# Patient Record
Sex: Male | Born: 1965 | Race: Black or African American | Hispanic: No | Smoking: Former smoker
Health system: Southern US, Community
[De-identification: ages and names within clinical notes are randomized; demographics above are authoritative.]

## PROBLEM LIST (undated history)

## (undated) ENCOUNTER — Ambulatory Visit (HOSPITAL_COMMUNITY): Admission: EM

## (undated) DIAGNOSIS — I509 Heart failure, unspecified: Secondary | ICD-10-CM

## (undated) DIAGNOSIS — I5022 Chronic systolic (congestive) heart failure: Secondary | ICD-10-CM

## (undated) DIAGNOSIS — Q600 Renal agenesis, unilateral: Secondary | ICD-10-CM

## (undated) DIAGNOSIS — N186 End stage renal disease: Secondary | ICD-10-CM

## (undated) DIAGNOSIS — R634 Abnormal weight loss: Secondary | ICD-10-CM

## (undated) DIAGNOSIS — I10 Essential (primary) hypertension: Secondary | ICD-10-CM

## (undated) HISTORY — DX: Heart failure, unspecified: I50.9

---

## 2009-09-23 ENCOUNTER — Emergency Department (HOSPITAL_COMMUNITY): Admission: EM | Admit: 2009-09-23 | Discharge: 2009-09-24 | Payer: Self-pay | Admitting: Emergency Medicine

## 2010-09-04 LAB — POCT I-STAT, CHEM 8
BUN: 24 mg/dL — ABNORMAL HIGH (ref 6–23)
Calcium, Ion: 1.16 mmol/L (ref 1.12–1.32)
Chloride: 107 mEq/L (ref 96–112)
Glucose, Bld: 88 mg/dL (ref 70–99)
HCT: 43 % (ref 39.0–52.0)

## 2010-09-04 LAB — URINE MICROSCOPIC-ADD ON

## 2010-09-04 LAB — URINALYSIS, ROUTINE W REFLEX MICROSCOPIC
Bilirubin Urine: NEGATIVE
Glucose, UA: NEGATIVE mg/dL
Ketones, ur: NEGATIVE mg/dL
pH: 5.5 (ref 5.0–8.0)

## 2019-06-11 ENCOUNTER — Emergency Department (HOSPITAL_COMMUNITY)
Admission: EM | Admit: 2019-06-11 | Discharge: 2019-06-12 | Disposition: A | Discharge status: Home / Self Care | Payer: Self-pay | Attending: Emergency Medicine | Admitting: Emergency Medicine

## 2019-06-11 DIAGNOSIS — J36 Peritonsillar abscess: Secondary | ICD-10-CM | POA: Insufficient documentation

## 2019-06-11 DIAGNOSIS — J029 Acute pharyngitis, unspecified: Secondary | ICD-10-CM

## 2019-06-11 DIAGNOSIS — I10 Essential (primary) hypertension: Secondary | ICD-10-CM | POA: Insufficient documentation

## 2019-06-12 ENCOUNTER — Other Ambulatory Visit: Payer: Self-pay

## 2019-06-12 LAB — GROUP A STREP BY PCR: Group A Strep by PCR: NOT DETECTED

## 2019-06-12 MED ORDER — IBUPROFEN 400 MG PO TABS
800.0000 mg | ORAL_TABLET | Freq: Once | ORAL | Status: AC
  Administered 2019-06-12: 06:00:00 800 mg via ORAL
  Filled 2019-06-12: qty 2

## 2019-06-12 MED ORDER — CLINDAMYCIN HCL 150 MG PO CAPS
450.0000 mg | ORAL_CAPSULE | Freq: Once | ORAL | Status: AC
  Administered 2019-06-12: 06:00:00 450 mg via ORAL
  Filled 2019-06-12: qty 3

## 2019-06-12 MED ORDER — CLINDAMYCIN HCL 150 MG PO CAPS: 450.0000 mg | ORAL_CAPSULE | Freq: Three times a day (TID) | ORAL | 0 refills | Status: DC

## 2019-06-12 NOTE — ED Triage Notes (Signed)
Per pt he started having a sore throat only on left side that started yesterday and has gotten worse today. Pt said no chills, no cough, low grade fever.

## 2019-06-12 NOTE — ED Notes (Signed)
ED Provider at bedside. 

## 2019-06-12 NOTE — ED Provider Notes (Signed)
[facility] EMERGENCY DEPARTMENT Provider Note   CSN: [id number] Arrival date & time: 06/11/19  2333     History Chief Complaint  Patient presents with  . Sore Throat    Phillip Lynch is a 53 y.o. male with a hx of hypertension presents to the Emergency Department complaining of gradual, persistent, progressively worsening left-sided throat pain onset approximately 24 hours ago.  Patient reports trying warm water salt gargle without relief.  He reports it is worsened slightly throughout the day.  He reports the site feels slightly swollen but he is able to eat and drink.  Swallowing is painful but is not difficult.  Patient reports he lost the crown off of his left upper molar several months ago but it has not given him any pain or other symptoms.  No swelling of the gingiva.  Patient denies known sick contacts or Covid contacts.  He reports swallowing makes the symptoms worse and nothing seems to make them better.  He has not taking any medications for pain.  He denies fever, chills, neck pain, neck stiffness, chest pain, nausea, vomiting, diarrhea, weakness, dizziness, syncope.  Patient reports he is not currently medicated for his hypertension as he has not had insurance in some time.  The history is provided by the patient and medical records. No language interpreter was used.       No past medical history on file.  There are no problems to display for this patient.      No family history on file.  Social History   Tobacco Use  . Smoking status: Not on file  Substance Use Topics  . Alcohol use: Not on file  . Drug use: Not on file    Home Medications Prior to Admission medications   Medication Sig Start Date End Date Taking? Authorizing Provider  clindamycin (CLEOCIN) 150 MG capsule Take 3 capsules (450 mg total) by mouth 3 (three) times daily. 06/12/19   Tanijah Morais, Jarrett Soho, PA-C    Allergies    Patient has no allergy information on  record.  Review of Systems   Review of Systems  Constitutional: Negative for chills, fatigue and fever.  HENT: Positive for sore throat. Negative for congestion, dental problem, drooling, ear pain, facial swelling, mouth sores, postnasal drip, rhinorrhea, trouble swallowing and voice change.   Eyes: Negative for pain.  Respiratory: Negative for cough, chest tightness and shortness of breath.   Cardiovascular: Negative for chest pain.  Gastrointestinal: Negative for abdominal pain, nausea and vomiting.  Musculoskeletal: Negative for neck pain and neck stiffness.  Skin: Negative for rash.  Allergic/Immunologic: Negative for immunocompromised state.  Neurological: Negative for facial asymmetry and headaches.  Hematological: Positive for adenopathy.  Psychiatric/Behavioral: The patient is not nervous/anxious.     Physical Exam Updated Vital Signs BP (!) 164/112 (BP Location: Right Arm)   Pulse 97   Temp 99.6 F (37.6 C) (Oral)   Resp 18   SpO2 97%   Physical Exam Vitals and nursing note reviewed.  Constitutional:      General: He is not in acute distress.    Appearance: He is well-developed. He is not diaphoretic.  HENT:     Head: Normocephalic and atraumatic.     Jaw: No trismus.     Right Ear: Tympanic membrane, ear canal and external ear normal.     Left Ear: Tympanic membrane, ear canal and external ear normal.     Nose: Nose normal. No mucosal edema or rhinorrhea.  Mouth/Throat:     Mouth: Mucous membranes are not dry.     Dentition: No dental abscesses.     Pharynx: Uvula midline. Pharyngeal swelling, oropharyngeal exudate and posterior oropharyngeal erythema present. No uvula swelling.     Tonsils: No tonsillar exudate. 1+ on the right. 1+ on the left.      Comments: Uvula midline without swelling.  Retropharyngeal space without swelling.   Eyes:     Conjunctiva/sclera: Conjunctivae normal.  Neck:     Trachea: Phonation normal. No tracheal tenderness.      Meningeal: Brudzinski's sign and Kernig's sign absent.     Comments: Range of motion without pain  No midline or paraspinal tenderness Normal phonation No stridor Handling secretions without difficulty No nuchal rigidity or meningeal signs Cardiovascular:     Rate and Rhythm: Normal rate and regular rhythm.     Pulses:          Radial pulses are 2+ on the right side and 2+ on the left side.  Pulmonary:     Effort: Pulmonary effort is normal. No respiratory distress.     Breath sounds: Normal breath sounds. No stridor. No decreased breath sounds or wheezing.  Abdominal:     General: There is no distension.  Musculoskeletal:        General: Normal range of motion.     Cervical back: Full passive range of motion without pain and normal range of motion. No erythema or rigidity. No spinous process tenderness or muscular tenderness. Normal range of motion.  Lymphadenopathy:     Head:     Right side of head: No submental, submandibular, tonsillar, preauricular, posterior auricular or occipital adenopathy.     Left side of head: Tonsillar adenopathy present. No submental, submandibular, preauricular, posterior auricular or occipital adenopathy.     Cervical: No cervical adenopathy.     Right cervical: No superficial, deep or posterior cervical adenopathy.    Left cervical: No superficial, deep or posterior cervical adenopathy.  Skin:    General: Skin is warm and dry.  Neurological:     Mental Status: He is alert.     Comments: Alert and oriented Moves all extremities without ataxia     ED Results / Procedures / Treatments   Labs (all labs ordered are listed, but only abnormal results are displayed) Labs Reviewed  GROUP A STREP BY PCR    Procedures Procedures (including critical care time)  Medications Ordered in ED Medications  clindamycin (CLEOCIN) capsule 450 mg (has no administration in time range)  ibuprofen (ADVIL) tablet 800 mg (has no administration in time range)     ED Course  I have reviewed the triage vital signs and the nursing notes.  Pertinent labs & imaging results that were available during my care of the patient were reviewed by me and considered in my medical decision making (see chart for details).  Clinical Course as of Jun 11 605  Sun Jun 12, 2019  0605 Hypertension noted.  Patient has a history of same and is not currently medicated given financial constraints.  Discussed importance of close follow-up and establishing primary care follow-up as he is now employed with insurance.  No chest pain or shortness of breath.  No evidence of hypertensive urgency.  BP(!): 164/112 [HM]    Clinical Course User Index [HM] Laurieann Friddle, Gwenlyn Perking   MDM Rules/Calculators/A&P                      Patient  presents to the emergency department with sore throat.  On exam he has erythema and a very mild amount of swelling to the peritonsillar tissue.  No exudate.  Strep test is negative.  Uvula is midline and without swelling.  No swelling to the retropharyngeal space.  Full range of motion of the neck.  Normal phonation and patient is handling secretions without difficulty.  He is able to drink water here in the emergency department and swallow tablets.  Clinical picture is most consistent with peritonsillar cellulitis.  Given the minimal edema, I do not believe he has an abscess which would be amenable to drainage.  This may be early abscess formation.  Patient is afebrile without signs of sepsis.   Given that he is well-appearing and symptoms are mild we will start with clindamycin.  Long discussion with patient about peritonsillar cellulitis and potential for early abscess.  Discussed reasons to return immediately to the emergency department including development of fevers, difficulty swallowing, change in voice, worsening swelling or any other concerns.  Patient does understand that there is potential that the current infection will turn into an abscess  which will need to be drained.   Final Clinical Impression(s) / ED Diagnoses Final diagnoses:  Peritonsillar cellulitis  Sore throat  Hypertension, unspecified type    Rx / DC Orders ED Discharge Orders         Ordered    clindamycin (CLEOCIN) 150 MG capsule  3 times daily     06/12/19 0604           Nguyet Mercer, Jarrett Soho, PA-C 06/12/19 0606    Varney Biles, MD 06/12/19 2315

## 2019-06-12 NOTE — Discharge Instructions (Signed)
1. Medications: Clindamycin, usual home medications 2. Treatment: rest, drink plenty of fluids,  3. Follow Up: Please return to the ER immediately for worsening symptoms, high fevers, difficulty swallowing, voice change or worsening pain.  You were found to be hypertensive today.  Please establish care with a primary care provider to recheck blood pressure and begin medications.

## 2019-07-20 ENCOUNTER — Other Ambulatory Visit: Payer: Self-pay

## 2019-07-20 ENCOUNTER — Inpatient Hospital Stay (HOSPITAL_COMMUNITY)
Admission: EM | Admit: 2019-07-20 | Discharge: 2019-07-22 | DRG: 683 | Disposition: A | Discharge status: Home / Self Care | Payer: Self-pay | Attending: Internal Medicine | Admitting: Internal Medicine

## 2019-07-20 ENCOUNTER — Encounter (HOSPITAL_COMMUNITY): Payer: Self-pay | Admitting: Emergency Medicine

## 2019-07-20 DIAGNOSIS — I160 Hypertensive urgency: Secondary | ICD-10-CM | POA: Diagnosis present

## 2019-07-20 DIAGNOSIS — Q600 Renal agenesis, unilateral: Secondary | ICD-10-CM

## 2019-07-20 DIAGNOSIS — IMO0002 Reserved for concepts with insufficient information to code with codable children: Secondary | ICD-10-CM

## 2019-07-20 DIAGNOSIS — F17200 Nicotine dependence, unspecified, uncomplicated: Secondary | ICD-10-CM | POA: Diagnosis present

## 2019-07-20 DIAGNOSIS — N184 Chronic kidney disease, stage 4 (severe): Secondary | ICD-10-CM

## 2019-07-20 DIAGNOSIS — D539 Nutritional anemia, unspecified: Secondary | ICD-10-CM | POA: Diagnosis present

## 2019-07-20 DIAGNOSIS — N185 Chronic kidney disease, stage 5: Secondary | ICD-10-CM | POA: Diagnosis present

## 2019-07-20 DIAGNOSIS — Z91128 Patient's intentional underdosing of medication regimen for other reason: Secondary | ICD-10-CM

## 2019-07-20 DIAGNOSIS — I10 Essential (primary) hypertension: Secondary | ICD-10-CM | POA: Diagnosis present

## 2019-07-20 DIAGNOSIS — Z888 Allergy status to other drugs, medicaments and biological substances status: Secondary | ICD-10-CM

## 2019-07-20 DIAGNOSIS — D631 Anemia in chronic kidney disease: Secondary | ICD-10-CM | POA: Diagnosis present

## 2019-07-20 DIAGNOSIS — Z20822 Contact with and (suspected) exposure to covid-19: Secondary | ICD-10-CM | POA: Diagnosis present

## 2019-07-20 DIAGNOSIS — E8889 Other specified metabolic disorders: Secondary | ICD-10-CM | POA: Diagnosis present

## 2019-07-20 DIAGNOSIS — Y92009 Unspecified place in unspecified non-institutional (private) residence as the place of occurrence of the external cause: Secondary | ICD-10-CM

## 2019-07-20 DIAGNOSIS — E861 Hypovolemia: Secondary | ICD-10-CM | POA: Diagnosis present

## 2019-07-20 DIAGNOSIS — E872 Acidosis: Secondary | ICD-10-CM | POA: Diagnosis present

## 2019-07-20 DIAGNOSIS — R197 Diarrhea, unspecified: Secondary | ICD-10-CM | POA: Diagnosis present

## 2019-07-20 DIAGNOSIS — Z823 Family history of stroke: Secondary | ICD-10-CM

## 2019-07-20 DIAGNOSIS — I120 Hypertensive chronic kidney disease with stage 5 chronic kidney disease or end stage renal disease: Secondary | ICD-10-CM | POA: Diagnosis present

## 2019-07-20 DIAGNOSIS — N179 Acute kidney failure, unspecified: Principal | ICD-10-CM | POA: Diagnosis present

## 2019-07-20 DIAGNOSIS — T464X6A Underdosing of angiotensin-converting-enzyme inhibitors, initial encounter: Secondary | ICD-10-CM | POA: Diagnosis present

## 2019-07-20 HISTORY — DX: Essential (primary) hypertension: I10

## 2019-07-20 LAB — URINALYSIS, ROUTINE W REFLEX MICROSCOPIC
Bilirubin Urine: NEGATIVE
Glucose, UA: NEGATIVE mg/dL
Ketones, ur: NEGATIVE mg/dL
Nitrite: NEGATIVE
Protein, ur: 100 mg/dL — AB
Specific Gravity, Urine: 1.012 (ref 1.005–1.030)
WBC, UA: >50 WBC/hpf — ABNORMAL HIGH (ref 0–5)
pH: 5 (ref 5.0–8.0)

## 2019-07-20 LAB — COMPREHENSIVE METABOLIC PANEL
ALT: 14 U/L (ref 0–44)
AST: 20 U/L (ref 15–41)
Albumin: 3.7 g/dL (ref 3.5–5.0)
Alkaline Phosphatase: 83 U/L (ref 38–126)
Anion gap: 10 (ref 5–15)
BUN: 45 mg/dL — ABNORMAL HIGH (ref 6–20)
CO2: 18 mmol/L — ABNORMAL LOW (ref 22–32)
Calcium: 9.3 mg/dL (ref 8.9–10.3)
Chloride: 112 mmol/L — ABNORMAL HIGH (ref 98–111)
Creatinine, Ser: 5.86 mg/dL — ABNORMAL HIGH (ref 0.61–1.24)
GFR calc Af Amer: 12 mL/min — ABNORMAL LOW (ref >60)
GFR calc non Af Amer: 10 mL/min — ABNORMAL LOW (ref >60)
Glucose, Bld: 88 mg/dL (ref 70–99)
Potassium: 4.9 mmol/L (ref 3.5–5.1)
Sodium: 140 mmol/L (ref 135–145)
Total Bilirubin: 0.8 mg/dL (ref 0.3–1.2)
Total Protein: 7.4 g/dL (ref 6.5–8.1)

## 2019-07-20 LAB — CBC
HCT: 37.7 % — ABNORMAL LOW (ref 39.0–52.0)
Hemoglobin: 11.5 g/dL — ABNORMAL LOW (ref 13.0–17.0)
MCH: 31.5 pg (ref 26.0–34.0)
MCHC: 30.5 g/dL (ref 30.0–36.0)
MCV: 103.3 fL — ABNORMAL HIGH (ref 80.0–100.0)
Platelets: 177 10*3/uL (ref 150–400)
RBC: 3.65 MIL/uL — ABNORMAL LOW (ref 4.22–5.81)
RDW: 13.7 % (ref 11.5–15.5)
WBC: 4 10*3/uL (ref 4.0–10.5)
nRBC: 0 % (ref 0.0–0.2)

## 2019-07-20 LAB — LIPASE, BLOOD: Lipase: 34 U/L (ref 11–51)

## 2019-07-20 MED ORDER — ONDANSETRON HCL 4 MG/2ML IJ SOLN: 4.0000 mg | Freq: Four times a day (QID) | INTRAMUSCULAR | Status: DC | PRN

## 2019-07-20 MED ORDER — SODIUM CHLORIDE 0.9 % IV BOLUS
1000.0000 mL | Freq: Once | INTRAVENOUS | Status: AC
  Administered 2019-07-20: 20:00:00 1000 mL via INTRAVENOUS

## 2019-07-20 MED ORDER — SODIUM CHLORIDE 0.9% FLUSH: 3.0000 mL | Freq: Once | INTRAVENOUS | Status: DC

## 2019-07-20 MED ORDER — ACETAMINOPHEN 325 MG PO TABS
650.0000 mg | ORAL_TABLET | Freq: Four times a day (QID) | ORAL | Status: DC | PRN
  Administered 2019-07-21: 09:00:00 650 mg via ORAL
  Filled 2019-07-20: qty 2

## 2019-07-20 MED ORDER — HYDRALAZINE HCL 20 MG/ML IJ SOLN
10.0000 mg | INTRAMUSCULAR | Status: DC | PRN
  Administered 2019-07-20 – 2019-07-21 (×2): 10 mg via INTRAVENOUS
  Filled 2019-07-20 (×2): qty 1

## 2019-07-20 MED ORDER — HEPARIN SODIUM (PORCINE) 5000 UNIT/ML IJ SOLN
5000.0000 [IU] | Freq: Three times a day (TID) | INTRAMUSCULAR | Status: DC
  Administered 2019-07-21 – 2019-07-22 (×6): 5000 [IU] via SUBCUTANEOUS
  Filled 2019-07-20 (×6): qty 1

## 2019-07-20 MED ORDER — ACETAMINOPHEN 650 MG RE SUPP: 650.0000 mg | Freq: Four times a day (QID) | RECTAL | Status: DC | PRN

## 2019-07-20 MED ORDER — SODIUM CHLORIDE 0.9 % IV SOLN: INTRAVENOUS | Status: AC

## 2019-07-20 MED ORDER — ONDANSETRON HCL 4 MG PO TABS: 4.0000 mg | ORAL_TABLET | Freq: Four times a day (QID) | ORAL | Status: DC | PRN

## 2019-07-20 NOTE — ED Triage Notes (Signed)
Pt arrives to ED from home with complaints of diarrhea x2 weeks. Patient states he is not having formed stools just loose brown stools. Patient states hes had headaches on and off as well. No known COVID contacts. Patient denies abdominal pain.

## 2019-07-20 NOTE — H&P (Signed)
History and Physical    Phillip Lynch [id number] DOB: 06/15/1966 DOA: 07/20/2019  PCP: Patient, [name]  Patient coming from: Home.  Chief Complaint: Diarrhea weakness.  HPI: Phillip Lynch is a 54 y.o. male with history of hypertension and single kidney presents to the ER after having having persistent diarrhea for the last 2 weeks.  Which has been progressive getting worse but did have a formed stool today.  Also been feeling weak some headaches.  Has had history of single functioning kidney and also has history of hypertension has not been taking his medicines for many months now.  Patient was treated for upper respiratory infection about 4 weeks ago with clindamycin for about a week.  Denies any dysuria or fever chills or shortness of breath or productive cough.  ED Course: In the ER patient was afebrile not hypoxic and labs show creatinine of 5.86 bicarb 18 OFC normal CBC shows microcytic anemia with hemoglobin of 11.5 UA shows features concerning for UTI with moderate leukocytes and 100 protein bacteria few and more than 50 WBC.  Review of Systems: As per HPI, rest all negative.   Past Medical History:  Diagnosis Date  . Hypertension     History reviewed. No pertinent surgical history.   reports that he has been smoking. He has never used smokeless tobacco. He reports current alcohol use. No history on file for drug.  Allergies  Allergen Reactions  . Nsaids Other (See Comments)    Patient was born with only 1 kidney and was told to not take NSAID(s)    Family History  Problem Relation Age of Onset  . Stroke Mother     Prior to Admission medications   Medication Sig Start Date End Date Taking? Authorizing Provider  clindamycin (CLEOCIN) 150 MG capsule Take 3 capsules (450 mg total) by mouth 3 (three) times daily. Patient not taking: Reported on 07/20/2019 06/12/19   Muthersbaugh, Jarrett Soho, PA-C    Physical Exam: Constitutional: Moderately built and nourished.  Vitals:   07/20/19 1544 07/20/19 1900 07/20/19 2015 07/20/19 2100  BP: (!) 180/130 (!) 160/116 (!) 180/115 (!) 166/104  Pulse: (!) 110 89 78 95  Resp: 18   18  Temp: 98.8 F (37.1 C)     TempSrc: Oral     SpO2: 100% 97% 98% 100%   Eyes: Anicteric no pallor. ENMT: No discharge from the ears eyes nose or mouth. Neck: No mass or.  No neck rigidity. Respiratory: No rhonchi or crepitations. Cardiovascular: S1-S2 heard. Abdomen: Soft nontender bowel sounds present. Musculoskeletal: No edema.  No joint effusion. Skin: No rash. Neurologic: Alert awake oriented to time place and person.  Moves all extremities. Psychiatric: Appears normal per normal affect.   Labs on Admission: I have personally reviewed following labs and imaging studies  CBC: Recent Labs  Lab 07/20/19 1645  WBC 4.0  HGB 11.5*  HCT 37.7*  MCV 103.3*  PLT 123XX123   Basic Metabolic Panel: Recent Labs  Lab 07/20/19 1645  NA 140  K 4.9  CL 112*  CO2 18*  GLUCOSE 88  BUN 45*  CREATININE 5.86*  CALCIUM 9.3   GFR: CrCl cannot be calculated (Unknown ideal weight.). Liver Function Tests: Recent Labs  Lab 07/20/19 1645  AST 20  ALT 14  ALKPHOS 83  BILITOT 0.8  PROT 7.4  ALBUMIN 3.7   Recent Labs  Lab 07/20/19 1645  LIPASE 34   No results for input(s): AMMONIA in the last 168 hours. Coagulation Profile:  No results for input(s): INR, PROTIME in the last 168 hours. Cardiac Enzymes: No results for input(s): CKTOTAL, CKMB, CKMBINDEX, TROPONINI in the last 168 hours. BNP (last 3 results) No results for input(s): PROBNP in the last 8760 hours. HbA1C: No results for input(s): HGBA1C in the last 72 hours. CBG: No results for input(s): GLUCAP in the last 168 hours. Lipid Profile: No results for input(s): CHOL, HDL, LDLCALC, TRIG, CHOLHDL, LDLDIRECT in the last 72 hours. Thyroid Function Tests: No results for input(s): TSH, T4TOTAL, FREET4, T3FREE, THYROIDAB in the last 72 hours. Anemia Panel: No  results for input(s): VITAMINB12, FOLATE, FERRITIN, TIBC, IRON, RETICCTPCT in the last 72 hours. Urine analysis:    Component Value Date/Time   COLORURINE YELLOW 07/20/2019 1645   APPEARANCEUR HAZY (A) 07/20/2019 1645   LABSPEC 1.012 07/20/2019 1645   PHURINE 5.0 07/20/2019 1645   GLUCOSEU NEGATIVE 07/20/2019 1645   HGBUR SMALL (A) 07/20/2019 1645   BILIRUBINUR NEGATIVE 07/20/2019 1645   KETONESUR NEGATIVE 07/20/2019 1645   PROTEINUR 100 (A) 07/20/2019 1645   UROBILINOGEN 0.2 09/24/2009 0230   NITRITE NEGATIVE 07/20/2019 1645   LEUKOCYTESUR MODERATE (A) 07/20/2019 1645   Sepsis Labs: @LABRCNTIP (procalcitonin:4,lacticidven:4) )No results found for this or any previous visit (from the past 240 hour(s)).   Radiological Exams on Admission: No results found.    Assessment/Plan Active Problems:   ARF (acute renal failure) (HCC)   Hypertensive urgency    1. Acute on chronic kidney disease with single functioning kidney last creatinine in our system is in 2009 which was 2.8.  Likely worsening renal function could be from the recent diarrhea.  Patient received fluid bolus and I am gently hydrating.  Patient has not taken any NSAIDs as per the patient.  Closely for intake out metabolic panel.  If kidney function does not improve with hydration will need imaging.  UA shows protein and features concerning for UTI. 2. Hypertensive urgency -patient states he has a history of hypertension has not been taking his medicines for many months now.  We will keep patient on as needed IV hydralazine and since patient has a known history of hypertension I kept patient on amlodipine 5 mg.  Follow blood pressure trends. 3. Diarrhea -could be from recent antibiotic intake for upper respiratory tract infection for which patient was given clindamycin.  Patient states he had a formed stools today. 4. Abnormal UA concerning for UTI but patient is asymptomatic and not febrile.  Will check urine cultures. 5.  Macrocytic anemia -follow CBC.   DVT prophylaxis: Heparin. Code Status: Full code. Family Communication: Discussed with patient. Disposition Plan: Home. Consults called: None. Admission status: Observation.   Rise Patience MD [facility] Pager [phone].  If 7PM-7AM, please contact night-coverage [email or web address] Password [facility]  07/20/2019, 10:34 PM

## 2019-07-20 NOTE — ED Provider Notes (Signed)
[facility] EMERGENCY DEPARTMENT Provider Note   CSN: [id number] Arrival date & time: 07/20/19  1536     History Chief Complaint  Patient presents with  . Diarrhea    Phillip Lynch is a 54 y.o. male who presents to ED with a chief complaint of diarrhea.  States for the past 2-week he has had intermittent watery diarrhea which initially improved but then recurred about 4 days ago.  Reports diarrhea anytime he tries to eat or drink anything with multiple episodes noted throughout the day.  He had one episode of nonbloody, nonbilious emesis but denies vomiting otherwise.  Denies any abdominal pain or cramping.  He does have several known Covid exposures at work and he is concerned that he may have the same.  He has not taken any medications to help with his symptoms.  Denies any changes in appetite.  He states that he was born with one kidney but is unsure exactly what his diagnosis was.  He was never told that he had chronic kidney disease.  He admits to noncompliance with his home lisinopril for the past several months.  Denies any shortness of breath, chest pain, fever, bloody stools, anticoagulant use, hematuria.  He does note some dysuria about 3 weeks ago which gradually improved spontaneously.  Denies back pain.  HPI     History reviewed. No pertinent past medical history.  Patient Active Problem List   Diagnosis Date Noted  . ARF (acute renal failure) ([place]) 07/20/2019    History reviewed. No pertinent family history.  Social History   Tobacco Use  . Smoking status: Not on file  Substance Use Topics  . Alcohol use: Not on file  . Drug use: Not on file    Home Medications Prior to Admission medications   Medication Sig Start Date End Date Taking? Authorizing Provider  clindamycin (CLEOCIN) 150 MG capsule Take 3 capsules (450 mg total) by mouth 3 (three) times daily. 06/12/19   Muthersbaugh, Jarrett Soho, PA-C    Allergies    Patient has no known allergies.  Review of Systems   Review of Systems  Constitutional: Negative for appetite change, chills and fever.  HENT: Negative for ear pain, rhinorrhea, sneezing and sore throat.   Eyes: Negative for photophobia and visual disturbance.  Respiratory: Negative for cough, chest tightness, shortness of breath and wheezing.   Cardiovascular: Negative for chest pain and palpitations.  Gastrointestinal: Positive for diarrhea and vomiting. Negative for abdominal pain, blood in stool, constipation and nausea.  Genitourinary: Negative for dysuria, hematuria and urgency.  Musculoskeletal: Negative for myalgias.  Skin: Negative for rash.  Neurological: Negative for dizziness, weakness and light-headedness.    Physical Exam Updated Vital Signs BP (!) 160/116   Pulse 89   Temp 98.8 F (37.1 C) (Oral)   Resp 18   SpO2 97%   Physical Exam Vitals and nursing note reviewed.  Constitutional:      General: He is not in acute distress.    Appearance: He is well-developed.  HENT:     Head: Normocephalic and atraumatic.     Nose: Nose normal.  Eyes:     General: No scleral icterus.       Left eye: No discharge.     Conjunctiva/sclera: Conjunctivae normal.  Cardiovascular:     Rate and Rhythm: Regular rhythm. Tachycardia present.     Heart sounds: Normal heart sounds. No murmur. No friction rub. No gallop.   Pulmonary:     Effort: Pulmonary effort  is normal. No respiratory distress.     Breath sounds: Normal breath sounds.  Abdominal:     General: Bowel sounds are normal. There is no distension.     Palpations: Abdomen is soft.     Tenderness: There is no abdominal tenderness. There is no guarding.  Musculoskeletal:        General: Normal range of motion.     Cervical back: Normal range of motion and neck supple.  Skin:    General: Skin is warm and dry.     Findings: No rash.  Neurological:     Mental Status: He is alert.     Motor: No abnormal muscle tone.     Coordination: Coordination  normal.     ED Results / Procedures / Treatments   Labs (all labs ordered are listed, but only abnormal results are displayed) Labs Reviewed  COMPREHENSIVE METABOLIC PANEL - Abnormal; Notable for the following components:      Result Value   Chloride 112 (*)    CO2 18 (*)    BUN 45 (*)    Creatinine, Ser 5.86 (*)    GFR calc non Af Amer 10 (*)    GFR calc Af Amer 12 (*)    All other components within normal limits  CBC - Abnormal; Notable for the following components:   RBC 3.65 (*)    Hemoglobin 11.5 (*)    HCT 37.7 (*)    MCV 103.3 (*)    All other components within normal limits  URINALYSIS, ROUTINE W REFLEX MICROSCOPIC - Abnormal; Notable for the following components:   APPearance HAZY (*)    Hgb urine dipstick SMALL (*)    Protein, ur 100 (*)    Leukocytes,Ua MODERATE (*)    WBC, UA >50 (*)    Bacteria, UA FEW (*)    All other components within normal limits  SARS CORONAVIRUS 2 (TAT 6-24 HRS)  LIPASE, BLOOD    EKG None  Radiology No results found.  Procedures .Critical Care Performed by: Delia Heady, PA-C Authorized by: Delia Heady, PA-C   Critical care provider statement:    Critical care time (minutes):  35   Critical care was necessary to treat or prevent imminent or life-threatening deterioration of the following conditions:  Renal failure, circulatory failure, endocrine crisis and respiratory failure   Critical care was time spent personally by me on the following activities:  Development of treatment plan with patient or surrogate, discussions with consultants, evaluation of patient's response to treatment, examination of patient, obtaining history from patient or surrogate, ordering and performing treatments and interventions, ordering and review of laboratory studies, pulse oximetry, re-evaluation of patient's condition and review of old charts   I assumed direction of critical care for this patient from another provider in my specialty: no      (including critical care time)  Medications Ordered in ED Medications  sodium chloride flush (NS) 0.9 % injection 3 mL (has no administration in time range)  hydrALAZINE (APRESOLINE) injection 10 mg (has no administration in time range)  sodium chloride 0.9 % bolus 1,000 mL (1,000 mLs Intravenous New Bag/Given 07/20/19 1941)    ED Course  I have reviewed the triage vital signs and the nursing notes.  Pertinent labs & imaging results that were available during my care of the patient were reviewed by me and considered in my medical decision making (see chart for details).    MDM Rules/Calculators/A&P  Bunnie Gillitzer was evaluated in Emergency Department on 07/20/19 for the symptoms described in the history of present illness. He/she was evaluated in the context of the global COVID-19 pandemic, which necessitated consideration that the patient might be at risk for infection with the SARS-CoV-2 virus that causes COVID-19. Institutional protocols and algorithms that pertain to the evaluation of patients at risk for COVID-19 are in a state of rapid change based on information released by regulatory bodies including the CDC and federal and state organizations. These policies and algorithms were followed during the patient's care in the ED.  54 year old male who has one kidney since birth, history of HTN with noncompliance with home lisinopril for several months presenting to the ED for diarrhea.  Diarrhea has been intermittent for the past 2 weeks.  Reports nonbloody stools and denies any current vomiting.  Denies any abdominal pain, discomfort, fever or prior abdominal surgeries.  On exam patient is overall well-appearing.  He is hypertensive, tachycardic which I believe is due to his noncompliance with his antihypertensive.  His abdomen is soft, nontender nondistended.  No CVA tenderness noted.  Work-up here shows creatinine of 5.86, BUN of 45, potassium of 4.9.  Unfortunately we do  not have recent labs on him, labs from 2012 shows a creatinine of 2.8.  He has not seen a PCP in several years and does not know what his baseline creatinine is. I am unsure if this is chronic for him but he is high risk due to having one kidney and his current symptoms.  Patient will need to be admitted for further work-up and management of his kidney disease.  Final Clinical Impression(s) / ED Diagnoses Final diagnoses:  Diarrhea, unspecified type  AKI (acute kidney injury) ([place])    Rx / DC Orders ED Discharge Orders    None      Portions of this note were generated with Dragon dictation software. Dictation errors may occur despite best attempts at proofreading.    Delia Heady, PA-C 07/20/19 1955    Virgel Manifold, MD 07/22/19 [phone]

## 2019-07-21 ENCOUNTER — Inpatient Hospital Stay (HOSPITAL_COMMUNITY): Payer: Self-pay

## 2019-07-21 DIAGNOSIS — N179 Acute kidney failure, unspecified: Secondary | ICD-10-CM | POA: Diagnosis present

## 2019-07-21 DIAGNOSIS — N189 Chronic kidney disease, unspecified: Secondary | ICD-10-CM | POA: Diagnosis present

## 2019-07-21 LAB — RETICULOCYTES
Immature Retic Fract: 12.7 % (ref 2.3–15.9)
RBC.: 3.72 MIL/uL — ABNORMAL LOW (ref 4.22–5.81)
Retic Count, Absolute: 46.9 10*3/uL (ref 19.0–186.0)
Retic Ct Pct: 1.3 % (ref 0.4–3.1)

## 2019-07-21 LAB — PROTEIN / CREATININE RATIO, URINE
Creatinine, Urine: 66.39 mg/dL
Protein Creatinine Ratio: 1.21 mg/mg{Cre} — ABNORMAL HIGH (ref 0.00–0.15)
Total Protein, Urine: 80 mg/dL

## 2019-07-21 LAB — BASIC METABOLIC PANEL
Anion gap: 9 (ref 5–15)
BUN: 40 mg/dL — ABNORMAL HIGH (ref 6–20)
CO2: 20 mmol/L — ABNORMAL LOW (ref 22–32)
Calcium: 8.9 mg/dL (ref 8.9–10.3)
Chloride: 114 mmol/L — ABNORMAL HIGH (ref 98–111)
Creatinine, Ser: 5.22 mg/dL — ABNORMAL HIGH (ref 0.61–1.24)
GFR calc Af Amer: 13 mL/min — ABNORMAL LOW (ref >60)
GFR calc non Af Amer: 12 mL/min — ABNORMAL LOW (ref >60)
Glucose, Bld: 129 mg/dL — ABNORMAL HIGH (ref 70–99)
Potassium: 4.7 mmol/L (ref 3.5–5.1)
Sodium: 143 mmol/L (ref 135–145)

## 2019-07-21 LAB — VITAMIN B12: Vitamin B-12: 317 pg/mL (ref 180–914)

## 2019-07-21 LAB — HIV ANTIBODY (ROUTINE TESTING W REFLEX): HIV Screen 4th Generation wRfx: NONREACTIVE

## 2019-07-21 LAB — CBC
HCT: 35.7 % — ABNORMAL LOW (ref 39.0–52.0)
Hemoglobin: 11 g/dL — ABNORMAL LOW (ref 13.0–17.0)
MCH: 31.3 pg (ref 26.0–34.0)
MCHC: 30.8 g/dL (ref 30.0–36.0)
MCV: 101.7 fL — ABNORMAL HIGH (ref 80.0–100.0)
Platelets: 151 10*3/uL (ref 150–400)
RBC: 3.51 MIL/uL — ABNORMAL LOW (ref 4.22–5.81)
RDW: 13.8 % (ref 11.5–15.5)
WBC: 4.6 10*3/uL (ref 4.0–10.5)
nRBC: 0 % (ref 0.0–0.2)

## 2019-07-21 LAB — IRON AND TIBC
Iron: 70 ug/dL (ref 45–182)
Saturation Ratios: 27 % (ref 17.9–39.5)
TIBC: 263 ug/dL (ref 250–450)
UIBC: 193 ug/dL

## 2019-07-21 LAB — FOLATE: Folate: 16.9 ng/mL (ref >5.9)

## 2019-07-21 LAB — CREATININE, SERUM
Creatinine, Ser: 5.53 mg/dL — ABNORMAL HIGH (ref 0.61–1.24)
GFR calc Af Amer: 12 mL/min — ABNORMAL LOW (ref >60)
GFR calc non Af Amer: 11 mL/min — ABNORMAL LOW (ref >60)

## 2019-07-21 LAB — URINE CULTURE: Culture: NO GROWTH

## 2019-07-21 LAB — HEMOGLOBIN A1C
Hgb A1c MFr Bld: 4.9 % (ref 4.8–5.6)
Mean Plasma Glucose: 93.93 mg/dL

## 2019-07-21 LAB — SARS CORONAVIRUS 2 (TAT 6-24 HRS): SARS Coronavirus 2: NEGATIVE

## 2019-07-21 LAB — FERRITIN: Ferritin: 186 ng/mL (ref 24–336)

## 2019-07-21 MED ORDER — AMLODIPINE BESYLATE 10 MG PO TABS
10.0000 mg | ORAL_TABLET | Freq: Every day | ORAL | Status: DC
  Administered 2019-07-21 – 2019-07-22 (×2): 10 mg via ORAL
  Filled 2019-07-21 (×2): qty 1

## 2019-07-21 MED ORDER — AMLODIPINE BESYLATE 5 MG PO TABS
5.0000 mg | ORAL_TABLET | Freq: Every day | ORAL | Status: DC
  Administered 2019-07-21: 5 mg via ORAL
  Filled 2019-07-21: qty 1

## 2019-07-21 NOTE — Consult Note (Signed)
Phillip Lynch Admit Date: 07/20/2019 07/21/2019 Phillip Lynch Requesting Physician:  Doristine Bosworth MD  Reason for Consult:  Renal Failure  HPI:  6M PMH of solitary kidney (unclear history, longstanding, perhaps from birth?), HTN not on meds presented to ED yesterday with several days of diarrhea (never bloody), poor PO, which had started to improved at time of presentation, and has resolved now  Arrived in [place] area late 2020.  Had ? Sore throat or small absess in peritonsillar region and rec clindamycin, but has been off for several weeks. Was taking Aleve on/off as well.  Found to have [place] up to 5.8.  2011 SCr 2.8.  He hasn't had regular medical care, sounds like did see nephrology in [place] around 2005 or 2006.  No neph care since.  Here UA with 2+ protien, no RBC. Has pyuria, U Cx pending.     BP up, started on amlodipine here. Says no issues with LUTS, has voided since arriving.  No epistaxis, hemoptysis, petechiae/purpurua, arthralgias, F/C, NS, weight loss.      Creatinine, Ser (mg/dL)  Date Value  07/21/2019 5.22 (H)  07/20/2019 5.53 (H)  07/20/2019 5.86 (H)  09/24/2009 2.8 (H)  ] I/Os: I/O last 3 completed shifts: In: 643.3 [P.O.:240; I.V.:403.3] Out: -    ROS NSAIDS: as above IV Contrast no exposure TMP/SMX no exposure Hypotension not present Balance of 12 systems is negative w/ exceptions as above  PMH  Past Medical History:  Diagnosis Date  . Hypertension    PSH History reviewed. No pertinent surgical history. FH  Family History  Problem Relation Age of Onset  . Stroke Mother    [place]  reports that he has been smoking. He has never used smokeless tobacco. He reports current alcohol use. No history on file for drug. Allergies  Allergies  Allergen Reactions  . Nsaids Other (See Comments)    Patient was born with only 1 kidney and was told to not take NSAID(s)   Home medications Prior to Admission medications   Medication Sig Start Date End Date Taking? Authorizing  Provider  clindamycin (CLEOCIN) 150 MG capsule Take 3 capsules (450 mg total) by mouth 3 (three) times daily. Patient not taking: Reported on 07/20/2019 06/12/19   Muthersbaugh, Jarrett Soho, PA-C    Current Medications Scheduled Meds: . amLODipine  10 mg Oral Daily  . heparin  5,000 Units Subcutaneous Q8H   Continuous Infusions: . sodium chloride 100 mL/hr at 07/21/19 1307   PRN Meds:.acetaminophen **OR** acetaminophen, hydrALAZINE, ondansetron **OR** ondansetron (ZOFRAN) IV  CBC Recent Labs  Lab 07/20/19 1645 07/20/19 2233  WBC 4.0 4.6  HGB 11.5* 11.0*  HCT 37.7* 35.7*  MCV 103.3* 101.7*  PLT 177 [id number]   Basic Metabolic Panel Recent Labs  Lab 07/20/19 1645 07/20/19 2233 07/21/19 0610  NA 140  --  143  K 4.9  --  4.7  CL 112*  --  114*  CO2 18*  --  20*  GLUCOSE 88  --  129*  BUN 45*  --  40*  CREATININE 5.86* 5.53* 5.22*  CALCIUM 9.3  --  8.9    Physical Exam  Blood pressure (!) 145/98, pulse (!) 103, temperature 98.6 F (37 C), temperature source Oral, resp. rate 17, height 5\' 9"  (1.753 m), weight 70 kg, SpO2 100 %. GEN: WA/WD ENT: NCAT  EYES: EOMI CV: Regular, nl s1s2 no rub, no murmur PULM: CTAB, nl wob ABD: s/nt/nd +bs SKIN: no rashes/lesions EXT:no LEE Nonfocal, AAO x3   Assessment  46M with solitary kidney, apparent longstanding CKD, presentign with further renal failure of unclear time course.  COuld all be progressive CKD (perhaps HTN and FSGS) but also could be acute. If acute ? ATN from hypovolemia / NSAID use.  Has pyuria w/o hematuria, 2+ proteinuria.  ? AIN.  Not a candidate for biopsy 2/2 solitary kidney.  SCr with slight improvement from admission after hydration.    1. Renal Failure, unclear if progressive CKD or has acute component; no uremia; solitary kidney.  Pyuria and 2+ proteinuria at presentation; unclear significance, no hematuria 2. Solitary Kidney, longstanding 3. HTN, long standing, uncontrolled and was off all meds 4. ? Recent  pharyngitis s/p clindamycin  Plan 1. Cont NS for another 24h, then likely can stop 2. No recent records available to add clarity to time course of renal failure 3. Check [place], UP/C.   4. Check ASO, C3, C4, ANA 5. No NSAIDs, he is aware 6. Daily weights, Daily Renal Panel, Strict I/Os, Avoid nephrotoxins (NSAIDs, judicious IV Contrast)  7. Will follow along   Phillip Lynch  [id number] pgr 07/21/2019, 3:50 PM

## 2019-07-21 NOTE — Progress Notes (Addendum)
PROGRESS NOTE    Phillip Lynch  [id number] DOB: [birth year] DOA: 07/20/2019 PCP: Patient, [name]   Brief Narrative:  HPI: Phillip Lynch is a 54 y.o. male with history of hypertension and single kidney presents to the ER after having having persistent diarrhea for the last 2 weeks.  Which has been progressive getting worse but did have a formed stool today.  Also been feeling weak some headaches.  Has had history of single functioning kidney and also has history of hypertension has not been taking his medicines for many months now.  Patient was treated for upper respiratory infection about 4 weeks ago with clindamycin for about a week.  Denies any dysuria or fever chills or shortness of breath or productive cough.  ED Course: In the ER patient was afebrile not hypoxic and labs show creatinine of 5.86 bicarb 18 OFC normal CBC shows microcytic anemia with hemoglobin of 11.5 UA shows features concerning for UTI with moderate leukocytes and 100 protein bacteria few and more than 50 WBC.  Assessment & Plan:   Active Problems:   ARF (acute renal failure) (HCC)   Hypertensive urgency   Diarrhea: Resolved.  Per patient his last loose bowel movement was 2 days ago.  He had one bowel movement yesterday which was formed.  He has not had any bowel movement in last 24 hours.  Acute on chronic kidney disease stage unknown: Last known creatinine that we have on record was from 2009, that is almost 12 years ago and his creatinine was 2.8.  He presented with creatinine of 5.8 which has improved slightly down to 5.22 today.  Has good urine output.  With him having uncontrolled blood pressure, I suspect that he has hypertensive nephropathy.  We will also check hemoglobin A1c to rule out diabetes.  I have consulted nephrology to see him.  Wonder if he requires renal biopsy.  Hypertensive urgency: Blood pressure elevated but significantly improved compared to his presenting blood pressure.  Was not on any  medications prior to admission.  Will increase amlodipine to 10 mg and continue as needed hydralazine.  DVT prophylaxis: Heparin Code Status: Full code Family Communication:  None present at bedside.  Plan of care discussed with patient in length and he verbalized understanding and agreed with it. Patient is from: Home Disposition Plan: Potential home once work-up completed. Barriers to discharge: Elevated creatinine/consultation pending   Estimated body mass index is 22.79 kg/m as calculated from the following:   Height as of this encounter: 5\' 9"  (1.753 m).   Weight as of this encounter: 70 kg.      Nutritional status:               Consultants:   Neurology  Procedures:   None  Antimicrobials:   None   Subjective: Seen and examined.  He feels much better.  Has not had any bowel movement in last 24 hours.  Objective: Vitals:   07/21/19 0345 07/21/19 0445 07/21/19 0546 07/21/19 0737  BP: (!) 152/100 (!) 174/109 (!) 148/95 (!) 154/100  Pulse: 84 90 98 97  Resp:  19  17  Temp:  98.9 F (37.2 C)  98.2 F (36.8 C)  TempSrc:  Oral  Oral  SpO2: 98% 100%  100%  Weight:   70 kg   Height:   5\' 9"  (1.753 m)     Intake/Output Summary (Last 24 hours) at 07/21/2019 1205 Last data filed at 07/21/2019 0500 Gross per 24 hour  Intake 643.27  ml  Output -  Net 643.27 ml   Filed Weights   07/21/19 0546  Weight: 70 kg    Examination:  General exam: Appears calm and comfortable  Respiratory system: Clear to auscultation. Respiratory effort normal. Cardiovascular system: S1 & S2 heard, RRR. No JVD, murmurs, rubs, gallops or clicks. No pedal edema. Gastrointestinal system: Abdomen is nondistended, soft and nontender. No organomegaly or masses felt. Normal bowel sounds heard. Central nervous system: Alert and oriented. No focal neurological deficits. Extremities: Symmetric 5 x 5 power. Skin: No rashes, lesions or ulcers Psychiatry: Judgement and insight appear  normal. Mood & affect appropriate.    Data Reviewed: I have personally reviewed following labs and imaging studies  CBC: Recent Labs  Lab 07/20/19 1645 07/20/19 2233  WBC 4.0 4.6  HGB 11.5* 11.0*  HCT 37.7* 35.7*  MCV 103.3* 101.7*  PLT 177 [id number]   Basic Metabolic Panel: Recent Labs  Lab 07/20/19 1645 07/20/19 2233 07/21/19 0610  NA 140  --  143  K 4.9  --  4.7  CL 112*  --  114*  CO2 18*  --  20*  GLUCOSE 88  --  129*  BUN 45*  --  40*  CREATININE 5.86* 5.53* 5.22*  CALCIUM 9.3  --  8.9   GFR: Estimated Creatinine Clearance: 16 mL/min (A) (by C-G formula based on SCr of 5.22 mg/dL (H)). Liver Function Tests: Recent Labs  Lab 07/20/19 1645  AST 20  ALT 14  ALKPHOS 83  BILITOT 0.8  PROT 7.4  ALBUMIN 3.7   Recent Labs  Lab 07/20/19 1645  LIPASE 34   No results for input(s): AMMONIA in the last 168 hours. Coagulation Profile: No results for input(s): INR, PROTIME in the last 168 hours. Cardiac Enzymes: No results for input(s): CKTOTAL, CKMB, CKMBINDEX, TROPONINI in the last 168 hours. BNP (last 3 results) No results for input(s): PROBNP in the last 8760 hours. HbA1C: No results for input(s): HGBA1C in the last 72 hours. CBG: No results for input(s): GLUCAP in the last 168 hours. Lipid Profile: No results for input(s): CHOL, HDL, LDLCALC, TRIG, CHOLHDL, LDLDIRECT in the last 72 hours. Thyroid Function Tests: No results for input(s): TSH, T4TOTAL, FREET4, T3FREE, THYROIDAB in the last 72 hours. Anemia Panel: Recent Labs    07/21/19 0610  VITAMINB12 317  FOLATE 16.9  FERRITIN 186  TIBC 263  IRON 70  RETICCTPCT 1.3   Sepsis Labs: No results for input(s): PROCALCITON, LATICACIDVEN in the last 168 hours.  Recent Results (from the past 240 hour(s))  SARS CORONAVIRUS 2 (TAT 6-24 HRS) Nasopharyngeal Nasopharyngeal Swab     Status: None   Collection Time: 07/20/19  7:48 PM   Specimen: Nasopharyngeal Swab  Result Value Ref Range Status   SARS  Coronavirus 2 NEGATIVE NEGATIVE Final    Comment: (NOTE) SARS-CoV-2 target nucleic acids are NOT DETECTED. The SARS-CoV-2 RNA is generally detectable in upper and lower respiratory specimens during the acute phase of infection. Negative results do not preclude SARS-CoV-2 infection, do not rule out co-infections with other pathogens, and should not be used as the sole basis for treatment or other patient management decisions. Negative results must be combined with clinical observations, patient history, and epidemiological information. The expected result is Negative. Fact Sheet for Patients: SugarRoll.be Fact Sheet for Healthcare Providers: https://www.woods-mathews.com/ This test is not yet approved or cleared by the [place] FDA and  has been authorized for detection and/or diagnosis of SARS-CoV-2 by FDA under an Emergency  Use Authorization (EUA). This EUA will remain  in effect (meaning this test can be used) for the duration of the COVID-19 declaration under Section 56 4(b)(1) of the Act, 21 U.S.C. section 360bbb-3(b)(1), unless the authorization is terminated or revoked sooner. Performed at [facility], [place] [address], [place], [place] [address]       Radiology Studies: No results found.  Scheduled Meds: . amLODipine  5 mg Oral Daily  . heparin  5,000 Units Subcutaneous Q8H   Continuous Infusions: . sodium chloride 100 mL/hr at 07/21/19 0453     LOS: 0 days   Time spent: 34 minutes   Darliss Cheney, MD [facility]  07/21/2019, 12:05 PM   To contact the attending provider between 7A-7P or the covering provider during after hours 7P-7A, please log into the web site [email or web address].

## 2019-07-21 NOTE — ED Notes (Signed)
Report given to 5N RN. All questions answered 

## 2019-07-21 NOTE — Plan of Care (Signed)
  Problem: Education: Goal: Knowledge of General Education information will improve Description: Including pain rating scale, medication(s)/side effects and non-pharmacologic comfort measures Outcome: Progressing   Problem: Nutrition: Goal: Adequate nutrition will be maintained Outcome: Progressing   Problem: Activity: Goal: Risk for activity intolerance will decrease Outcome: Progressing   

## 2019-07-22 DIAGNOSIS — IMO0002 Reserved for concepts with insufficient information to code with codable children: Secondary | ICD-10-CM

## 2019-07-22 DIAGNOSIS — Q600 Renal agenesis, unilateral: Secondary | ICD-10-CM

## 2019-07-22 DIAGNOSIS — N179 Acute kidney failure, unspecified: Principal | ICD-10-CM

## 2019-07-22 DIAGNOSIS — I10 Essential (primary) hypertension: Secondary | ICD-10-CM | POA: Diagnosis present

## 2019-07-22 DIAGNOSIS — N189 Chronic kidney disease, unspecified: Secondary | ICD-10-CM

## 2019-07-22 DIAGNOSIS — I160 Hypertensive urgency: Secondary | ICD-10-CM

## 2019-07-22 LAB — COMPREHENSIVE METABOLIC PANEL
ALT: 11 U/L (ref 0–44)
AST: 15 U/L (ref 15–41)
Albumin: 3 g/dL — ABNORMAL LOW (ref 3.5–5.0)
Alkaline Phosphatase: 67 U/L (ref 38–126)
Anion gap: 10 (ref 5–15)
BUN: 39 mg/dL — ABNORMAL HIGH (ref 6–20)
CO2: 20 mmol/L — ABNORMAL LOW (ref 22–32)
Calcium: 8.5 mg/dL — ABNORMAL LOW (ref 8.9–10.3)
Chloride: 112 mmol/L — ABNORMAL HIGH (ref 98–111)
Creatinine, Ser: 4.9 mg/dL — ABNORMAL HIGH (ref 0.61–1.24)
GFR calc Af Amer: 14 mL/min — ABNORMAL LOW (ref >60)
GFR calc non Af Amer: 12 mL/min — ABNORMAL LOW (ref >60)
Glucose, Bld: 97 mg/dL (ref 70–99)
Potassium: 5.2 mmol/L — ABNORMAL HIGH (ref 3.5–5.1)
Sodium: 142 mmol/L (ref 135–145)
Total Bilirubin: 0.7 mg/dL (ref 0.3–1.2)
Total Protein: 6 g/dL — ABNORMAL LOW (ref 6.5–8.1)

## 2019-07-22 LAB — ENA+DNA/DS+ANTICH+CENTRO+JO...
Anti JO-1: <0.2 AI (ref 0.0–0.9)
Centromere Ab Screen: <0.2 AI (ref 0.0–0.9)
Chromatin Ab SerPl-aCnc: <0.2 AI (ref 0.0–0.9)
ENA SM Ab Ser-aCnc: <0.2 AI (ref 0.0–0.9)
Ribonucleic Protein: 6.6 AI — ABNORMAL HIGH (ref 0.0–0.9)
SSA (Ro) (ENA) Antibody, IgG: <0.2 AI (ref 0.0–0.9)
SSB (La) (ENA) Antibody, IgG: <0.2 AI (ref 0.0–0.9)
Scleroderma (Scl-70) (ENA) Antibody, IgG: <0.2 AI (ref 0.0–0.9)
ds DNA Ab: <1 IU/mL (ref 0–9)

## 2019-07-22 LAB — CBC
HCT: 33.9 % — ABNORMAL LOW (ref 39.0–52.0)
Hemoglobin: 10.4 g/dL — ABNORMAL LOW (ref 13.0–17.0)
MCH: 31.2 pg (ref 26.0–34.0)
MCHC: 30.7 g/dL (ref 30.0–36.0)
MCV: 101.8 fL — ABNORMAL HIGH (ref 80.0–100.0)
Platelets: 178 10*3/uL (ref 150–400)
RBC: 3.33 MIL/uL — ABNORMAL LOW (ref 4.22–5.81)
RDW: 13.9 % (ref 11.5–15.5)
WBC: 4.7 10*3/uL (ref 4.0–10.5)
nRBC: 0 % (ref 0.0–0.2)

## 2019-07-22 LAB — ANA W/REFLEX IF POSITIVE: Anti Nuclear Antibody (ANA): POSITIVE — AB

## 2019-07-22 LAB — C4 COMPLEMENT: Complement C4, Body Fluid: 21 mg/dL (ref 12–38)

## 2019-07-22 LAB — ANTISTREPTOLYSIN O TITER: ASO: 281 IU/mL — ABNORMAL HIGH (ref 0.0–200.0)

## 2019-07-22 LAB — C3 COMPLEMENT: C3 Complement: 100 mg/dL (ref 82–167)

## 2019-07-22 MED ORDER — AMLODIPINE BESYLATE 10 MG PO TABS: 10.0000 mg | ORAL_TABLET | Freq: Every day | ORAL | 0 refills | Status: DC

## 2019-07-22 MED ORDER — METOPROLOL TARTRATE 25 MG PO TABS: 12.5000 mg | ORAL_TABLET | Freq: Two times a day (BID) | ORAL | 0 refills | Status: DC

## 2019-07-22 MED ORDER — SODIUM BICARBONATE 650 MG PO TABS: 650.0000 mg | ORAL_TABLET | Freq: Two times a day (BID) | ORAL | 0 refills | Status: AC

## 2019-07-22 MED ORDER — SODIUM BICARBONATE 650 MG PO TABS
650.0000 mg | ORAL_TABLET | Freq: Two times a day (BID) | ORAL | Status: DC
  Administered 2019-07-22: 650 mg via ORAL
  Filled 2019-07-22: qty 1

## 2019-07-22 MED ORDER — METOPROLOL TARTRATE 25 MG PO TABS
25.0000 mg | ORAL_TABLET | Freq: Two times a day (BID) | ORAL | Status: DC
  Administered 2019-07-22: 25 mg via ORAL
  Filled 2019-07-22: qty 1

## 2019-07-22 MED ORDER — HYDRALAZINE HCL 25 MG PO TABS: 25.0000 mg | ORAL_TABLET | Freq: Three times a day (TID) | ORAL | 11 refills | Status: DC

## 2019-07-22 MED ORDER — FERROUS SULFATE 325 (65 FE) MG PO TABS: 325.0000 mg | ORAL_TABLET | Freq: Every day | ORAL | 0 refills | Status: DC

## 2019-07-22 MED ORDER — FERROUS SULFATE 325 (65 FE) MG PO TABS: 325.0000 mg | ORAL_TABLET | Freq: Every day | ORAL | Status: DC

## 2019-07-22 NOTE — Discharge Summary (Signed)
Physician Discharge Summary  Phillip Lynch [id number] DOB: 11/26/1965 DOA: 07/20/2019  PCP: Patient, [name]  Admit date: 07/20/2019 Discharge date: 07/22/2019  Admitted From: home Disposition:  home  Recommendations for Outpatient Follow-up:  1. Follow up with PCP in 4 weeks 2. Follow up with nephrology Dr. Carolin Sicks next week 3. Continue oral hydration at home, monitoring urine output, avoid NSAIDS 4. Control blood pressure, monitor pressure at home daily at the same time of the day without activity  Home Health: none Equipment/Devices: none  Discharge Condition: stable CODE STATUS: full Diet recommendation: renal diet  Brief/Interim Summary: Phillip Lynch a 54 y.o.malewithhistory of hypertension and single kidney presents to the ER after having having persistent diarrhea for the last 2 weeks, associated with poor oral intake and generalized weakness. He has not taken BP meds for months. Patient was treated for upper respiratory infection about 4 weeks ago with clindamycin for about a week.   His baseline Cr 2.8 (CKD stage unknown as there's no GFR on labs and the Cr 2.8 was back in 2011), and Cr was 5.86 on admission, bicarb 18. Grossly normal CBC. Covid test negative. UA suggestive of UTI but urine culture has remained negative. He's also having hypertensive urgency on admission.   Started on IV hydration and nephrology consulted. Cr slightly improving to 4.9 on IVF. Pt has no indication for dialysis. His acute renal insufficiency is likely from volume depletion from diarrhea and poor oral intake. No uremia. Given, lab parameters and ultrasound kidney finding this is chronic progressive CKD.  started on sodium bicarb for metabolic acidosis. He has good oral intake in hospital. No diarrhea here. UA with proteinuria with urine PCR 1.21.  Follow-up pending serology which can be done as outpatient.  Not a candidate for biopsy. Anemia due to CKD, iron sat 27%, start on oral iron. Hb  at goal. For his uncontrolled HTN, started him on Norvasc and lopressor (he's also tachycardic despite volume repletion) and hydralazine.   Nephrology called me today  and stated from nephrology standpoint, pt can be discharged today. Nephrology has made appointment for pt to be seen in clinic next week at [facility] on February 16 Tuesday.  Patient to arrive to the clinic at 9:45 AM. Educated him again not to take NSAIDS and continue oral hydration at home.   Discharge Diagnoses:  Active Problems:   Hypertensive urgency   Acute kidney injury superimposed on chronic kidney disease ([place])   Solitary kidney   Uncontrolled hypertension    Discharge Instructions  Discharge Instructions    Diet - low sodium heart healthy   Complete by: As directed    Discharge instructions   Complete by: As directed    Nephrology follow up next week Home blood pressure monitoring No ibuprofen, Aleve, or Advil Oral hydration well at home, monitoring urine color and output   Increase activity slowly   Complete by: As directed      Allergies as of 07/22/2019      Reactions   Nsaids Other (See Comments)   Patient was born with only 1 kidney and was told to not take NSAID(s)      Medication List    STOP taking these medications   clindamycin 150 MG capsule Commonly known as: CLEOCIN     TAKE these medications   amLODipine 10 MG tablet Commonly known as: NORVASC Take 1 tablet (10 mg total) by mouth daily. Start taking on: July 23, 2019   ferrous sulfate 325 (  65 FE) MG tablet Take 1 tablet (325 mg total) by mouth daily with breakfast. Start taking on: July 23, 2019   hydrALAZINE 25 MG tablet Commonly known as: APRESOLINE Take 1 tablet (25 mg total) by mouth 3 (three) times daily.   metoprolol tartrate 25 MG tablet Commonly known as: LOPRESSOR Take 0.5 tablets (12.5 mg total) by mouth 2 (two) times daily.   sodium bicarbonate 650 MG tablet Take 1 tablet (650 mg total)  by mouth 2 (two) times daily for 7 days.      Follow-up Information    Rosita Fire, MD Follow up in 1 week(s).   Specialties: Nephrology, Internal Medicine Contact information: [place] [address] [phone]          Allergies  Allergen Reactions  . Nsaids Other (See Comments)    Patient was born with only 1 kidney and was told to not take NSAID(s)    Consultations:  Nephrology: Dr. Lawson Radar   Procedures/Studies: US RENAL  Result Date: 07/21/2019 CLINICAL DATA:  Chronic renal disease. EXAM: RENAL / URINARY TRACT ULTRASOUND COMPLETE COMPARISON:  None. FINDINGS: Right Kidney: Not visualized. Left Kidney: Renal measurements: 10.0 x 5.5 x 4.9 cm = volume: 142.0 mL. Increased in echogenicity. No hydronephrosis. Bladder: Appears normal for degree of bladder distention. Other: None. IMPRESSION: The left kidney is increased in echogenicity compatible with chronic medical renal disease. No hydronephrosis. No right kidney is visualized. Electronically Signed   By: Lovey Newcomer M.D.   On: 07/21/2019 18:30      Subjective: pt feels good, good oral intake, good urine output, Cr improves to 4.9 on iv NS. No diarrhea.    Discharge Exam: Vitals:   07/22/19 0736 07/22/19 1522  BP: (!) 153/118 (!) 149/113  Pulse: 95 (!) 109  Resp: 18 18  Temp: 97.6 F (36.4 C) 98.5 F (36.9 C)  SpO2: 100% 100%   Vitals:   07/22/19 0325 07/22/19 0500 07/22/19 0736 07/22/19 1522  BP: (!) 141/91  (!) 153/118 (!) 149/113  Pulse: 90  95 (!) 109  Resp: 19  18 18   Temp: 98.5 F (36.9 C)  97.6 F (36.4 C) 98.5 F (36.9 C)  TempSrc: Oral  Oral Oral  SpO2: 98%  100% 100%  Weight:  68.4 kg    Height:        General: Pt is alert, awake, not in acute distress Cardiovascular: RRR, S1/S2 +, no rubs, no gallops Respiratory: CTA bilaterally, no wheezing, no rhonchi Abdominal: Soft, NT, ND, bowel sounds + Extremities: no edema, no cyanosis    The results of  significant diagnostics from this hospitalization (including imaging, microbiology, ancillary and laboratory) are listed below for reference.     Microbiology: Recent Results (from the past 240 hour(s))  SARS CORONAVIRUS 2 (TAT 6-24 HRS) Nasopharyngeal Nasopharyngeal Swab     Status: None   Collection Time: 07/20/19  7:48 PM   Specimen: Nasopharyngeal Swab  Result Value Ref Range Status   SARS Coronavirus 2 NEGATIVE NEGATIVE Final    Comment: (NOTE) SARS-CoV-2 target nucleic acids are NOT DETECTED. The SARS-CoV-2 RNA is generally detectable in upper and lower respiratory specimens during the acute phase of infection. Negative results do not preclude SARS-CoV-2 infection, do not rule out co-infections with other pathogens, and should not be used as the sole basis for treatment or other patient management decisions. Negative results must be combined with clinical observations, patient history, and epidemiological information. The expected result is  Negative. Fact Sheet for Patients: SugarRoll.be Fact Sheet for Healthcare Providers: https://www.woods-mathews.com/ This test is not yet approved or cleared by the [place] FDA and  has been authorized for detection and/or diagnosis of SARS-CoV-2 by FDA under an Emergency Use Authorization (EUA). This EUA will remain  in effect (meaning this test can be used) for the duration of the COVID-19 declaration under Section 56 4(b)(1) of the Act, 21 U.S.C. section 360bbb-3(b)(1), unless the authorization is terminated or revoked sooner. Performed at [facility], [place] [address] [place], [place] [address]   Culture, Urine     Status: None   Collection Time: 07/20/19 10:40 PM   Specimen: Urine, Random  Result Value Ref Range Status   Specimen Description URINE, RANDOM  Final   Special Requests NONE  Final   Culture   Final    NO GROWTH Performed at [facility], [place] [address]  [place], [place] [address]    Report Status 07/21/2019 FINAL  Final     Labs: BNP (last 3 results) No results for input(s): BNP in the last 8760 hours. Basic Metabolic Panel: Recent Labs  Lab 07/20/19 1645 07/20/19 2233 07/21/19 0610 07/22/19 0250  NA 140  --  143 142  K 4.9  --  4.7 5.2*  CL 112*  --  114* 112*  CO2 18*  --  20* 20*  GLUCOSE 88  --  129* 97  BUN 45*  --  40* 39*  CREATININE 5.86* 5.53* 5.22* 4.90*  CALCIUM 9.3  --  8.9 8.5*   Liver Function Tests: Recent Labs  Lab 07/20/19 1645 07/22/19 0250  AST 20 15  ALT 14 11  ALKPHOS 83 67  BILITOT 0.8 0.7  PROT 7.4 6.0*  ALBUMIN 3.7 3.0*   Recent Labs  Lab 07/20/19 1645  LIPASE 34   No results for input(s): AMMONIA in the last 168 hours. CBC: Recent Labs  Lab 07/20/19 1645 07/20/19 2233 07/22/19 0250  WBC 4.0 4.6 4.7  HGB 11.5* 11.0* 10.4*  HCT 37.7* 35.7* 33.9*  MCV 103.3* 101.7* 101.8*  PLT 177 151 178   Cardiac Enzymes: No results for input(s): CKTOTAL, CKMB, CKMBINDEX, TROPONINI in the last 168 hours. BNP: Invalid input(s): POCBNP CBG: No results for input(s): GLUCAP in the last 168 hours. D-Dimer No results for input(s): DDIMER in the last 72 hours. Hgb A1c Recent Labs    07/21/19 0610  HGBA1C 4.9   Lipid Profile No results for input(s): CHOL, HDL, LDLCALC, TRIG, CHOLHDL, LDLDIRECT in the last 72 hours. Thyroid function studies No results for input(s): TSH, T4TOTAL, T3FREE, THYROIDAB in the last 72 hours.  Invalid input(s): FREET3 Anemia work up Recent Labs    07/21/19 0610  VITAMINB12 317  FOLATE 16.9  FERRITIN 186  TIBC 263  IRON 70  RETICCTPCT 1.3   Urinalysis    Component Value Date/Time   COLORURINE YELLOW 07/20/2019 1645   APPEARANCEUR HAZY (A) 07/20/2019 1645   LABSPEC 1.012 07/20/2019 1645   PHURINE 5.0 07/20/2019 1645   GLUCOSEU NEGATIVE 07/20/2019 1645   HGBUR SMALL (A) 07/20/2019 1645   BILIRUBINUR NEGATIVE 07/20/2019 1645   KETONESUR NEGATIVE 07/20/2019  1645   PROTEINUR 100 (A) 07/20/2019 1645   UROBILINOGEN 0.2 09/24/2009 0230   NITRITE NEGATIVE 07/20/2019 1645   LEUKOCYTESUR MODERATE (A) 07/20/2019 1645   Sepsis Labs Invalid input(s): PROCALCITONIN,  WBC,  LACTICIDVEN Microbiology Recent Results (from the past 240 hour(s))  SARS CORONAVIRUS 2 (TAT 6-24 HRS) Nasopharyngeal  Nasopharyngeal Swab     Status: None   Collection Time: 07/20/19  7:48 PM   Specimen: Nasopharyngeal Swab  Result Value Ref Range Status   SARS Coronavirus 2 NEGATIVE NEGATIVE Final    Comment: (NOTE) SARS-CoV-2 target nucleic acids are NOT DETECTED. The SARS-CoV-2 RNA is generally detectable in upper and lower respiratory specimens during the acute phase of infection. Negative results do not preclude SARS-CoV-2 infection, do not rule out co-infections with other pathogens, and should not be used as the sole basis for treatment or other patient management decisions. Negative results must be combined with clinical observations, patient history, and epidemiological information. The expected result is Negative. Fact Sheet for Patients: SugarRoll.be Fact Sheet for Healthcare Providers: https://www.woods-mathews.com/ This test is not yet approved or cleared by the [place] FDA and  has been authorized for detection and/or diagnosis of SARS-CoV-2 by FDA under an Emergency Use Authorization (EUA). This EUA will remain  in effect (meaning this test can be used) for the duration of the COVID-19 declaration under Section 56 4(b)(1) of the Act, 21 U.S.C. section 360bbb-3(b)(1), unless the authorization is terminated or revoked sooner. Performed at [facility], [place] [address], [place], [place] [address]   Culture, Urine     Status: None   Collection Time: 07/20/19 10:40 PM   Specimen: Urine, Random  Result Value Ref Range Status   Specimen Description URINE, RANDOM  Final   Special Requests NONE  Final   Culture    Final    NO GROWTH Performed at [facility], [place] [address], [place], [place] [address]    Report Status 07/21/2019 FINAL  Final     Time coordinating discharge: 36 min  SIGNED:   Paticia Stack, MD  [facility] 07/22/2019, 4:01 PM Pager   If 7PM-7AM, please contact night-coverage [email or web address] Password [email or web address]

## 2019-07-22 NOTE — Progress Notes (Signed)
**Phillip Phillip** Phillip Phillip  Assessment/ Plan: Pt is a 54 y.o. yo male with history of hypertension not on any medication, solitary kidney CKD admitted with diarrhea and poor oral intake which is improved now.  #AKI on CKD versus progressive CKD stage V: He has solitary echogenic left kidney.  The last serum creatinine level was 2.8 in 2011 now peaked creatinine level of 5.86.  He might have some hemodynamic changes due to diarrhea.  With IV hydration his creatinine level down to 4.9.  Given, lab parameters and ultrasound kidney finding this is chronic progressive CKD.  His symptoms improved and has no uremic symptoms.  No need for dialysis. -Start sodium bicarbonate -He has good oral intake. -UA with proteinuria with urine PCR 1.21.  Follow-up pending serology which can be done as outpatient.  Not a candidate for biopsy. -Okay to discharge from renal perspective.  I have made appointment for him to see me in the clinic at [facility] on February 16 Tuesday.  Patient to arrive to the clinic at 9:45 AM.  I provided the clinic phone number and address to the patient.  #Anemia of CKD: Iron saturation 27%.  Starting oral iron.  Hemoglobin at goal.  #Metabolic acidosis: Start sodium bicarbonate.  #Hypertension: He was not on blood pressure medication before.  Recommend to discharge with amlodipine and hydralazine.  #CKD MBD: Phosphorus and PTH level was not checked.  Since he is going home today, I will check those labs and follow-up visit.  I have discussed with the primary team, Dr. Crisoforo Oxford.  Please call back with question.  Subjective: Seen and examined at bedside.  Patient reports feeling much better.  He denied fever, chills, nausea, vomiting, chest pain, shortness of breath.  No diarrhea.  Eating okay. Objective Vital signs in last 24 hours: Vitals:   07/21/19 2035 07/22/19 0325 07/22/19 0500 07/22/19 0736  BP: (!) 147/97 (!) 141/91  (!) 153/118   Pulse: 98 90  95  Resp: 18 19  18   Temp: 98.2 F (36.8 C) 98.5 F (36.9 C)  97.6 F (36.4 C)  TempSrc: Oral Oral  Oral  SpO2: 99% 98%  100%  Weight:   68.4 kg   Height:       Weight change: -1.6 kg  Intake/Output Summary (Last 24 hours) at 07/22/2019 1442 Last data filed at 07/22/2019 1300 Gross per 24 hour  Intake 600 ml  Output 3500 ml  Net -2900 ml       Labs: Basic Metabolic Panel: Recent Labs  Lab 07/20/19 1645 07/20/19 1645 07/20/19 2233 07/21/19 0610 07/22/19 0250  NA 140  --   --  143 142  K 4.9  --   --  4.7 5.2*  CL 112*  --   --  114* 112*  CO2 18*  --   --  20* 20*  GLUCOSE 88  --   --  129* 97  BUN 45*  --   --  40* 39*  CREATININE 5.86*   < > 5.53* 5.22* 4.90*  CALCIUM 9.3  --   --  8.9 8.5*   < > = values in this interval not displayed.   Liver Function Tests: Recent Labs  Lab 07/20/19 1645 07/22/19 0250  AST 20 15  ALT 14 11  ALKPHOS 83 67  BILITOT 0.8 0.7  PROT 7.4 6.0*  ALBUMIN 3.7 3.0*   Recent Labs  Lab 07/20/19 1645  LIPASE 34   No results for input(s):  AMMONIA in the last 168 hours. CBC: Recent Labs  Lab 07/20/19 1645 07/20/19 2233 07/22/19 0250  WBC 4.0 4.6 4.7  HGB 11.5* 11.0* 10.4*  HCT 37.7* 35.7* 33.9*  MCV 103.3* 101.7* 101.8*  PLT 177 151 178   Cardiac Enzymes: No results for input(s): CKTOTAL, CKMB, CKMBINDEX, TROPONINI in the last 168 hours. CBG: No results for input(s): GLUCAP in the last 168 hours.  Iron Studies:  Recent Labs    07/21/19 0610  IRON 70  TIBC 263  FERRITIN 186   Studies/Results: US RENAL  Result Date: 07/21/2019 CLINICAL DATA:  Chronic renal disease. EXAM: RENAL / URINARY TRACT ULTRASOUND COMPLETE COMPARISON:  None. FINDINGS: Right Kidney: Not visualized. Left Kidney: Renal measurements: 10.0 x 5.5 x 4.9 cm = volume: 142.0 mL. Increased in echogenicity. No hydronephrosis. Bladder: Appears normal for degree of bladder distention. Other: None. IMPRESSION: The left kidney is increased in  echogenicity compatible with chronic medical renal disease. No hydronephrosis. No right kidney is visualized. Electronically Signed   By: Lovey Newcomer M.D.   On: 07/21/2019 18:30    Medications: Infusions:   Scheduled Medications: . amLODipine  10 mg Oral Daily  . heparin  5,000 Units Subcutaneous Q8H    have reviewed scheduled and prn medications.  Physical Exam: General:NAD, comfortable Heart:RRR, s1s2 nl Lungs:clear b/l, no crackle Abdomen:soft, Non-tender, non-distended Extremities:No edema Neurology: Alert, awake, following commands.  Baylin Cabal Tanna Furry 07/22/2019,2:42 PM  LOS: 1 day  Pager: [phone]

## 2019-07-22 NOTE — Plan of Care (Signed)
  Problem: Education: Goal: Knowledge of General Education information will improve Description Including pain rating scale, medication(s)/side effects and non-pharmacologic comfort measures Outcome: Progressing   Problem: Health Behavior/Discharge Planning: Goal: Ability to manage health-related needs will improve Outcome: Progressing   

## 2019-07-22 NOTE — Progress Notes (Signed)
Pt IV removed and AVS reviewed, all questions answered and pt was wheeled down to front entrance.

## 2019-07-23 LAB — PARATHYROID HORMONE, INTACT (NO CA): PTH: 105 pg/mL — ABNORMAL HIGH (ref 15–65)

## 2021-05-06 ENCOUNTER — Emergency Department (HOSPITAL_COMMUNITY): Payer: Medicaid Other

## 2021-05-06 ENCOUNTER — Observation Stay (HOSPITAL_COMMUNITY): Payer: Medicaid Other

## 2021-05-06 ENCOUNTER — Inpatient Hospital Stay (HOSPITAL_COMMUNITY)
Admission: EM | Admit: 2021-05-06 | Discharge: 2021-05-15 | DRG: 673 | Disposition: A | Discharge status: Home / Self Care | Payer: Medicaid Other | Attending: Internal Medicine | Admitting: Internal Medicine

## 2021-05-06 ENCOUNTER — Encounter (HOSPITAL_COMMUNITY): Payer: Self-pay | Admitting: Emergency Medicine

## 2021-05-06 DIAGNOSIS — Z681 Body mass index (BMI) 19 or less, adult: Secondary | ICD-10-CM

## 2021-05-06 DIAGNOSIS — Z823 Family history of stroke: Secondary | ICD-10-CM

## 2021-05-06 DIAGNOSIS — I429 Cardiomyopathy, unspecified: Secondary | ICD-10-CM

## 2021-05-06 DIAGNOSIS — N189 Chronic kidney disease, unspecified: Secondary | ICD-10-CM | POA: Diagnosis present

## 2021-05-06 DIAGNOSIS — E872 Acidosis, unspecified: Secondary | ICD-10-CM | POA: Diagnosis present

## 2021-05-06 DIAGNOSIS — R519 Headache, unspecified: Secondary | ICD-10-CM | POA: Diagnosis not present

## 2021-05-06 DIAGNOSIS — E43 Unspecified severe protein-calorie malnutrition: Secondary | ICD-10-CM | POA: Insufficient documentation

## 2021-05-06 DIAGNOSIS — N2581 Secondary hyperparathyroidism of renal origin: Secondary | ICD-10-CM | POA: Diagnosis present

## 2021-05-06 DIAGNOSIS — I132 Hypertensive heart and chronic kidney disease with heart failure and with stage 5 chronic kidney disease, or end stage renal disease: Secondary | ICD-10-CM

## 2021-05-06 DIAGNOSIS — Z841 Family history of disorders of kidney and ureter: Secondary | ICD-10-CM

## 2021-05-06 DIAGNOSIS — Z818 Family history of other mental and behavioral disorders: Secondary | ICD-10-CM

## 2021-05-06 DIAGNOSIS — I10 Essential (primary) hypertension: Secondary | ICD-10-CM | POA: Diagnosis present

## 2021-05-06 DIAGNOSIS — R9431 Abnormal electrocardiogram [ECG] [EKG]: Secondary | ICD-10-CM | POA: Diagnosis present

## 2021-05-06 DIAGNOSIS — J101 Influenza due to other identified influenza virus with other respiratory manifestations: Secondary | ICD-10-CM | POA: Diagnosis present

## 2021-05-06 DIAGNOSIS — D61818 Other pancytopenia: Secondary | ICD-10-CM | POA: Diagnosis not present

## 2021-05-06 DIAGNOSIS — I5020 Unspecified systolic (congestive) heart failure: Secondary | ICD-10-CM

## 2021-05-06 DIAGNOSIS — N185 Chronic kidney disease, stage 5: Secondary | ICD-10-CM | POA: Diagnosis present

## 2021-05-06 DIAGNOSIS — I5021 Acute systolic (congestive) heart failure: Secondary | ICD-10-CM | POA: Diagnosis present

## 2021-05-06 DIAGNOSIS — F1210 Cannabis abuse, uncomplicated: Secondary | ICD-10-CM | POA: Diagnosis present

## 2021-05-06 DIAGNOSIS — I5022 Chronic systolic (congestive) heart failure: Secondary | ICD-10-CM

## 2021-05-06 DIAGNOSIS — Q600 Renal agenesis, unilateral: Secondary | ICD-10-CM

## 2021-05-06 DIAGNOSIS — F1910 Other psychoactive substance abuse, uncomplicated: Secondary | ICD-10-CM | POA: Diagnosis present

## 2021-05-06 DIAGNOSIS — F1490 Cocaine use, unspecified, uncomplicated: Secondary | ICD-10-CM | POA: Diagnosis present

## 2021-05-06 DIAGNOSIS — I472 Ventricular tachycardia, unspecified: Secondary | ICD-10-CM | POA: Diagnosis not present

## 2021-05-06 DIAGNOSIS — Z20822 Contact with and (suspected) exposure to covid-19: Secondary | ICD-10-CM | POA: Diagnosis present

## 2021-05-06 DIAGNOSIS — R3129 Other microscopic hematuria: Secondary | ICD-10-CM | POA: Diagnosis present

## 2021-05-06 DIAGNOSIS — I5042 Chronic combined systolic (congestive) and diastolic (congestive) heart failure: Secondary | ICD-10-CM

## 2021-05-06 DIAGNOSIS — R634 Abnormal weight loss: Secondary | ICD-10-CM | POA: Diagnosis present

## 2021-05-06 DIAGNOSIS — D631 Anemia in chronic kidney disease: Secondary | ICD-10-CM | POA: Diagnosis present

## 2021-05-06 DIAGNOSIS — N186 End stage renal disease: Secondary | ICD-10-CM

## 2021-05-06 DIAGNOSIS — F17290 Nicotine dependence, other tobacco product, uncomplicated: Secondary | ICD-10-CM | POA: Diagnosis present

## 2021-05-06 DIAGNOSIS — J09X2 Influenza due to identified novel influenza A virus with other respiratory manifestations: Secondary | ICD-10-CM | POA: Diagnosis present

## 2021-05-06 DIAGNOSIS — N179 Acute kidney failure, unspecified: Principal | ICD-10-CM | POA: Diagnosis present

## 2021-05-06 DIAGNOSIS — Z992 Dependence on renal dialysis: Secondary | ICD-10-CM

## 2021-05-06 DIAGNOSIS — Z888 Allergy status to other drugs, medicaments and biological substances status: Secondary | ICD-10-CM

## 2021-05-06 DIAGNOSIS — F1990 Other psychoactive substance use, unspecified, uncomplicated: Secondary | ICD-10-CM

## 2021-05-06 DIAGNOSIS — D539 Nutritional anemia, unspecified: Secondary | ICD-10-CM | POA: Diagnosis present

## 2021-05-06 DIAGNOSIS — I43 Cardiomyopathy in diseases classified elsewhere: Secondary | ICD-10-CM | POA: Diagnosis present

## 2021-05-06 DIAGNOSIS — Z5900 Homelessness unspecified: Secondary | ICD-10-CM

## 2021-05-06 DIAGNOSIS — E875 Hyperkalemia: Secondary | ICD-10-CM | POA: Diagnosis present

## 2021-05-06 HISTORY — DX: Renal agenesis, unilateral: Q60.0

## 2021-05-06 HISTORY — DX: Abnormal weight loss: R63.4

## 2021-05-06 LAB — CBC
HCT: 38.2 % — ABNORMAL LOW (ref 39.0–52.0)
HCT: 33.2 % — ABNORMAL LOW (ref 39.0–52.0)
Hemoglobin: 11.9 g/dL — ABNORMAL LOW (ref 13.0–17.0)
Hemoglobin: 10.5 g/dL — ABNORMAL LOW (ref 13.0–17.0)
MCH: 32.1 pg (ref 26.0–34.0)
MCH: 32.1 pg (ref 26.0–34.0)
MCHC: 31.2 g/dL (ref 30.0–36.0)
MCHC: 31.6 g/dL (ref 30.0–36.0)
MCV: 103 fL — ABNORMAL HIGH (ref 80.0–100.0)
MCV: 101.5 fL — ABNORMAL HIGH (ref 80.0–100.0)
Platelets: 153 10*3/uL (ref 150–400)
Platelets: 132 10*3/uL — ABNORMAL LOW (ref 150–400)
RBC: 3.71 MIL/uL — ABNORMAL LOW (ref 4.22–5.81)
RBC: 3.27 MIL/uL — ABNORMAL LOW (ref 4.22–5.81)
RDW: 13.8 % (ref 11.5–15.5)
RDW: 13.5 % (ref 11.5–15.5)
WBC: 4.4 10*3/uL (ref 4.0–10.5)
WBC: 4.6 10*3/uL (ref 4.0–10.5)
nRBC: 0 % (ref 0.0–0.2)
nRBC: 0 % (ref 0.0–0.2)

## 2021-05-06 LAB — PROTEIN / CREATININE RATIO, URINE
Creatinine, Urine: 47.7 mg/dL
Protein Creatinine Ratio: 2.89 mg/mg{Cre} — ABNORMAL HIGH (ref 0.00–0.15)
Total Protein, Urine: 138 mg/dL

## 2021-05-06 LAB — URINALYSIS, ROUTINE W REFLEX MICROSCOPIC
Bilirubin Urine: NEGATIVE
Glucose, UA: NEGATIVE mg/dL
Ketones, ur: 5 mg/dL — AB
Nitrite: NEGATIVE
Protein, ur: 100 mg/dL — AB
Specific Gravity, Urine: 1.013 (ref 1.005–1.030)
pH: 5 (ref 5.0–8.0)

## 2021-05-06 LAB — RETICULOCYTES
Immature Retic Fract: 11.3 % (ref 2.3–15.9)
RBC.: 3.27 MIL/uL — ABNORMAL LOW (ref 4.22–5.81)
Retic Count, Absolute: 28.8 10*3/uL (ref 19.0–186.0)
Retic Ct Pct: 0.9 % (ref 0.4–3.1)

## 2021-05-06 LAB — BASIC METABOLIC PANEL
Anion gap: 13 (ref 5–15)
Anion gap: 10 (ref 5–15)
BUN: 60 mg/dL — ABNORMAL HIGH (ref 6–20)
BUN: 60 mg/dL — ABNORMAL HIGH (ref 6–20)
CO2: 16 mmol/L — ABNORMAL LOW (ref 22–32)
CO2: 16 mmol/L — ABNORMAL LOW (ref 22–32)
Calcium: 9.4 mg/dL (ref 8.9–10.3)
Calcium: 7.8 mg/dL — ABNORMAL LOW (ref 8.9–10.3)
Chloride: 107 mmol/L (ref 98–111)
Chloride: 110 mmol/L (ref 98–111)
Creatinine, Ser: 7.48 mg/dL — ABNORMAL HIGH (ref 0.61–1.24)
Creatinine, Ser: 6.71 mg/dL — ABNORMAL HIGH (ref 0.61–1.24)
GFR, Estimated: 8 mL/min — ABNORMAL LOW (ref >60)
GFR, Estimated: 9 mL/min — ABNORMAL LOW (ref >60)
Glucose, Bld: 95 mg/dL (ref 70–99)
Glucose, Bld: 95 mg/dL (ref 70–99)
Potassium: 5.5 mmol/L — ABNORMAL HIGH (ref 3.5–5.1)
Potassium: 4.7 mmol/L (ref 3.5–5.1)
Sodium: 136 mmol/L (ref 135–145)
Sodium: 136 mmol/L (ref 135–145)

## 2021-05-06 LAB — RESP PANEL BY RT-PCR (FLU A&B, COVID) ARPGX2
Influenza A by PCR: POSITIVE — AB
Influenza B by PCR: NEGATIVE
SARS Coronavirus 2 by RT PCR: NEGATIVE

## 2021-05-06 LAB — PHOSPHORUS: Phosphorus: 4.7 mg/dL — ABNORMAL HIGH (ref 2.5–4.6)

## 2021-05-06 LAB — CREATININE, SERUM
Creatinine, Ser: 6.76 mg/dL — ABNORMAL HIGH (ref 0.61–1.24)
GFR, Estimated: 9 mL/min — ABNORMAL LOW (ref >60)

## 2021-05-06 LAB — LACTIC ACID, PLASMA
Lactic Acid, Venous: 1 mmol/L (ref 0.5–1.9)
Lactic Acid, Venous: 1.2 mmol/L (ref 0.5–1.9)

## 2021-05-06 LAB — HEPATIC FUNCTION PANEL
ALT: 23 U/L (ref 0–44)
AST: 25 U/L (ref 15–41)
Albumin: 3.4 g/dL — ABNORMAL LOW (ref 3.5–5.0)
Alkaline Phosphatase: 68 U/L (ref 38–126)
Bilirubin, Direct: 0.1 mg/dL (ref 0.0–0.2)
Indirect Bilirubin: 0.4 mg/dL (ref 0.3–0.9)
Total Bilirubin: 0.5 mg/dL (ref 0.3–1.2)
Total Protein: 6 g/dL — ABNORMAL LOW (ref 6.5–8.1)

## 2021-05-06 LAB — LIPASE, BLOOD: Lipase: 132 U/L — ABNORMAL HIGH (ref 11–51)

## 2021-05-06 LAB — MAGNESIUM: Magnesium: 1.8 mg/dL (ref 1.7–2.4)

## 2021-05-06 LAB — FERRITIN: Ferritin: 426 ng/mL — ABNORMAL HIGH (ref 24–336)

## 2021-05-06 LAB — HIV ANTIBODY (ROUTINE TESTING W REFLEX): HIV Screen 4th Generation wRfx: NONREACTIVE

## 2021-05-06 LAB — SODIUM, URINE, RANDOM: Sodium, Ur: 76 mmol/L

## 2021-05-06 LAB — CBG MONITORING, ED: Glucose-Capillary: 93 mg/dL (ref 70–99)

## 2021-05-06 MED ORDER — BENZONATATE 100 MG PO CAPS: 200.0000 mg | ORAL_CAPSULE | Freq: Two times a day (BID) | ORAL | Status: DC | PRN

## 2021-05-06 MED ORDER — SODIUM BICARBONATE 650 MG PO TABS
650.0000 mg | ORAL_TABLET | Freq: Two times a day (BID) | ORAL | Status: DC
  Administered 2021-05-06 – 2021-05-12 (×14): 650 mg via ORAL
  Filled 2021-05-06 (×14): qty 1

## 2021-05-06 MED ORDER — OSELTAMIVIR PHOSPHATE 30 MG PO CAPS
30.0000 mg | ORAL_CAPSULE | ORAL | Status: AC
  Administered 2021-05-06 – 2021-05-08 (×2): 30 mg via ORAL
  Filled 2021-05-06 (×2): qty 1

## 2021-05-06 MED ORDER — ACETAMINOPHEN 325 MG PO TABS
650.0000 mg | ORAL_TABLET | Freq: Four times a day (QID) | ORAL | Status: DC | PRN
  Administered 2021-05-11 – 2021-05-13 (×4): 650 mg via ORAL
  Filled 2021-05-06 (×4): qty 2

## 2021-05-06 MED ORDER — ACETAMINOPHEN 650 MG RE SUPP: 650.0000 mg | Freq: Four times a day (QID) | RECTAL | Status: DC | PRN

## 2021-05-06 MED ORDER — LABETALOL HCL 5 MG/ML IV SOLN
10.0000 mg | Freq: Once | INTRAVENOUS | Status: AC
  Administered 2021-05-06: 10 mg via INTRAVENOUS
  Filled 2021-05-06: qty 4

## 2021-05-06 MED ORDER — ONDANSETRON HCL 4 MG/2ML IJ SOLN: 4.0000 mg | Freq: Four times a day (QID) | INTRAMUSCULAR | Status: DC | PRN

## 2021-05-06 MED ORDER — AMLODIPINE BESYLATE 10 MG PO TABS
10.0000 mg | ORAL_TABLET | Freq: Every day | ORAL | Status: DC
Start: 2021-05-06 — End: 2021-05-07
  Administered 2021-05-06 – 2021-05-07 (×2): 10 mg via ORAL
  Filled 2021-05-06: qty 1
  Filled 2021-05-06: qty 2

## 2021-05-06 MED ORDER — HEPARIN SODIUM (PORCINE) 5000 UNIT/ML IJ SOLN
5000.0000 [IU] | Freq: Three times a day (TID) | INTRAMUSCULAR | Status: DC
  Administered 2021-05-06 – 2021-05-12 (×14): 5000 [IU] via SUBCUTANEOUS
  Filled 2021-05-06 (×20): qty 1

## 2021-05-06 MED ORDER — GUAIFENESIN ER 600 MG PO TB12
600.0000 mg | ORAL_TABLET | Freq: Two times a day (BID) | ORAL | Status: DC
  Administered 2021-05-06 – 2021-05-15 (×18): 600 mg via ORAL
  Filled 2021-05-06 (×18): qty 1

## 2021-05-06 MED ORDER — SODIUM ZIRCONIUM CYCLOSILICATE 5 G PO PACK
5.0000 g | PACK | Freq: Once | ORAL | Status: AC
  Administered 2021-05-06: 5 g via ORAL
  Filled 2021-05-06: qty 1

## 2021-05-06 MED ORDER — ONDANSETRON HCL 4 MG PO TABS: 4.0000 mg | ORAL_TABLET | Freq: Four times a day (QID) | ORAL | Status: DC | PRN

## 2021-05-06 MED ORDER — STERILE WATER FOR INJECTION IV SOLN
INTRAVENOUS | Status: AC
  Filled 2021-05-06 (×2): qty 1000

## 2021-05-06 MED ORDER — LABETALOL HCL 5 MG/ML IV SOLN
5.0000 mg | INTRAVENOUS | Status: DC | PRN
  Administered 2021-05-06 (×2): 5 mg via INTRAVENOUS
  Filled 2021-05-06 (×2): qty 4

## 2021-05-06 MED ORDER — SODIUM CHLORIDE 0.9 % IV SOLN
2.0000 g | Freq: Once | INTRAVENOUS | Status: DC
  Administered 2021-05-06: 2 g via INTRAVENOUS
  Filled 2021-05-06: qty 20

## 2021-05-06 MED ORDER — SODIUM CHLORIDE 0.9 % IV BOLUS
1000.0000 mL | Freq: Once | INTRAVENOUS | Status: AC
  Administered 2021-05-06: 1000 mL via INTRAVENOUS

## 2021-05-06 NOTE — Hospital Course (Addendum)
Patient Summary  Phillip Lynch is a 55 year old male with a history of hypertension, congenital unilateral renal agenesis, and CKD who presented to the ED on 11/21 with acute onset URI symptoms admitted for influenza A, progression of CKD, and new onset HFrEF.   ED Course Patient presented with 3 days of URI symptoms and BP of 183/135. RPP positive for influenza A. CMP notable for hyperkalemia of 5.5, Cr 7.48, BUN 60, GFR 8, and metabolic acidosis with CO2 of 16. UA with microscopic hematuria, proteinuria, few bacteria. HIV and RPR testing added at patient request. Lactic acid wnl. EKG showed biatrial enlargement and RVH. CXR with cardiomegaly, no acute consolidations. Patient given Ceftriaxone, Lokelma x1, fluid bolus, and labetalol. Blood and urine cultures were obtained and nephrology was consulted.   #Influenza A Patient was started on Tamiflu 30mg  renally dosed qod x 5 days, as well as symptomatic care with Tylenol, Guaifenesin, and Tessalon Perles. Completed Tamiflu course and symptomatically improved, although cough lingered.   #HTN  Patient was started on Amlodipine 10mg  po daily and Metoprolol 12.5mg  po BID for elevated BP. Patient was switched to Hydralazine 50mg  TID and started on Lasix per cardiology recs, although lasix was discontinued in the setting of worsening kidney function. Metoprolol was also transitioned to carvedilol. At time of discharge, BP was stable, although still hypertensive.  Patient was continued on carvedilol 12.5 mg bid, hydralazine 50 mg tid, and isordil 10 mg tid. Will have follow up with the [facility] in 1 week, and recommended to follow up with nephrology in 1 week as well.   #Acute on chronic kidney disease  #Solitary kidney  #Microscopic hematuria  #Hyperkalemia, resolved  #Metabolic acidosis, resolved Renal ultrasound with significant atrophy compared to prior study in Feb 2021. Likely progression of CKD to stage V. Repeat BMP showed K+ wnl, CO2 16>21, Cr 6.8, GFR  9, BUN 64. No urgent indications for HD but likely approaching ESRD and will need HD in the next few months. Nephrology arranged for vein mapping and vascular was consulted for AV fistula placement. Rechecked ANA which was positive, with RNP still positive, as well. Kappa/lambda free light chains elevated, with ratio slightly elevated at 1.79. SPEP negative. AV fistula was placed on 11/28. K+ elevated to 5.8 at time of discharge, patient given a 10 day supply of Lokelma and instructed to take daily. Patient recommended to make a follow up appointment with Dr. Posey Pronto, nephrology, in 1 week for a hospital follow up and to recheck kidney function/electrolytes.   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 Echo revealed EF 20-25%, moderate LVH, and grade I diastolic dysfunction. Bright myocardium with LVH and relatively preserved apical strain suggests the possibility of  cardiac amyloidosis. PYP scan to evaluate for amyloidosis was negative, thus ruling out cardiac amyloidosis. Myeloma workup also negative, making amyloidosis unlikely, as well. Patient started on hydralazine 25 mg tid, isordil 10 mg tid, carvedilol 12.5 mg bid, and lasix 20 mg daily. Lasix was discontinued due to rising creatinine/euvolemic exam and creatinine improved, and remained stable after discontinuation of lasix. Will require follow up with cardiology in the future, as well.  #Macrocytic anemia  Initial Hgb of 11.9 with MCV 103. Folate and B12 wnl. Iron 222, 88% saturation. 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 Received 2 IV iron infusions and started on Aranesp injection by nephrology.   #Unintentional  weight loss  Patient with 30lb weight loss in past 6 months. HIV negative, TSH wnl. Patient denies dysphagia, abdominal pain, hematuria, melena, chronic constipation, and constitutional  symptoms. No family history of cancer. Has never had a colonoscopy. Weight loss likely due to CKD and HFrEF. We have arranged PCP follow-[facility] with [facility] on 05/22/21 with Dr. Alfonse Spruce.   #SDOH  Patient currently uninsured, lacking transportation, and facing housing insecurity since early summer. Case management was consulted during his stay and referral made for Medicaid evaluation. Patient had been in contact with the [facility] and was discharged to [facility] with a hotel room through the [facility] program.

## 2021-05-06 NOTE — H&P (Addendum)
Date: 05/06/2021               Patient Name:  Phillip Lynch MRN: [id number]  DOB: [birth year] Age / Sex: 55 y.o., male   PCP: Patient, [name] (Inactive)         Medical Service: Internal Medicine Teaching Service         Attending Physician: Dr. Velna Ochs, MD    First Contact: Buddy Duty, DO Pager: [phone]  Second Contact: Gaylan Gerold, DO Pager: Tenny Craw       After Hours (After 5p/  First Contact Pager: [phone]  weekends / holidays): Second Contact Pager: [phone]   SUBJECTIVE  Chief Complaint: Upper Respiratory Symptoms   History of Present Illness: Phillip Lynch is a 55 y.o. male with a pertinent PMH of solitary kidney, HTN, who presents to [facility] with Upper 3respiraotry symptoms.  Patient states that he developed acute onset of upper respiratory symptoms starting on Saturday. He endorsed fever, chills, body aches, nonbloody diarrhea, and minimally productive cough.  He denies any significant chest pain or shortness of breath.  He states that he has a history of solitary kidney.  He has not have any outpatient providers and has not seen an outpatient nephrologist.  He denies any decreased urinary output, dysuria, hematuria, urinary discharge.  He does state that he has frequent nocturia of 3-4 episodes of nighttime urination with symptoms of not completely voiding and urinary dribbling.  He states that he has a longstanding history of hypertension for which she does not take any medications currently.  He denies any new or recent changes in medications, recent travel, sick contacts, substance use/IV drug use, ETOH use.    Medications: No current facility-administered medications on file prior to encounter.   Current Outpatient Medications on File Prior to Encounter  Medication Sig Dispense Refill   amLODipine (NORVASC) 10 MG tablet Take 1 tablet (10 mg total) by mouth daily. 30 tablet 0   ferrous sulfate 325 (65 FE) MG tablet Take 1 tablet (325 mg total) by mouth  daily with breakfast. 30 tablet 0   hydrALAZINE (APRESOLINE) 25 MG tablet Take 1 tablet (25 mg total) by mouth 3 (three) times daily. 120 tablet 11   metoprolol tartrate (LOPRESSOR) 25 MG tablet Take 0.5 tablets (12.5 mg total) by mouth 2 (two) times daily. 30 tablet 0    Past Medical History: Past Medical History:  Diagnosis Date   Hypertension     Social:  Lives - [place] Occupation - Landscaping Support - Live alone, but family in [place].  Level of function - independent PCP - none Substance use - occasionally drinks ETOH and smokes Cigars  Family History: Family History  Problem Relation Age of Onset   Stroke Mother     Allergies: Allergies as of 05/06/2021 - Review Complete 05/06/2021  Allergen Reaction Noted   Nsaids Other (See Comments) 07/20/2019    Review of Systems: A complete ROS was negative except as per HPI.   OBJECTIVE:  Physical Exam: Blood pressure (!) 179/126, pulse 93, temperature 97.7 F (36.5 C), resp. rate 19, SpO2 97 %. Physical Exam Constitutional:      Appearance: Normal appearance.  HENT:     Head: Normocephalic and atraumatic.  Eyes:     Extraocular Movements: Extraocular movements intact.  Cardiovascular:     Rate and Rhythm: Normal rate.     Pulses: Normal pulses.     Heart sounds: Normal heart sounds.  Pulmonary:  Effort: Pulmonary effort is normal. No respiratory distress.     Breath sounds: Wheezing (minimal expiratory wheezing) present. No rales.     Comments: Deep breath precipitates coughing Abdominal:     General: Bowel sounds are normal.     Palpations: Abdomen is soft.     Tenderness: There is no abdominal tenderness.  Musculoskeletal:        General: Normal range of motion.     Cervical back: Normal range of motion.     Right lower leg: No edema.     Left lower leg: No edema.  Skin:    General: Skin is warm and dry.  Neurological:     Mental Status: He is alert and oriented to person, place, and time.  Mental status is at baseline.  Psychiatric:        Mood and Affect: Mood normal.    Pertinent Labs: CBC    Component Value Date/Time   WBC 4.4 05/06/2021 0953   RBC 3.71 (L) 05/06/2021 0953   HGB 11.9 (L) 05/06/2021 0953   HCT 38.2 (L) 05/06/2021 0953   PLT 153 05/06/2021 0953   MCV 103.0 (H) 05/06/2021 0953   MCH 32.1 05/06/2021 0953   MCHC 31.2 05/06/2021 0953   RDW 13.8 05/06/2021 0953     CMP     Component Value Date/Time   NA 136 05/06/2021 0953   K 5.5 (H) 05/06/2021 0953   CL 107 05/06/2021 0953   CO2 16 (L) 05/06/2021 0953   GLUCOSE 95 05/06/2021 0953   BUN 60 (H) 05/06/2021 0953   CREATININE 7.48 (H) 05/06/2021 0953   CALCIUM 9.4 05/06/2021 0953   PROT 6.0 (L) 07/22/2019 0250   ALBUMIN 3.0 (L) 07/22/2019 0250   AST 15 07/22/2019 0250   ALT 11 07/22/2019 0250   ALKPHOS 67 07/22/2019 0250   BILITOT 0.7 07/22/2019 0250   GFRNONAA 8 (L) 05/06/2021 0953   GFRAA 14 (L) 07/22/2019 0250    Pertinent Imaging: DG Chest 2 View  Result Date: 05/06/2021 CLINICAL DATA:  Shortness of breath EXAM: CHEST - 2 VIEW COMPARISON:  None. FINDINGS: Heart is mildly enlarged. Mediastinum appears normal. No focal consolidation identified in the lungs. Pulmonary vasculature is normal. No pleural effusion or pneumothorax. IMPRESSION: Cardiomegaly with no acute process identified. Electronically Signed   By: Ofilia Neas M.D.   On: 05/06/2021 12:17    EKG: personally reviewed my interpretation is right axis deviation, with hypertrophy.   ASSESSMENT & PLAN:  Assessment: Principal Problem:   AKI (acute kidney injury) ([place])   Phillip Lynch is a 55 y.o. with pertinent PMH of Solitary Kidney, HTN, who presented with URI symptoms and admit for Influenza dn AKI on CKD.  on hospital day 0  Plan: #Influenza:  Patient presents with signs and symptoms of influenza.  Tested positive in the ED.  Endorses subjective fever.  Normal leukocyte count. - Oseltamavir, renally dosed at 30 mg  every other day -Guaifenesin for sputum -Tylenol for fever and muscle aches -Tessalon Perles for cough - Bcx pending - No need for AB.  #Acute on Chronic Kidney Disease: #Solitary Kidney:  #Secondary Hyperparathyroidism #Hyperkalemia: Patient presents with an acute elevation in Cr to 7.48 with a BUN of 60.  Patient does not have an outpatient nephrologist.  Worsening kidney disease likely in the setting of acute influenza with decreased oral intake.  Previously had a positive ANA with a positive RNP at a previous hospitalization. No evidence of hypervolemia or uremia on exam.  Hyperkalemia to 5.5, will give a single dose of lokelma and repeat BMP tonight.  - Urine Na pending - Protein/Cr ration pending  - Nephrology consulted. Appreciate their assistance - NO emergent need for RRT. Will likely need hemodialysis in the future .  - Strict ins and outs - Avoid nephrotoxic medications when possible - Will start Bicarb - Will give a dose of lokelma, repeat BMP this evening  #HTN Will start PRN labetalol in the setting of acute infection - Consider starting schedule antihypertension medications tomorrow.  #Macrocytic Anemia: Patient has had a macrocytosis at previous admissions. B12 and Folate were normal. Smear and MMA was not performed. Likely a component of anemia of chronic kidney disease.  - Repeat ferritin and reticulocyte count  Best Practice: Diet: Regular diet IVF: Fluids: none, Rate: None VTE: heparin injection 5,000 Units Start: 05/06/21 1400 Code: Full AB: Tamiflu Status: Inpatient with expected length of stay greater than 2 midnights. Anticipated Discharge Location: Home Barriers to Discharge: Medical stability  Signature: Lawerance Cruel, D.O.  Internal Medicine Resident, PGY-3 [facility]  Pager: [phone] 12:55 PM, 05/06/2021   Please contact the on call pager after 5 pm and on weekends at [phone].

## 2021-05-06 NOTE — Consult Note (Signed)
Reason for Consult: Acute kidney injury on chronic kidney disease stage V Referring Physician: Velna Ochs MD  HPI:  55 year old African-American man with past medical history significant for hypertension that is poorly treated, solitary kidney and chronic kidney disease stage V (last lab check over a year ago when creatinine was around 5.0).  He unfortunately has not been following up with any provider and presented to the emergency room today after a weekend of flulike symptoms with fever, chills, myalgias, cough that was minimally productive and diarrhea.  He reports decreased oral intake with some nausea but denies any vomiting.  He denies any chest pain or shortness of breath and did not have any hemoptysis.  He does not routinely take any medications and denies the use of any nonsteroidal anti-inflammatory drugs or recent antibiotics/iodinated intravenous contrast exposure.  He denies any skin rash or joint swelling/stiffness.  He reports stable nocturia but denies any dysuria, urgency, frequency or hematuria.  He denies any intravenous drug use or chronic infections including hepatitis or HIV.  Past Medical History:  Diagnosis Date   Hypertension     No past surgical history on file.  Family History  Problem Relation Age of Onset   Stroke Mother     Social History:  reports that he has been smoking. He has never used smokeless tobacco. He reports current alcohol use.  Drug: Marijuana.  Allergies:  Allergies  Allergen Reactions   Nsaids Other (See Comments)    Patient was born with only 1 kidney and was told to not take NSAID(s)    Medications: I have reviewed the patient's current medications. Scheduled:  amLODipine  10 mg Oral Daily   guaiFENesin  600 mg Oral BID   heparin  5,000 Units Subcutaneous Q8H   oseltamivir  30 mg Oral QODAY   sodium bicarbonate  650 mg Oral BID   sodium zirconium cyclosilicate  5 g Oral Once    BMP Latest Ref Rng & Units 05/06/2021  07/22/2019 07/21/2019  Glucose 70 - 99 mg/dL 95 97 129(H)  BUN 6 - 20 mg/dL 60(H) 39(H) 40(H)  Creatinine 0.61 - 1.24 mg/dL 7.48(H) 4.90(H) 5.22(H)  Sodium 135 - 145 mmol/L 136 142 143  Potassium 3.5 - 5.1 mmol/L 5.5(H) 5.2(H) 4.7  Chloride 98 - 111 mmol/L 107 112(H) 114(H)  CO2 22 - 32 mmol/L 16(L) 20(L) 20(L)  Calcium 8.9 - 10.3 mg/dL 9.4 8.5(L) 8.9   CBC Latest Ref Rng & Units 05/06/2021 07/22/2019 07/20/2019  WBC 4.0 - 10.5 K/uL 4.4 4.7 4.6  Hemoglobin 13.0 - 17.0 g/dL 11.9(L) 10.4(L) 11.0(L)  Hematocrit 39.0 - 52.0 % 38.2(L) 33.9(L) 35.7(L)  Platelets 150 - 400 K/uL 153 178 151   Urinalysis    Component Value Date/Time   COLORURINE YELLOW 05/06/2021 1003   APPEARANCEUR CLOUDY (A) 05/06/2021 1003   LABSPEC 1.013 05/06/2021 1003   PHURINE 5.0 05/06/2021 1003   GLUCOSEU NEGATIVE 05/06/2021 1003   HGBUR MODERATE (A) 05/06/2021 1003   BILIRUBINUR NEGATIVE 05/06/2021 1003   KETONESUR 5 (A) 05/06/2021 1003   PROTEINUR 100 (A) 05/06/2021 1003   UROBILINOGEN 0.2 09/24/2009 0230   NITRITE NEGATIVE 05/06/2021 1003   LEUKOCYTESUR LARGE (A) 05/06/2021 1003      DG Chest 2 View  Result Date: 05/06/2021 CLINICAL DATA:  Shortness of breath EXAM: CHEST - 2 VIEW COMPARISON:  None. FINDINGS: Heart is mildly enlarged. Mediastinum appears normal. No focal consolidation identified in the lungs. Pulmonary vasculature is normal. No pleural effusion or pneumothorax. IMPRESSION: Cardiomegaly  with no acute process identified. Electronically Signed   By: Ofilia Neas M.D.   On: 05/06/2021 12:17    Review of Systems  Constitutional:  Positive for fatigue. Negative for chills and fever.  HENT:  Negative for postnasal drip, sore throat and trouble swallowing.   Respiratory:  Negative for cough, chest tightness and shortness of breath.   Cardiovascular:  Positive for leg swelling. Negative for chest pain.  Gastrointestinal:  Positive for diarrhea. Negative for abdominal pain, constipation, nausea and  vomiting.  Endocrine: Negative for cold intolerance and polyphagia.  Genitourinary:  Negative for flank pain, frequency and hematuria.  Musculoskeletal:  Negative for back pain, joint swelling and neck pain.  Skin:  Negative for color change and rash.  Neurological:  Positive for weakness. Negative for tremors and headaches.  Blood pressure (!) 158/111, pulse 87, temperature 97.7 F (36.5 C), resp. rate (!) 22, SpO2 97 %. Physical Exam Vitals and nursing note reviewed.  Constitutional:      General: He is not in acute distress.    Appearance: Normal appearance. He is normal weight. He is not toxic-appearing.  HENT:     Head: Normocephalic and atraumatic.     Right Ear: External ear normal.     Left Ear: External ear normal.     Nose: Nose normal.     Mouth/Throat:     Mouth: Mucous membranes are dry.     Pharynx: Oropharynx is clear.  Eyes:     General: No scleral icterus.    Extraocular Movements: Extraocular movements intact.     Conjunctiva/sclera: Conjunctivae normal.  Cardiovascular:     Rate and Rhythm: Normal rate and regular rhythm.     Heart sounds: No murmur heard.   No gallop.  Pulmonary:     Effort: Pulmonary effort is normal.     Breath sounds: Normal breath sounds. No wheezing or rales.  Abdominal:     General: Abdomen is flat. There is no distension.     Palpations: Abdomen is soft.     Tenderness: There is no abdominal tenderness.  Musculoskeletal:     Cervical back: Normal range of motion and neck supple.     Right lower leg: No edema.     Left lower leg: No edema.  Skin:    General: Skin is warm and dry.  Neurological:     General: No focal deficit present.     Mental Status: He is alert and oriented to person, place, and time.  Psychiatric:        Mood and Affect: Mood normal.    Assessment/Plan: 1.  Acute kidney injury on chronic kidney disease stage V: Whereas it is possible that he has hemodynamically mediated acute kidney injury in the setting  of febrile illness, it is entirely likely that he has had progression of chronic kidney disease with significant underlying renal dysfunction superimposed by poorly controlled hypertension.  I will obtain a renal ultrasound in order to evaluate renal size and structure and quantify urine protein/creatinine ratio along with urine electrolytes as well as recheck an antinuclear antibody that was positive about a year ago.  I agree with intravenous fluids at this time to try and see if volume replacement will help alleviate his electrolyte abnormalities/improve renal function and monitor subsequent labs. Avoid nephrotoxic medications including NSAIDs and iodinated intravenous contrast exposure unless the latter is absolutely indicated.  Preferred narcotic agents for pain control are hydromorphone, fentanyl, and methadone. Morphine should not be used. Avoid Baclofen  and avoid oral sodium phosphate and magnesium citrate based laxatives / bowel preps. Continue strict Input and Output monitoring. Will monitor the patient closely with you and intervene or adjust therapy as indicated by changes in clinical status/labs. 2.  Influenza: Started on antiviral therapy with oseltamivir along with symptomatic management with guaifenesin/Tylenol and Tessalon Perles for positive influenza test in the emergency room. 3.  Hyperkalemia: Mild and secondary to acute kidney injury, monitor with intravenous fluids. 4.  Anion gap metabolic acidosis: Monitor with volume replacement at this time. 5.  Hypertension: Significantly elevated blood pressures, restart antihypertensive therapy with nonselective beta-blocker and low-dose amlodipine.   Annamary Buschman K. 05/06/2021, 2:10 PM

## 2021-05-06 NOTE — ED Provider Notes (Signed)
Emergency Medicine Provider Triage Evaluation Note  Phillip Lynch , a 55 y.o. male  was evaluated in triage.  Pt complains of cold sxs.  Review of Systems  Positive: Chills, body aches, congestion, dizzy, weak, weight loss Negative: N/v/d, dysuria, cp, abd pain  Physical Exam  BP (!) 203/136 (BP Location: Left Arm)   Pulse (!) 107   Temp 97.7 F (36.5 C)   Resp 18   SpO2 97%  Gen:   Awake, no distress   Resp:  Normal effort  MSK:   Moves extremities without difficulty  Other:    Medical Decision Making  Medically screening exam initiated at 9:58 AM.  Appropriate orders placed.  Phillip Lynch was informed that the remainder of the evaluation will be completed by another provider, this initial triage assessment does not replace that evaluation, and the importance of remaining in the ED until their evaluation is complete.  Pt report cold sxs x 3 days, weak, dizzy, congestion, and myalgia.  Hx of kidney disease, denies hx of HTN.  Has been vaccinated for COVID.  Have been working outside.    [name], PA-C 05/06/21 1000    Daleen Bo, MD 05/06/21 1729
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
## 2021-05-06 NOTE — ED Notes (Signed)
Transport arranged for pt.

## 2021-05-06 NOTE — ED Triage Notes (Signed)
Pt. Stated, Ive had diarrhea with light headedness, , fever , leg pain. This started about a week ago.

## 2021-05-07 ENCOUNTER — Observation Stay (HOSPITAL_COMMUNITY): Payer: Medicaid Other

## 2021-05-07 ENCOUNTER — Encounter (HOSPITAL_COMMUNITY): Payer: Self-pay | Admitting: Internal Medicine

## 2021-05-07 DIAGNOSIS — N186 End stage renal disease: Secondary | ICD-10-CM

## 2021-05-07 DIAGNOSIS — D631 Anemia in chronic kidney disease: Secondary | ICD-10-CM | POA: Diagnosis present

## 2021-05-07 DIAGNOSIS — E43 Unspecified severe protein-calorie malnutrition: Secondary | ICD-10-CM | POA: Diagnosis present

## 2021-05-07 DIAGNOSIS — Z20822 Contact with and (suspected) exposure to covid-19: Secondary | ICD-10-CM | POA: Diagnosis present

## 2021-05-07 DIAGNOSIS — Q600 Renal agenesis, unilateral: Secondary | ICD-10-CM | POA: Diagnosis not present

## 2021-05-07 DIAGNOSIS — I132 Hypertensive heart and chronic kidney disease with heart failure and with stage 5 chronic kidney disease, or end stage renal disease: Secondary | ICD-10-CM | POA: Diagnosis present

## 2021-05-07 DIAGNOSIS — R634 Abnormal weight loss: Secondary | ICD-10-CM

## 2021-05-07 DIAGNOSIS — I5042 Chronic combined systolic (congestive) and diastolic (congestive) heart failure: Secondary | ICD-10-CM

## 2021-05-07 DIAGNOSIS — J101 Influenza due to other identified influenza virus with other respiratory manifestations: Secondary | ICD-10-CM | POA: Diagnosis present

## 2021-05-07 DIAGNOSIS — I120 Hypertensive chronic kidney disease with stage 5 chronic kidney disease or end stage renal disease: Secondary | ICD-10-CM

## 2021-05-07 DIAGNOSIS — Z823 Family history of stroke: Secondary | ICD-10-CM | POA: Diagnosis not present

## 2021-05-07 DIAGNOSIS — Z79899 Other long term (current) drug therapy: Secondary | ICD-10-CM

## 2021-05-07 DIAGNOSIS — Z5900 Homelessness unspecified: Secondary | ICD-10-CM | POA: Diagnosis not present

## 2021-05-07 DIAGNOSIS — E872 Acidosis, unspecified: Secondary | ICD-10-CM | POA: Diagnosis present

## 2021-05-07 DIAGNOSIS — N2581 Secondary hyperparathyroidism of renal origin: Secondary | ICD-10-CM | POA: Diagnosis present

## 2021-05-07 DIAGNOSIS — N179 Acute kidney failure, unspecified: Secondary | ICD-10-CM | POA: Diagnosis present

## 2021-05-07 DIAGNOSIS — D539 Nutritional anemia, unspecified: Secondary | ICD-10-CM | POA: Diagnosis present

## 2021-05-07 DIAGNOSIS — R3129 Other microscopic hematuria: Secondary | ICD-10-CM | POA: Diagnosis present

## 2021-05-07 DIAGNOSIS — F17290 Nicotine dependence, other tobacco product, uncomplicated: Secondary | ICD-10-CM | POA: Diagnosis present

## 2021-05-07 DIAGNOSIS — I43 Cardiomyopathy in diseases classified elsewhere: Secondary | ICD-10-CM | POA: Diagnosis present

## 2021-05-07 DIAGNOSIS — E875 Hyperkalemia: Secondary | ICD-10-CM | POA: Diagnosis present

## 2021-05-07 DIAGNOSIS — Z888 Allergy status to other drugs, medicaments and biological substances status: Secondary | ICD-10-CM | POA: Diagnosis not present

## 2021-05-07 DIAGNOSIS — J09X2 Influenza due to identified novel influenza A virus with other respiratory manifestations: Secondary | ICD-10-CM | POA: Diagnosis present

## 2021-05-07 DIAGNOSIS — F1210 Cannabis abuse, uncomplicated: Secondary | ICD-10-CM | POA: Diagnosis present

## 2021-05-07 DIAGNOSIS — F1990 Other psychoactive substance use, unspecified, uncomplicated: Secondary | ICD-10-CM

## 2021-05-07 DIAGNOSIS — N185 Chronic kidney disease, stage 5: Secondary | ICD-10-CM | POA: Diagnosis present

## 2021-05-07 DIAGNOSIS — Z681 Body mass index (BMI) 19 or less, adult: Secondary | ICD-10-CM | POA: Diagnosis not present

## 2021-05-07 DIAGNOSIS — F1910 Other psychoactive substance abuse, uncomplicated: Secondary | ICD-10-CM | POA: Diagnosis present

## 2021-05-07 DIAGNOSIS — F17210 Nicotine dependence, cigarettes, uncomplicated: Secondary | ICD-10-CM

## 2021-05-07 DIAGNOSIS — Z992 Dependence on renal dialysis: Secondary | ICD-10-CM

## 2021-05-07 DIAGNOSIS — J111 Influenza due to unidentified influenza virus with other respiratory manifestations: Secondary | ICD-10-CM

## 2021-05-07 DIAGNOSIS — D61818 Other pancytopenia: Secondary | ICD-10-CM | POA: Diagnosis not present

## 2021-05-07 DIAGNOSIS — R9431 Abnormal electrocardiogram [ECG] [EKG]: Secondary | ICD-10-CM

## 2021-05-07 DIAGNOSIS — I5021 Acute systolic (congestive) heart failure: Secondary | ICD-10-CM | POA: Diagnosis present

## 2021-05-07 DIAGNOSIS — I472 Ventricular tachycardia, unspecified: Secondary | ICD-10-CM | POA: Diagnosis not present

## 2021-05-07 DIAGNOSIS — I5022 Chronic systolic (congestive) heart failure: Secondary | ICD-10-CM

## 2021-05-07 DIAGNOSIS — I5020 Unspecified systolic (congestive) heart failure: Secondary | ICD-10-CM

## 2021-05-07 LAB — RESPIRATORY PANEL BY PCR

## 2021-05-07 LAB — CBC
HCT: 32.3 % — ABNORMAL LOW (ref 39.0–52.0)
Hemoglobin: 10.6 g/dL — ABNORMAL LOW (ref 13.0–17.0)
MCH: 32.3 pg (ref 26.0–34.0)
MCHC: 32.8 g/dL (ref 30.0–36.0)
MCV: 98.5 fL (ref 80.0–100.0)
Platelets: 123 10*3/uL — ABNORMAL LOW (ref 150–400)
RBC: 3.28 MIL/uL — ABNORMAL LOW (ref 4.22–5.81)
RDW: 13.4 % (ref 11.5–15.5)
WBC: 3.8 10*3/uL — ABNORMAL LOW (ref 4.0–10.5)
nRBC: 0 % (ref 0.0–0.2)

## 2021-05-07 LAB — COMPREHENSIVE METABOLIC PANEL
ALT: 20 U/L (ref 0–44)
AST: 22 U/L (ref 15–41)
Albumin: 3.2 g/dL — ABNORMAL LOW (ref 3.5–5.0)
Alkaline Phosphatase: 69 U/L (ref 38–126)
Anion gap: 11 (ref 5–15)
BUN: 64 mg/dL — ABNORMAL HIGH (ref 6–20)
CO2: 21 mmol/L — ABNORMAL LOW (ref 22–32)
Calcium: 7.7 mg/dL — ABNORMAL LOW (ref 8.9–10.3)
Chloride: 104 mmol/L (ref 98–111)
Creatinine, Ser: 6.8 mg/dL — ABNORMAL HIGH (ref 0.61–1.24)
GFR, Estimated: 9 mL/min — ABNORMAL LOW (ref >60)
Glucose, Bld: 100 mg/dL — ABNORMAL HIGH (ref 70–99)
Potassium: 4 mmol/L (ref 3.5–5.1)
Sodium: 136 mmol/L (ref 135–145)
Total Bilirubin: 0.6 mg/dL (ref 0.3–1.2)
Total Protein: 6.1 g/dL — ABNORMAL LOW (ref 6.5–8.1)

## 2021-05-07 LAB — LIPID PANEL
Cholesterol: 151 mg/dL (ref 0–200)
HDL: 66 mg/dL (ref >40)
LDL Cholesterol: 58 mg/dL (ref 0–99)
Total CHOL/HDL Ratio: 2.3 RATIO
Triglycerides: 135 mg/dL (ref <150)
VLDL: 27 mg/dL (ref 0–40)

## 2021-05-07 LAB — GLUCOSE, CAPILLARY: Glucose-Capillary: 192 mg/dL — ABNORMAL HIGH (ref 70–99)

## 2021-05-07 LAB — HEMOGLOBIN A1C
Hgb A1c MFr Bld: 4.6 % — ABNORMAL LOW (ref 4.8–5.6)
Mean Plasma Glucose: 85.32 mg/dL

## 2021-05-07 LAB — FOLATE: Folate: 19.2 ng/mL (ref >5.9)

## 2021-05-07 LAB — ECHOCARDIOGRAM COMPLETE
Area-P 1/2: 4.68 cm2
Calc EF: 31.1 %
S' Lateral: 4.9 cm
Single Plane A2C EF: 29.9 %
Single Plane A4C EF: 32.3 %

## 2021-05-07 LAB — VITAMIN B12: Vitamin B-12: 833 pg/mL (ref 180–914)

## 2021-05-07 LAB — RPR: RPR Ser Ql: NONREACTIVE

## 2021-05-07 LAB — TSH: TSH: 1.142 u[IU]/mL (ref 0.350–4.500)

## 2021-05-07 LAB — LACTIC ACID, PLASMA: Lactic Acid, Venous: 1.4 mmol/L (ref 0.5–1.9)

## 2021-05-07 LAB — BRAIN NATRIURETIC PEPTIDE: B Natriuretic Peptide: 950.2 pg/mL — ABNORMAL HIGH (ref 0.0–100.0)

## 2021-05-07 MED ORDER — METOPROLOL TARTRATE 12.5 MG HALF TABLET
12.5000 mg | ORAL_TABLET | Freq: Two times a day (BID) | ORAL | Status: DC
  Administered 2021-05-07 – 2021-05-08 (×3): 12.5 mg via ORAL
  Filled 2021-05-07 (×3): qty 1

## 2021-05-07 MED ORDER — HYDRALAZINE HCL 25 MG PO TABS
25.0000 mg | ORAL_TABLET | Freq: Three times a day (TID) | ORAL | Status: DC
  Administered 2021-05-07 – 2021-05-08 (×3): 25 mg via ORAL
  Filled 2021-05-07 (×3): qty 1

## 2021-05-07 MED ORDER — CEFAZOLIN SODIUM-DEXTROSE 2-4 GM/100ML-% IV SOLN
2.0000 g | INTRAVENOUS | Status: AC
  Filled 2021-05-07: qty 100

## 2021-05-07 NOTE — Consult Note (Signed)
CARDIOLOGY CONSULT NOTE  Patient ID: Phillip Lynch MRN: [id number] DOB/AGE: 17-Aug-1965 55 y.o.  Admit date: 05/06/2021 Attending physician: Velna Ochs, MD Primary Physician:  Patient, [name] (Inactive) Outpatient Cardiologist: None Inpatient Cardiologist: Rex Kras, DO, [facility]  Reason of consultation: Cardiomyopathy Referring physician: Velna Ochs, MD  Chief complaint: Fevers chills body ache  HPI:  Phillip Lynch is a 55 y.o. 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  Patient presents to the hospital with complaints of body ache, lightheadedness, weakness, fevers, chills which started Saturday, 05/04/2021.  Patient was diagnosed with influenza A since hospitalization has been started on Tamiflu and his symptoms have improved.  Patient also has history of solitary kidney and subsequently has developed hypertension and has been uncontrolled for numerous years.  Patient states that he does not check his blood pressures at home whenever it is checked he recalls the numbers being " too high."   Over the last 4 months patient has noted approximately 30 pounds of weight loss without a unifying diagnosis.  Primary team obtain an echo during this hospitalization and he is noted to have severely reduced LVEF with grade 1 diastolic impairment and findings to suggest possible amyloidosis.   Patient has his lawn care service and is functionally active without any effort related symptoms of chest pain or shortness of breath.  He also denies orthopnea, paroxysmal nocturnal dyspnea and lower extremity swelling.  No family history of cardiomyopathy or sudden cardiac death.  No known thyroid disease.  Smokes a cigar regularly, alcohol consumption  on the weekends, marijuana use every other day, and last use of cocaine 2 weeks ago.   Other social barriers to care include no active medical insurance and is currently needing help with placement after discharge as well.  ALLERGIES: Allergies  Allergen Reactions   Nsaids Other (See Comments)    Patient was born with only 1 kidney and was told to not take NSAID(s)    PAST MEDICAL HISTORY: Past Medical History:  Diagnosis Date   Congenital renal agenesis, unilateral    Hypertension    Unintentional weight loss     PAST SURGICAL HISTORY: No prior surgeries.  FAMILY HISTORY: The patient's family history includes Dementia in his mother; Kidney disease in his mother; Stroke in his mother. No family history of premature CAD.  Mom is currently alive has dementia and history of PFO.  Father is deceased.  1 brother and sister both are alive without cardiac comorbidities   SOCIAL HISTORY:  The patient  reports that he has been smoking. He has never used smokeless tobacco. He reports current alcohol use.  Drug: Marijuana.  MEDICATIONS: Current Outpatient Medications  Medication Instructions   amLODipine (NORVASC) 10 mg, Oral, Daily   ferrous sulfate 325 mg, Oral, Daily with breakfast   hydrALAZINE (APRESOLINE) 25 mg, Oral, 3 times daily   metoprolol tartrate (LOPRESSOR) 12.5 mg, Oral, 2 times daily    REVIEW OF SYSTEMS: Review of Systems  Constitutional: Positive for chills (Present on admission, now resolved), fever (Present on admission, now resolved), malaise/fatigue (Present on admission, now resolved) and weight loss.  HENT:  Negative for hoarse voice and nosebleeds.   Eyes:  Negative for discharge, double vision and pain.  Cardiovascular:  Negative for chest pain, claudication, dyspnea on exertion, leg swelling, near-syncope,  orthopnea, palpitations, paroxysmal nocturnal dyspnea and syncope.  Respiratory:  Negative for hemoptysis and shortness of breath.   Musculoskeletal:   Negative for muscle cramps and myalgias.  Gastrointestinal:  Negative for abdominal pain, constipation, diarrhea, hematemesis, hematochezia, melena, nausea and vomiting.  Neurological:  Negative for dizziness and light-headedness.   PHYSICAL EXAM: Vitals with BMI 05/07/2021 05/07/2021 05/07/2021  Height - - -  Weight 120 lbs 6 oz - -  BMI - - -  Systolic - 253 664  Diastolic - 403 474  Pulse - 98 -     Intake/Output Summary (Last 24 hours) at 05/07/2021 1606 Last data filed at 05/07/2021 0700 Gross per 24 hour  Intake 1230.73 ml  Output 1450 ml  Net -219.27 ml    Net IO Since Admission: 880.73 mL [05/07/21 1606]  CONSTITUTIONAL: Age-appropriate, hemodynamically stable, well-developed and well-nourished. No acute distress.  SKIN: Skin is warm and dry. No rash noted. No cyanosis. No pallor. No jaundice HEAD: Normocephalic and atraumatic.  EYES: No scleral icterus MOUTH/THROAT: Moist oral membranes.  NECK: No JVD present. No thyromegaly noted. No carotid bruits  LYMPHATIC: No visible cervical adenopathy.  CHEST Normal respiratory effort. No intercostal retractions  LUNGS: Clear to auscultation bilaterally.  No stridor. No wheezes. No rales.  CARDIOVASCULAR: Regular rate and rhythm, positive S1-S2, no murmurs rubs or gallops appreciated ABDOMINAL: Soft, nontender, nondistended, positive bowel sounds in all 4 quadrants, no apparent ascites.  EXTREMITIES: No pitting edema, warm to touch.  HEMATOLOGIC: No significant bruising NEUROLOGIC: Oriented to person, place, and time. Nonfocal. Normal muscle tone.  PSYCHIATRIC: Normal mood and affect. Normal behavior. Cooperative  RADIOLOGY: DG Chest 2 View  Result Date: 05/06/2021 CLINICAL DATA:  Shortness of breath EXAM: CHEST - 2 VIEW COMPARISON:  None. FINDINGS: Heart is mildly enlarged. Mediastinum appears normal. No focal consolidation identified in the lungs. Pulmonary vasculature is normal. No pleural effusion or pneumothorax.  IMPRESSION: Cardiomegaly with no acute process identified. Electronically Signed   By: Ofilia Neas M.D.   On: 05/06/2021 12:17   US RENAL  Result Date: 05/06/2021 CLINICAL DATA:  Echogenic left kidney.  Right kidney absent. EXAM: RENAL / URINARY TRACT ULTRASOUND COMPLETE COMPARISON:  None. FINDINGS: LEFT kidney: Renal measurements: 8.9 x 5.1 x 3.8 cm = volume: 89 mL. Echogenicity increased. No mass or hydronephrosis visualized. RIGHT kidney: Not visualized. Urinary bladder bladder: Appears normal for degree of bladder distention. Other: None. IMPRESSION: 1. Atrophic left kidney with increased echogenicity suggestive of renal parenchymal disease. 2. Absent right kidney. Electronically Signed   By: Iven Finn M.D.   On: 05/06/2021 15:29   ECHOCARDIOGRAM COMPLETE  Result Date: 05/07/2021    ECHOCARDIOGRAM REPORT   Patient Name:   Phillip Lynch Date of Exam: 05/07/2021 Medical Rec #:  [id number]     Height:       69.0 in Accession #:    [id number]    Weight:       150.8 lb Date of Birth:  [birth year]     BSA:          1.832 m Patient Age:    69 years      BP:           140/96 mmHg Patient Gender: M             HR:           97 bpm. Exam Location:  Inpatient Procedure: 2D Echo, 3D Echo, Cardiac Doppler, Color Doppler and Strain Analysis Indications:  R94.31 Abnormal EKG  History:        Patient has no prior history of Echocardiogram examinations.                 Risk Factors:Hypertension.  Sonographer:    Bernadene Person RDCS Referring Phys: [id number] [place]  1. Left ventricular ejection fraction, by estimation, is 20 to 25%. The left ventricle has severely decreased function. The left ventricle demonstrates global hypokinesis. The left ventricular internal cavity size was mildly dilated. There is moderate left ventricular hypertrophy. Left ventricular diastolic parameters are consistent with Grade I diastolic dysfunction (impaired relaxation). The average left ventricular global  longitudinal strain is -9.0 %. The global longitudinal strain is abnormal. Strain is relatively preserved at the apical cap. Bright myocardium with LVH and relatively preserved apical strain suggests the possibility of cardiac amyloidosis.  2. Right ventricular systolic function is normal. The right ventricular size is normal. There is normal pulmonary artery systolic pressure. The estimated right ventricular systolic pressure is 23.3 mmHg.  3. The mitral valve is normal in structure. Trivial mitral valve regurgitation. No evidence of mitral stenosis.  4. The aortic valve is tricuspid. Aortic valve regurgitation is mild. No aortic stenosis is present.  5. Aortic dilatation noted. There is mild dilatation of the ascending aorta, measuring 37 mm.  6. The inferior vena cava is normal in size with greater than 50% respiratory variability, suggesting right atrial pressure of 3 mmHg. FINDINGS  Left Ventricle: Left ventricular ejection fraction, by estimation, is 20 to 25%. The left ventricle has severely decreased function. The left ventricle demonstrates global hypokinesis. The average left ventricular global longitudinal strain is -9.0 %. The global longitudinal strain is abnormal. The left ventricular internal cavity size was mildly dilated. There is moderate left ventricular hypertrophy. Left ventricular diastolic parameters are consistent with Grade I diastolic dysfunction (impaired relaxation). Right Ventricle: The right ventricular size is normal. No increase in right ventricular wall thickness. Right ventricular systolic function is normal. There is normal pulmonary artery systolic pressure. The tricuspid regurgitant velocity is 1.79 m/s, and  with an assumed right atrial pressure of 3 mmHg, the estimated right ventricular systolic pressure is 00.7 mmHg. Left Atrium: Left atrial size was normal in size. Right Atrium: Right atrial size was normal in size. Pericardium: There is no evidence of pericardial effusion.  Mitral Valve: The mitral valve is normal in structure. Trivial mitral valve regurgitation. No evidence of mitral valve stenosis. Tricuspid Valve: The tricuspid valve is normal in structure. Tricuspid valve regurgitation is trivial. Aortic Valve: The aortic valve is tricuspid. Aortic valve regurgitation is mild. No aortic stenosis is present. Pulmonic Valve: The pulmonic valve was normal in structure. Pulmonic valve regurgitation is not visualized. Aorta: The aortic root is normal in size and structure and aortic dilatation noted. There is mild dilatation of the ascending aorta, measuring 37 mm. Venous: The inferior vena cava is normal in size with greater than 50% respiratory variability, suggesting right atrial pressure of 3 mmHg. IAS/Shunts: No atrial level shunt detected by color flow Doppler.  LEFT VENTRICLE PLAX 2D LVIDd:         5.60 cm      Diastology LVIDs:         4.90 cm      LV e' medial:    3.31 cm/s LV PW:         1.40 cm      LV E/e' medial:  15.0 LV IVS:  0.90 cm      LV e' lateral:   3.37 cm/s LVOT diam:     2.10 cm      LV E/e' lateral: 14.7 LV SV:         43 LV SV Index:   24           2D Longitudinal Strain LVOT Area:     3.46 cm     2D Strain GLS Avg:     -9.0 %  LV Volumes (MOD) LV vol d, MOD A2C: 130.0 ml 3D Volume EF: LV vol d, MOD A4C: 161.0 ml 3D EF:        37 % LV vol s, MOD A2C: 91.1 ml  LV EDV:       246 ml LV vol s, MOD A4C: 109.0 ml LV ESV:       155 ml LV SV MOD A2C:     38.9 ml  LV SV:        91 ml LV SV MOD A4C:     161.0 ml LV SV MOD BP:      45.2 ml RIGHT VENTRICLE RV S prime:     11.90 cm/s TAPSE (M-mode): 2.4 cm LEFT ATRIUM           Index        RIGHT ATRIUM           Index LA diam:      2.40 cm 1.31 cm/m   RA Area:     13.90 cm LA Vol (A2C): 43.5 ml 23.74 ml/m  RA Volume:   34.10 ml  18.61 ml/m LA Vol (A4C): 40.3 ml 21.99 ml/m  AORTIC VALVE LVOT Vmax:   91.30 cm/s LVOT Vmean:  56.400 cm/s LVOT VTI:    0.125 m  AORTA Ao Root diam: 3.20 cm Ao Asc diam:  3.70 cm MITRAL  VALVE               TRICUSPID VALVE MV Area (PHT): 4.68 cm    TR Peak grad:   12.8 mmHg MV Decel Time: 162 msec    TR Vmax:        179.00 cm/s MV E velocity: 49.50 cm/s MV A velocity: 81.50 cm/s  SHUNTS MV E/A ratio:  0.61        Systemic VTI:  0.12 m                            Systemic Diam: 2.10 cm Dalton McleanMD Electronically signed by Franki Monte Signature Date/Time: 05/07/2021/12:37:12 PM    Final     LABORATORY DATA: Lab Results  Component Value Date   WBC 3.8 (L) 05/07/2021   HGB 10.6 (L) 05/07/2021   HCT 32.3 (L) 05/07/2021   MCV 98.5 05/07/2021   PLT 123 (L) 05/07/2021    Recent Labs  Lab 05/07/21 0146  NA 136  K 4.0  CL 104  CO2 21*  BUN 64*  CREATININE 6.80*  CALCIUM 7.7*  PROT 6.1*  BILITOT 0.6  ALKPHOS 69  ALT 20  AST 22  GLUCOSE 100*    Lipid Panel  No results found for: CHOL, HDL, LDLCALC, LDLDIRECT, TRIG, CHOLHDL  BNP (last 3 results) No results for input(s): BNP in the last 8760 hours.  HEMOGLOBIN A1C Lab Results  Component Value Date   HGBA1C 4.9 07/21/2019   MPG 93.93 07/21/2019    Cardiac Panel (last 3 results) No results for input(s): CKTOTAL,  CKMB, TROPONINIHS, RELINDX in the last 72 hours.   TSH Recent Labs    05/07/21 1126  TSH 1.142     CARDIAC DATABASE: EKG: 05/06/2021: Sinus tachycardia, 105 bpm, right axis deviation, biatrial enlargement, right ventricular hypertrophy without underlying injury pattern.  Echocardiogram: 05/07/2021:  1. Left ventricular ejection fraction, by estimation, is 20 to 25%. The  left ventricle has severely decreased function. The left ventricle  demonstrates global hypokinesis. The left ventricular internal cavity size  was mildly dilated. There is moderate  left ventricular hypertrophy. Left ventricular diastolic parameters are  consistent with Grade I diastolic dysfunction (impaired relaxation). The  average left ventricular global longitudinal strain is -9.0 %. The global  longitudinal  strain is abnormal.  Strain is relatively preserved at the apical cap. Bright myocardium with  LVH and relatively preserved apical strain suggests the possibility of  cardiac amyloidosis.   2. Right ventricular systolic function is normal. The right ventricular  size is normal. There is normal pulmonary artery systolic pressure. The  estimated right ventricular systolic pressure is 29.5 mmHg.   3. The mitral valve is normal in structure. Trivial mitral valve  regurgitation. No evidence of mitral stenosis.   4. The aortic valve is tricuspid. Aortic valve regurgitation is mild. No  aortic stenosis is present.   5. Aortic dilatation noted. 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   6. The inferior vena cava is normal in size with greater than 50%  respiratory variability, suggesting right atrial pressure of 3 mmHg.    IMPRESSION & RECOMMENDATIONS: Phillip Lynch is a 55 y.o. African-American male whose past medical history and cardiovascular risk factors include: History of a solitary kidney, uncontrolled hypertension with chronic kidney disease stage V, occasional cocaine use, marijuana use.  Impression:  Newly discovered systolic and diastolic heart failure, stage C, NYHA class II: Differential diagnosis includes burned-out cardiomyopathy due to uncontrolled hypertension, infiltrative cardiomyopathy, viral cardiomyopathy, ischemia cannot be ruled out. Clinically appears to be compensated. Echocardiogram personally reviewed.  Right ventricular systolic function and size are preserved.  Right atrial pressure estimated at 3 mmHg. TSH within normal limits. Will check BNP. AST ALT within normal limits,  Checked lactic acid - w/n normal limits.  Currently being treated for influenza A infection.  We will also check respiratory panel. Primary team checking SPEP, UPEP, immunofixation, and free light chains.  Agree with work-up. Check urine drug screen No family history  of premature coronary artery disease or sudden cardiac death.  Patient denies any history of syncope. Place on telemetry  Discontinue amlodipine in the setting of reduced LVEF. Start hydralazine 25 mg p.o. 3 times daily. Depending on how he does with hydralazine goal would be to transition him to BiDil. Continue Lopressor 12.5 mg p.o. twice daily with goals of transitioning to Toprol-XL prior to discharge. Currently not on ACE inhibitor/ARB/Arni secondary to stage V renal disease. Currently not a candidate for Farxiga due to EGFR less than 30 mg/min per 1.  73 m. Spoke to Dr. Posey Pronto from nephrology with regards to adding diuretics.  Shared decision was to initiate Lasix.  Will defer volume management to nephrology in the setting of stage V kidney disease. Patient is not having symptoms of angina, EKG not consistent with ACS, and given his estimated GFR at 9 mL/min per 1.73 m we will hold off on invasive angiography for now unless a change in clinical status. Not a candidate for cardiac MRI due to renal function.  For now focus on up titration of guideline directed medical therapy as his hemodynamics and laboratory values allow.  When able given his renal function would recommend left and right heart catheterization.  Cardiac amyloidosis work-up in process.  Will consider PYP study prior to discharge.  Will need close follow-up post discharge to uptitrate his medications and to reevaluate LVEF in 90 days after being on maximally tolerated guideline directed medical therapy to see if he is a candidate for primary prevention.  Patient verbalizes understanding of the recommendations.   Hypertension with heart failure with stage V CKD: Educated on the importance of better blood pressure management. Medication titration as discussed above. Volume status management per nephrology Educated on importance of low salt diet. Currently being evaluated by vascular surgery for dialysis graft  placement  Polysubstance abuse: Check urine drug screen Marijuana every other day Last use of cocaine 2 weeks ago Cigar regularly Patient is educated extensively that he cannot consume cocaine and beta-blockers concomitantly as there are drug to drug interactions which can lead to worsening morbidity and mortality.  Patient verbalizes understanding.  Influenza A infection: Currently on Tamiflu.  Primary team and social work helping to address his barrier to healthcare and discharge planning.   We will do further management of his other chronic comorbid conditions to primary team.   Thank you for allowing [place] to participate in the care of Phillip Lynch please reach out if any questions or concerns arise.  Total encounter time 85 minutes. *Total Encounter Time as defined by the Centers for Medicare and Medicaid Services includes, in addition to the face-to-face time of a patient visit (documented in the note above) non-face-to-face time: obtaining and reviewing outside history, ordering and reviewing medications, tests or procedures, care coordination (communications with other health care professionals or caregivers) and documentation in the medical record.  Patient's questions and concerns were addressed to his satisfaction. He voices understanding of the instructions provided during this encounter.   This note was created using a voice recognition software as a result there may be grammatical errors inadvertently enclosed that do not reflect the nature of this encounter. Every attempt is made to correct such errors.  Mechele Claude [facility]  Pager: [phone] Office: [phone] 05/07/2021, 4:06 PM

## 2021-05-07 NOTE — Progress Notes (Signed)
Notes for next shift 05/07/21 1826: No blood draws, IV, or BP in LEFT ARM BNP elevated to 950 EF 20-25% BUN 64  Creat 6.8 Vein Mapping done  Resp Virus collection sent 11/22  24hr URINE IN PROGRESS end time is 11/23 at 1506  Keep on ice and lab order in drawer. Collection container in bathroom on ice. Patient aware and educated on need to save urine.  AV shunt tomorrow 11/23 no time as yet  NPO after midnight tonight 11/22  IV SL  DROPLET PRECAUTIONS FOR FLU

## 2021-05-07 NOTE — Progress Notes (Signed)
  Echocardiogram 2D Echocardiogram has been performed.  Fidel Levy 05/07/2021, 9:03 AM

## 2021-05-07 NOTE — Progress Notes (Signed)
Permit signed for AV fistula placement 11/23. Patient had no questions regarding procedure.  Following am shower he became very weak, assisted to bed and at this time has improved. BP at that time was 126/90 HR 78 oxygen sat 98% RA. He remained alert and oriented.   24 hour urine started at 3:06pm. Patient instructed to use urinal for each void and notify staff to place urine into collection container.  He verbalized understanding.   Requested he call staff when getting out of bed and bed alarm is set.

## 2021-05-07 NOTE — Progress Notes (Signed)
Patient ID: Phillip Lynch, male   DOB: [birth year], 55 y.o.   MRN: [id number] [facility] Progress Note   Assessment/ Plan:   1.  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  Discussed with the patient his predicament and will consult vascular surgery for dialysis access placement (fistula or graft but not catheter)-I advised him that he does not need dialysis at this time but will likely need it in the near future and it would be prudent to begin planning at this time.  Vein mapping ordered. 2.  Influenza: Started on antiviral therapy with oseltamivir for influenza along with symptomatic management with guaifenesin/Tylenol and Tessalon Perles.  3.  Hyperkalemia: Corrected with medical management/ongoing intravenous fluids. 4.  Anion gap metabolic acidosis: Monitor with volume replacement at this time. 5.  Hypertension: Elevated blood pressures noted overnight, started on amlodipine 10 mg daily and metoprolol 12.5 mg twice daily.  Subjective:   Reports to be feeling a little better this morning without dysgeusia, nausea/vomiting or hallucinations.   Objective:   BP (!) 161/105 (BP Location: Right Arm)   Pulse 98   Temp 98.1 F (36.7 C) (Oral)   Resp 16   SpO2 99%   Intake/Output Summary (Last 24 hours) at 05/07/2021 0946 Last data filed at 05/07/2021 0700 Gross per 24 hour  Intake 2330.73 ml  Output 1450 ml  Net 880.73 ml   Weight change:   Physical Exam: Gen: Comfortably resting in bed, watching television CVS: Pulse regular rhythm, normal rate, S1 and S2 normal Resp: Clear to auscultation bilaterally, no rales/rhonchi Abd: Soft, flat, nontender, bowel sounds normal Ext: No lower extremity edema, left antecubital IV line in place.  No asterixis  Imaging: DG Chest 2 View  Result Date:  05/06/2021 CLINICAL DATA:  Shortness of breath EXAM: CHEST - 2 VIEW COMPARISON:  None. FINDINGS: Heart is mildly enlarged. Mediastinum appears normal. No focal consolidation identified in the lungs. Pulmonary vasculature is normal. No pleural effusion or pneumothorax. IMPRESSION: Cardiomegaly with no acute process identified. Electronically Signed   By: Ofilia Neas M.D.   On: 05/06/2021 12:17   US RENAL  Result Date: 05/06/2021 CLINICAL DATA:  Echogenic left kidney.  Right kidney absent. EXAM: RENAL / URINARY TRACT ULTRASOUND COMPLETE COMPARISON:  None. FINDINGS: LEFT kidney: Renal measurements: 8.9 x 5.1 x 3.8 cm = volume: 89 mL. Echogenicity increased. No mass or hydronephrosis visualized. RIGHT kidney: Not visualized. Urinary bladder bladder: Appears normal for degree of bladder distention. Other: None. IMPRESSION: 1. Atrophic left kidney with increased echogenicity suggestive of renal parenchymal disease. 2. Absent right kidney. Electronically Signed   By: Iven Finn M.D.   On: 05/06/2021 15:29    Labs: BMET Recent Labs  Lab 05/06/21 0953 05/06/21 1655 05/06/21 1700 05/07/21 0146  NA 136  --  136 136  K 5.5*  --  4.7 4.0  CL 107  --  110 104  CO2 16*  --  16* 21*  GLUCOSE 95  --  95 100*  BUN 60*  --  60* 64*  CREATININE 7.48* 6.76* 6.71* 6.80*  CALCIUM 9.4  --  7.8* 7.7*  PHOS  --  4.7*  --   --    CBC Recent Labs  Lab 05/06/21 0953 05/06/21 1655 05/07/21 0146  WBC 4.4 4.6 3.8*  HGB 11.9* 10.5* 10.6*  HCT  38.2* 33.2* 32.3*  MCV 103.0* 101.5* 98.5  PLT 153 132* 123*    Medications:     amLODipine  10 mg Oral Daily   guaiFENesin  600 mg Oral BID   heparin  5,000 Units Subcutaneous Q8H   metoprolol tartrate  12.5 mg Oral BID   oseltamivir  30 mg Oral QODAY   sodium bicarbonate  650 mg Oral BID   Elmarie Shiley, MD 05/07/2021, 9:46 AM
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
## 2021-05-07 NOTE — Plan of Care (Signed)

## 2021-05-07 NOTE — Progress Notes (Signed)
Upper extremity vein mapping completed. Refer to "CV Proc" under chart review to view preliminary results.  05/07/2021 4:55 PM Kelby Aline., MHA, RVT, RDCS, RDMS
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
## 2021-05-07 NOTE — Progress Notes (Signed)
BP 167/107 HR 98 MD was present at time of reading. Reported and MD stated to give the new dose of Lopressor and recek before give the IV medicaiton

## 2021-05-08 ENCOUNTER — Encounter (HOSPITAL_COMMUNITY)
Admission: EM | Disposition: A | Discharge status: Home / Self Care | Payer: Self-pay | Source: Home / Self Care | Attending: Internal Medicine

## 2021-05-08 ENCOUNTER — Encounter (HOSPITAL_COMMUNITY): Payer: Self-pay | Admitting: Anesthesiology

## 2021-05-08 ENCOUNTER — Other Ambulatory Visit: Payer: Self-pay

## 2021-05-08 ENCOUNTER — Encounter (HOSPITAL_COMMUNITY): Payer: Self-pay | Admitting: Internal Medicine

## 2021-05-08 DIAGNOSIS — N185 Chronic kidney disease, stage 5: Secondary | ICD-10-CM

## 2021-05-08 DIAGNOSIS — I10 Essential (primary) hypertension: Secondary | ICD-10-CM

## 2021-05-08 DIAGNOSIS — J09X2 Influenza due to identified novel influenza A virus with other respiratory manifestations: Secondary | ICD-10-CM

## 2021-05-08 DIAGNOSIS — E875 Hyperkalemia: Secondary | ICD-10-CM

## 2021-05-08 DIAGNOSIS — I5020 Unspecified systolic (congestive) heart failure: Secondary | ICD-10-CM

## 2021-05-08 DIAGNOSIS — I429 Cardiomyopathy, unspecified: Secondary | ICD-10-CM

## 2021-05-08 LAB — RENAL FUNCTION PANEL
Albumin: 3.2 g/dL — ABNORMAL LOW (ref 3.5–5.0)
Anion gap: 10 (ref 5–15)
BUN: 57 mg/dL — ABNORMAL HIGH (ref 6–20)
CO2: 21 mmol/L — ABNORMAL LOW (ref 22–32)
Calcium: 7.7 mg/dL — ABNORMAL LOW (ref 8.9–10.3)
Chloride: 107 mmol/L (ref 98–111)
Creatinine, Ser: 6.58 mg/dL — ABNORMAL HIGH (ref 0.61–1.24)
GFR, Estimated: 9 mL/min — ABNORMAL LOW (ref >60)
Glucose, Bld: 102 mg/dL — ABNORMAL HIGH (ref 70–99)
Phosphorus: 3.8 mg/dL (ref 2.5–4.6)
Potassium: 4.1 mmol/L (ref 3.5–5.1)
Sodium: 138 mmol/L (ref 135–145)

## 2021-05-08 LAB — ENA+DNA/DS+ANTICH+CENTRO+JO...
Anti JO-1: <0.2 AI (ref 0.0–0.9)
Centromere Ab Screen: <0.2 AI (ref 0.0–0.9)
Chromatin Ab SerPl-aCnc: <0.2 AI (ref 0.0–0.9)
ENA SM Ab Ser-aCnc: <0.2 AI (ref 0.0–0.9)
Ribonucleic Protein: 5.6 AI — ABNORMAL HIGH (ref 0.0–0.9)
SSA (Ro) (ENA) Antibody, IgG: <0.2 AI (ref 0.0–0.9)
SSB (La) (ENA) Antibody, IgG: <0.2 AI (ref 0.0–0.9)
Scleroderma (Scl-70) (ENA) Antibody, IgG: <0.2 AI (ref 0.0–0.9)
ds DNA Ab: <1 IU/mL (ref 0–9)

## 2021-05-08 LAB — CBC
HCT: 33.8 % — ABNORMAL LOW (ref 39.0–52.0)
Hemoglobin: 11.2 g/dL — ABNORMAL LOW (ref 13.0–17.0)
MCH: 32.6 pg (ref 26.0–34.0)
MCHC: 33.1 g/dL (ref 30.0–36.0)
MCV: 98.3 fL (ref 80.0–100.0)
Platelets: 126 10*3/uL — ABNORMAL LOW (ref 150–400)
RBC: 3.44 MIL/uL — ABNORMAL LOW (ref 4.22–5.81)
RDW: 13.5 % (ref 11.5–15.5)
WBC: 3.3 10*3/uL — ABNORMAL LOW (ref 4.0–10.5)
nRBC: 0 % (ref 0.0–0.2)

## 2021-05-08 LAB — KAPPA/LAMBDA LIGHT CHAINS
Kappa free light chain: 145.1 mg/L — ABNORMAL HIGH (ref 3.3–19.4)
Kappa, lambda light chain ratio: 1.79 — ABNORMAL HIGH (ref 0.26–1.65)
Lambda free light chains: 81.2 mg/L — ABNORMAL HIGH (ref 5.7–26.3)

## 2021-05-08 LAB — URINE CULTURE: Culture: 40000 — AB

## 2021-05-08 LAB — GLUCOSE, CAPILLARY: Glucose-Capillary: 96 mg/dL (ref 70–99)

## 2021-05-08 LAB — MRSA NEXT GEN BY PCR, NASAL: MRSA by PCR Next Gen: NOT DETECTED

## 2021-05-08 LAB — ANA W/REFLEX IF POSITIVE: Anti Nuclear Antibody (ANA): POSITIVE — AB

## 2021-05-08 SURGERY — ARTERIOVENOUS (AV) FISTULA CREATION
Anesthesia: Choice | Laterality: Left

## 2021-05-08 MED ORDER — MIDAZOLAM HCL 2 MG/2ML IJ SOLN
INTRAMUSCULAR | Status: AC
  Filled 2021-05-08: qty 2

## 2021-05-08 MED ORDER — ISOSORBIDE DINITRATE 10 MG PO TABS
10.0000 mg | ORAL_TABLET | Freq: Three times a day (TID) | ORAL | Status: DC
  Administered 2021-05-08 – 2021-05-15 (×20): 10 mg via ORAL
  Filled 2021-05-08 (×21): qty 1

## 2021-05-08 MED ORDER — FUROSEMIDE 40 MG PO TABS
40.0000 mg | ORAL_TABLET | Freq: Every day | ORAL | Status: DC
  Administered 2021-05-08 – 2021-05-10 (×3): 40 mg via ORAL
  Filled 2021-05-08 (×3): qty 1

## 2021-05-08 MED ORDER — FENTANYL CITRATE (PF) 250 MCG/5ML IJ SOLN
INTRAMUSCULAR | Status: AC
  Filled 2021-05-08: qty 5

## 2021-05-08 MED ORDER — METOPROLOL TARTRATE 25 MG PO TABS
25.0000 mg | ORAL_TABLET | Freq: Two times a day (BID) | ORAL | Status: DC
  Administered 2021-05-08: 25 mg via ORAL
  Filled 2021-05-08: qty 1

## 2021-05-08 MED ORDER — PROPOFOL 10 MG/ML IV BOLUS
INTRAVENOUS | Status: AC
  Filled 2021-05-08: qty 20

## 2021-05-08 MED ORDER — HYDRALAZINE HCL 25 MG PO TABS
25.0000 mg | ORAL_TABLET | Freq: Three times a day (TID) | ORAL | Status: DC
  Administered 2021-05-08 – 2021-05-14 (×16): 25 mg via ORAL
  Filled 2021-05-08 (×19): qty 1

## 2021-05-08 MED ORDER — ISOSORB DINITRATE-HYDRALAZINE 20-37.5 MG PO TABS
1.0000 | ORAL_TABLET | Freq: Three times a day (TID) | ORAL | Status: DC
  Filled 2021-05-08 (×2): qty 1

## 2021-05-08 MED ORDER — ISOSORBIDE MONONITRATE 20 MG PO TABS
10.0000 mg | ORAL_TABLET | Freq: Two times a day (BID) | ORAL | Status: DC
Start: 2021-05-08 — End: 2021-05-08

## 2021-05-08 NOTE — Progress Notes (Signed)
Patient ID: Phillip Lynch, male   DOB: [birth year], 55 y.o.   MRN: [id number] [facility] Progress Note   Assessment/ Plan:   1.  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  Seen by vascular surgery with plans for left arm AV fistula which was postponed from today to possibly Monday on account of his recent influenza/respiratory status.  He does not have any dialysis indications at this time and access placement is preemptive suspecting that he will need dialysis in the next few months if GFR remains persistently low. 2.  Influenza: Started on antiviral therapy with oseltamivir for influenza along with symptomatic management with guaifenesin/Tylenol and Tessalon Perles.  3.  Hyperkalemia: Corrected with medical management/ongoing intravenous fluids. 4.  Anion gap metabolic acidosis: Monitor with volume replacement at this time. 5.  Hypertension: Blood pressure trend gradually improving, continue hydralazine 25 mg 3 times daily, metoprolol 12.5 mg twice daily and started on furosemide 40 mg daily based on discussion with Dr. Terri Skains cardiology.  Subjective:   Had an episode of weakness/dizziness earlier today while in the shower prior to being taken down to the OR.   Objective:   BP (!) 145/108 (BP Location: Right Arm)   Pulse 96   Temp 98 F (36.7 C) (Oral)   Resp 17   Wt 54.6 kg   SpO2 98%   BMI 17.78 kg/m  No intake or output data in the 24 hours ending 05/08/21 0937  Weight change:   Physical Exam: Gen: Appears to be comfortably resting in bed, watching television CVS: Pulse regular rhythm, normal rate, S1 and S2 normal Resp: Clear to auscultation bilaterally, no rales/rhonchi Abd: Soft, flat, nontender, bowel sounds normal Ext: No lower extremity edema, left antecubital IV line in place.   No asterixis  Imaging: DG Chest 2 View  Result Date: 05/06/2021 CLINICAL DATA:  Shortness of breath EXAM: CHEST - 2 VIEW COMPARISON:  None. FINDINGS: Heart is mildly enlarged. Mediastinum appears normal. No focal consolidation identified in the lungs. Pulmonary vasculature is normal. No pleural effusion or pneumothorax. IMPRESSION: Cardiomegaly with no acute process identified. Electronically Signed   By: Ofilia Neas M.D.   On: 05/06/2021 12:17   US RENAL  Result Date: 05/06/2021 CLINICAL DATA:  Echogenic left kidney.  Right kidney absent. EXAM: RENAL / URINARY TRACT ULTRASOUND COMPLETE COMPARISON:  None. FINDINGS: LEFT kidney: Renal measurements: 8.9 x 5.1 x 3.8 cm = volume: 89 mL. Echogenicity increased. No mass or hydronephrosis visualized. RIGHT kidney: Not visualized. Urinary bladder bladder: Appears normal for degree of bladder distention. Other: None. IMPRESSION: 1. Atrophic left kidney with increased echogenicity suggestive of renal parenchymal disease. 2. Absent right kidney. Electronically Signed   By: Iven Finn M.D.   On: 05/06/2021 15:29   ECHOCARDIOGRAM COMPLETE  Result Date: 05/07/2021    ECHOCARDIOGRAM REPORT   Patient Name:   Phillip Lynch Date of Exam: 05/07/2021 Medical Rec #:  [id number]     Height:       69.0 in Accession #:    [id number]    Weight:       150.8 lb Date of Birth:  08-Jul-1965     BSA:          1.832 m Patient Age:    20 years      BP:  140/96 mmHg Patient Gender: M             HR:           97 bpm. Exam Location:  Inpatient Procedure: 2D Echo, 3D Echo, Cardiac Doppler, Color Doppler and Strain Analysis Indications:    R94.31 Abnormal EKG  History:        Patient has no prior history of Echocardiogram examinations.                 Risk Factors:Hypertension.  Sonographer:    Bernadene Person RDCS Referring Phys: [id number] [place]  1. Left ventricular ejection fraction, by estimation, is 20 to 25%. The left ventricle has severely  decreased function. The left ventricle demonstrates global hypokinesis. The left ventricular internal cavity size was mildly dilated. There is moderate left ventricular hypertrophy. Left ventricular diastolic parameters are consistent with Grade I diastolic dysfunction (impaired relaxation). The average left ventricular global longitudinal strain is -9.0 %. The global longitudinal strain is abnormal. Strain is relatively preserved at the apical cap. Bright myocardium with LVH and relatively preserved apical strain suggests the possibility of cardiac amyloidosis.  2. Right ventricular systolic function is normal. The right ventricular size is normal. There is normal pulmonary artery systolic pressure. The estimated right ventricular systolic pressure is 25.8 mmHg.  3. The mitral valve is normal in structure. Trivial mitral valve regurgitation. No evidence of mitral stenosis.  4. The aortic valve is tricuspid. Aortic valve regurgitation is mild. No aortic stenosis is present.  5. Aortic dilatation noted. There is mild dilatation of the ascending aorta, measuring 37 mm.  6. The inferior vena cava is normal in size with greater than 50% respiratory variability, suggesting right atrial pressure of 3 mmHg. FINDINGS  Left Ventricle: Left ventricular ejection fraction, by estimation, is 20 to 25%. The left ventricle has severely decreased function. The left ventricle demonstrates global hypokinesis. The average left ventricular global longitudinal strain is -9.0 %. The global longitudinal strain is abnormal. The left ventricular internal cavity size was mildly dilated. There is moderate left ventricular hypertrophy. Left ventricular diastolic parameters are consistent with Grade I diastolic dysfunction (impaired relaxation). Right Ventricle: The right ventricular size is normal. No increase in right ventricular wall thickness. Right ventricular systolic function is normal. There is normal pulmonary artery systolic pressure.  The tricuspid regurgitant velocity is 1.79 m/s, and  with an assumed right atrial pressure of 3 mmHg, the estimated right ventricular systolic pressure is 52.7 mmHg. Left Atrium: Left atrial size was normal in size. Right Atrium: Right atrial size was normal in size. Pericardium: There is no evidence of pericardial effusion. Mitral Valve: The mitral valve is normal in structure. Trivial mitral valve regurgitation. No evidence of mitral valve stenosis. Tricuspid Valve: The tricuspid valve is normal in structure. Tricuspid valve regurgitation is trivial. Aortic Valve: The aortic valve is tricuspid. Aortic valve regurgitation is mild. No aortic stenosis is present. Pulmonic Valve: The pulmonic valve was normal in structure. Pulmonic valve regurgitation is not visualized. Aorta: The aortic root is normal in size and structure and aortic dilatation noted. There is mild dilatation of the ascending aorta, measuring 37 mm. Venous: The inferior vena cava is normal in size with greater than 50% respiratory variability, suggesting right atrial pressure of 3 mmHg. IAS/Shunts: No atrial level shunt detected by color flow Doppler.  LEFT VENTRICLE PLAX 2D LVIDd:         5.60 cm      Diastology LVIDs:  4.90 cm      LV e' medial:    3.31 cm/s LV PW:         1.40 cm      LV E/e' medial:  15.0 LV IVS:        0.90 cm      LV e' lateral:   3.37 cm/s LVOT diam:     2.10 cm      LV E/e' lateral: 14.7 LV SV:         43 LV SV Index:   24           2D Longitudinal Strain LVOT Area:     3.46 cm     2D Strain GLS Avg:     -9.0 %  LV Volumes (MOD) LV vol d, MOD A2C: 130.0 ml 3D Volume EF: LV vol d, MOD A4C: 161.0 ml 3D EF:        37 % LV vol s, MOD A2C: 91.1 ml  LV EDV:       246 ml LV vol s, MOD A4C: 109.0 ml LV ESV:       155 ml LV SV MOD A2C:     38.9 ml  LV SV:        91 ml LV SV MOD A4C:     161.0 ml LV SV MOD BP:      45.2 ml RIGHT VENTRICLE RV S prime:     11.90 cm/s TAPSE (M-mode): 2.4 cm LEFT ATRIUM           Index        RIGHT  ATRIUM           Index LA diam:      2.40 cm 1.31 cm/m   RA Area:     13.90 cm LA Vol (A2C): 43.5 ml 23.74 ml/m  RA Volume:   34.10 ml  18.61 ml/m LA Vol (A4C): 40.3 ml 21.99 ml/m  AORTIC VALVE LVOT Vmax:   91.30 cm/s LVOT Vmean:  56.400 cm/s LVOT VTI:    0.125 m  AORTA Ao Root diam: 3.20 cm Ao Asc diam:  3.70 cm MITRAL VALVE               TRICUSPID VALVE MV Area (PHT): 4.68 cm    TR Peak grad:   12.8 mmHg MV Decel Time: 162 msec    TR Vmax:        179.00 cm/s MV E velocity: 49.50 cm/s MV A velocity: 81.50 cm/s  SHUNTS MV E/A ratio:  0.61        Systemic VTI:  0.12 m                            Systemic Diam: 2.10 cm Dalton McleanMD Electronically signed by Franki Monte Signature Date/Time: 05/07/2021/12:37:12 PM    Final    VAS [place] UPPER EXT VEIN MAPPING (PRE-OP AVF)  Result Date: 05/07/2021 [place] MAPPING Patient Name:  CORLISS LAMARTINA  Date of Exam:   05/07/2021 Medical Rec #: [id number]      Accession #:    [id number] Date of Birth: [birth year]      Patient Gender: M Patient Age:   56 years Exam Location:  [facility] Procedure:      VAS [place] UPPER EXT VEIN MAPPING (PRE-OP AVF) Referring Phys: Mechel Schutter --------------------------------------------------------------------------------  Indications: Pre-access. History: CKD, congenital solitary kidney.  Comparison Study: No prior study Performing Technologist: Sharyn Lull  Simonetti MHA, RDMS, RVT, RDCS  Examination Guidelines: A complete evaluation includes B-mode imaging, spectral Doppler, color Doppler, and power Doppler as needed of all accessible portions of each vessel. Bilateral testing is considered an integral part of a complete examination. Limited examinations for reoccurring indications may be performed as noted. +-----------------+-------------+----------+--------------+ Right Cephalic   Diameter (cm)Depth (cm)   Findings    +-----------------+-------------+----------+--------------+ Shoulder                                 not visualized +-----------------+-------------+----------+--------------+ Prox upper arm                          not visualized +-----------------+-------------+----------+--------------+ Mid upper arm                           not visualized +-----------------+-------------+----------+--------------+ Dist upper arm                          not visualized +-----------------+-------------+----------+--------------+ Antecubital fossa                       not visualized +-----------------+-------------+----------+--------------+ Prox forearm                            not visualized +-----------------+-------------+----------+--------------+ Mid forearm                             not visualized +-----------------+-------------+----------+--------------+ Dist forearm                            not visualized +-----------------+-------------+----------+--------------+ Wrist                                   not visualized +-----------------+-------------+----------+--------------+ +-----------------+-------------+----------+--------+ Right Basilic    Diameter (cm)Depth (cm)Findings +-----------------+-------------+----------+--------+ Dist upper arm       0.45                        +-----------------+-------------+----------+--------+ Antecubital fossa    0.46                        +-----------------+-------------+----------+--------+ Prox forearm         0.20                        +-----------------+-------------+----------+--------+ +-----------------+-------------+----------+---------+ Left Cephalic    Diameter (cm)Depth (cm)Findings  +-----------------+-------------+----------+---------+ Shoulder             0.37                         +-----------------+-------------+----------+---------+ Prox upper arm       0.50                         +-----------------+-------------+----------+---------+ Mid upper arm        0.18                          +-----------------+-------------+----------+---------+ Dist upper arm       0.43                         +-----------------+-------------+----------+---------+  Antecubital fossa    0.54               branching +-----------------+-------------+----------+---------+ Prox forearm         0.40                         +-----------------+-------------+----------+---------+ Mid forearm          0.39                         +-----------------+-------------+----------+---------+ Dist forearm         0.33                         +-----------------+-------------+----------+---------+ Wrist                0.31                         +-----------------+-------------+----------+---------+ *See table(s) above for measurements and observations.  Diagnosing physician: Deitra Mayo MD Electronically signed by Deitra Mayo MD on 05/07/2021 at 5:35:11 PM.    Final     Labs: BMET Recent Labs  Lab 05/06/21 0953 05/06/21 1655 05/06/21 1700 05/07/21 0146 05/08/21 0240  NA 136  --  136 136 138  K 5.5*  --  4.7 4.0 4.1  CL 107  --  110 104 107  CO2 16*  --  16* 21* 21*  GLUCOSE 95  --  95 100* 102*  BUN 60*  --  60* 64* 57*  CREATININE 7.48* 6.76* 6.71* 6.80* 6.58*  CALCIUM 9.4  --  7.8* 7.7* 7.7*  PHOS  --  4.7*  --   --  3.8   CBC Recent Labs  Lab 05/06/21 0953 05/06/21 1655 05/07/21 0146 05/08/21 0240  WBC 4.4 4.6 3.8* 3.3*  HGB 11.9* 10.5* 10.6* 11.2*  HCT 38.2* 33.2* 32.3* 33.8*  MCV 103.0* 101.5* 98.5 98.3  PLT 153 132* 123* 126*    Medications:     furosemide  40 mg Oral Daily   guaiFENesin  600 mg Oral BID   heparin  5,000 Units Subcutaneous Q8H   hydrALAZINE  25 mg Oral Q8H   metoprolol tartrate  12.5 mg Oral BID   sodium bicarbonate  650 mg Oral BID   Elmarie Shiley, MD 05/08/2021, 9:37 AM

## 2021-05-08 NOTE — Plan of Care (Signed)
  Problem: Education: Goal: Knowledge of General Education information will improve Description: Including pain rating scale, medication(s)/side effects and non-pharmacologic comfort measures 05/08/2021 1026 by Trixie Deis, RN Outcome: Progressing 05/08/2021 1025 by Trixie Deis, RN Outcome: Progressing   Problem: Activity: Goal: Risk for activity intolerance will decrease 05/08/2021 1026 by Trixie Deis, RN Outcome: Progressing 05/08/2021 1025 by Trixie Deis, RN Outcome: Progressing   Problem: Pain Managment: Goal: General experience of comfort will improve 05/08/2021 1026 by Trixie Deis, RN Outcome: Progressing 05/08/2021 1025 by Trixie Deis, RN Outcome: Progressing   Problem: Safety: Goal: Ability to remain free from injury will improve 05/08/2021 1026 by Trixie Deis, RN Outcome: Progressing 05/08/2021 1025 by Trixie Deis, RN Outcome: Progressing

## 2021-05-08 NOTE — Progress Notes (Addendum)
Progress Note  Patient Name: Phillip Lynch Date of Encounter: 05/08/2021  Attending physician: Velna Ochs, MD Primary care provider: Patient, [name] (Inactive)  Subjective: Phillip Lynch is a 55 y.o. male who was seen and examined at bedside  No chest pain, orthopnea, paroxysmal nocturnal dyspnea lower extremity swelling Surgery rescheduled for Monday. Case discussed and reviewed with his nurse.  Objective: Vital Signs in the last 24 hours: Temp:  [97.9 F (36.6 C)-98.7 F (37.1 C)] 97.9 F (36.6 C) (11/23 1313) Pulse Rate:  [96-100] 98 (11/23 1313) Resp:  [16-17] 16 (11/23 1313) BP: (136-147)/(102-117) 147/117 (11/23 1313) SpO2:  [98 %-99 %] 99 % (11/23 1313)  Intake/Output:  Intake/Output Summary (Last 24 hours) at 05/08/2021 1742 Last data filed at 05/08/2021 1300 Gross per 24 hour  Intake 480 ml  Output --  Net 480 ml    Net IO Since Admission: 1,360.73 mL [05/08/21 1742]  Weights:  Filed Weights   05/07/21 1100  Weight: 54.6 kg    Telemetry: Personally reviewed.  Sinus tachycardia.  1 episode of NSVT  Physical examination: PHYSICAL EXAM: Vitals with BMI 05/08/2021 05/08/2021 05/07/2021  Height - - -  Weight - - -  BMI - - -  Systolic 245 809 983  Diastolic 382 505 397  Pulse 98 96 100    CONSTITUTIONAL: Well-developed and well-nourished. No acute distress.  SKIN: Skin is warm and dry. No rash noted. No cyanosis. No pallor. No jaundice HEAD: Normocephalic and atraumatic.  EYES: No scleral icterus MOUTH/THROAT: Moist oral membranes.  NECK: No JVD present. No thyromegaly noted. No carotid bruits  LYMPHATIC: No visible cervical adenopathy.  CHEST Normal respiratory effort. No intercostal retractions  LUNGS: Clear to auscultation bilaterally.  No stridor. No wheezes. No rales. CARDIOVASCULAR: Regular rate and rhythm, positive S1-S2, no murmurs rubs or gallops appreciated.  ABDOMINAL: Soft, nontender, nondistended, positive bowel sounds in  all 4 quadrants, no apparent ascites.  EXTREMITIES: Pitting edema, warm to touch, +2DP and PT pulses HEMATOLOGIC: No significant bruising NEUROLOGIC: Oriented to person, place, and time. Nonfocal. Normal muscle tone.  PSYCHIATRIC: Normal mood and affect. Normal behavior. Cooperative  Lab Results: Hematology Recent Labs  Lab 05/06/21 1655 05/07/21 0146 05/08/21 0240  WBC 4.6 3.8* 3.3*  RBC 3.27*  3.27* 3.28* 3.44*  HGB 10.5* 10.6* 11.2*  HCT 33.2* 32.3* 33.8*  MCV 101.5* 98.5 98.3  MCH 32.1 32.3 32.6  MCHC 31.6 32.8 33.1  RDW 13.5 13.4 13.5  PLT 132* 123* 126*    Chemistry Recent Labs  Lab 05/06/21 1655 05/06/21 1700 05/07/21 0146 05/08/21 0240  NA  --  136 136 138  K  --  4.7 4.0 4.1  CL  --  110 104 107  CO2  --  16* 21* 21*  GLUCOSE  --  95 100* 102*  BUN  --  60* 64* 57*  CREATININE 6.76* 6.71* 6.80* 6.58*  CALCIUM  --  7.8* 7.7* 7.7*  PROT 6.0*  --  6.1*  --   ALBUMIN 3.4*  --  3.2* 3.2*  AST 25  --  22  --   ALT 23  --  20  --   ALKPHOS 68  --  69  --   BILITOT 0.5  --  0.6  --   GFRNONAA 9* 9* 9* 9*  ANIONGAP  --  10 11 10      Cardiac Enzymes: Cardiac Panel (last 3 results) No results for input(s): CKTOTAL, CKMB, TROPONINIHS, RELINDX in the last 72  hours.  BNP (last 3 results) Recent Labs    05/07/21 1644  BNP 950.2*    ProBNP (last 3 results) No results for input(s): PROBNP in the last 8760 hours.   DDimer No results for input(s): DDIMER in the last 168 hours.   Hemoglobin A1c:  Lab Results  Component Value Date   HGBA1C 4.6 (L) 05/07/2021   MPG 85.32 05/07/2021    TSH  Recent Labs    05/07/21 1126  TSH 1.142    Lipid Panel     Component Value Date/Time   CHOL 151 05/07/2021 1644   TRIG 135 05/07/2021 1644   HDL 66 05/07/2021 1644   CHOLHDL 2.3 05/07/2021 1644   VLDL 27 05/07/2021 1644   LDLCALC 58 05/07/2021 1644    Imaging: ECHOCARDIOGRAM COMPLETE  Result Date: 05/07/2021    ECHOCARDIOGRAM REPORT   Patient Name:    Phillip Lynch Date of Exam: 05/07/2021 Medical Rec #:  [id number]     Height:       69.0 in Accession #:    [id number]    Weight:       150.8 lb Date of Birth:  [birth year]     BSA:          1.832 m Patient Age:    67 years      BP:           140/96 mmHg Patient Gender: M             HR:           97 bpm. Exam Location:  Inpatient Procedure: 2D Echo, 3D Echo, Cardiac Doppler, Color Doppler and Strain Analysis Indications:    R94.31 Abnormal EKG  History:        Patient has no prior history of Echocardiogram examinations.                 Risk Factors:Hypertension.  Sonographer:    Bernadene Person RDCS Referring Phys: [id number] [place]  1. Left ventricular ejection fraction, by estimation, is 20 to 25%. The left ventricle has severely decreased function. The left ventricle demonstrates global hypokinesis. The left ventricular internal cavity size was mildly dilated. There is moderate left ventricular hypertrophy. Left ventricular diastolic parameters are consistent with Grade I diastolic dysfunction (impaired relaxation). The average left ventricular global longitudinal strain is -9.0 %. The global longitudinal strain is abnormal. Strain is relatively preserved at the apical cap. Bright myocardium with LVH and relatively preserved apical strain suggests the possibility of cardiac amyloidosis.  2. Right ventricular systolic function is normal. The right ventricular size is normal. There is normal pulmonary artery systolic pressure. The estimated right ventricular systolic pressure is 01.5 mmHg.  3. The mitral valve is normal in structure. Trivial mitral valve regurgitation. No evidence of mitral stenosis.  4. The aortic valve is tricuspid. Aortic valve regurgitation is mild. No aortic stenosis is present.  5. Aortic dilatation noted. There is mild dilatation of the ascending aorta, measuring 37 mm.  6. The inferior vena cava is normal in size with greater than 50% respiratory variability, suggesting  right atrial pressure of 3 mmHg. FINDINGS  Left Ventricle: Left ventricular ejection fraction, by estimation, is 20 to 25%. The left ventricle has severely decreased function. The left ventricle demonstrates global hypokinesis. The average left ventricular global longitudinal strain is -9.0 %. The global longitudinal strain is abnormal. The left ventricular internal cavity size was mildly dilated. There is moderate left ventricular hypertrophy. Left ventricular diastolic parameters  are consistent with Grade I diastolic dysfunction (impaired relaxation). Right Ventricle: The right ventricular size is normal. No increase in right ventricular wall thickness. Right ventricular systolic function is normal. There is normal pulmonary artery systolic pressure. The tricuspid regurgitant velocity is 1.79 m/s, and  with an assumed right atrial pressure of 3 mmHg, the estimated right ventricular systolic pressure is 07.5 mmHg. Left Atrium: Left atrial size was normal in size. Right Atrium: Right atrial size was normal in size. Pericardium: There is no evidence of pericardial effusion. Mitral Valve: The mitral valve is normal in structure. Trivial mitral valve regurgitation. No evidence of mitral valve stenosis. Tricuspid Valve: The tricuspid valve is normal in structure. Tricuspid valve regurgitation is trivial. Aortic Valve: The aortic valve is tricuspid. Aortic valve regurgitation is mild. No aortic stenosis is present. Pulmonic Valve: The pulmonic valve was normal in structure. Pulmonic valve regurgitation is not visualized. Aorta: The aortic root is normal in size and structure and aortic dilatation noted. There is mild dilatation of the ascending aorta, measuring 37 mm. Venous: The inferior vena cava is normal in size with greater than 50% respiratory variability, suggesting right atrial pressure of 3 mmHg. IAS/Shunts: No atrial level shunt detected by color flow Doppler.  LEFT VENTRICLE PLAX 2D LVIDd:         5.60 cm       Diastology LVIDs:         4.90 cm      LV e' medial:    3.31 cm/s LV PW:         1.40 cm      LV E/e' medial:  15.0 LV IVS:        0.90 cm      LV e' lateral:   3.37 cm/s LVOT diam:     2.10 cm      LV E/e' lateral: 14.7 LV SV:         43 LV SV Index:   24           2D Longitudinal Strain LVOT Area:     3.46 cm     2D Strain GLS Avg:     -9.0 %  LV Volumes (MOD) LV vol d, MOD A2C: 130.0 ml 3D Volume EF: LV vol d, MOD A4C: 161.0 ml 3D EF:        37 % LV vol s, MOD A2C: 91.1 ml  LV EDV:       246 ml LV vol s, MOD A4C: 109.0 ml LV ESV:       155 ml LV SV MOD A2C:     38.9 ml  LV SV:        91 ml LV SV MOD A4C:     161.0 ml LV SV MOD BP:      45.2 ml RIGHT VENTRICLE RV S prime:     11.90 cm/s TAPSE (M-mode): 2.4 cm LEFT ATRIUM           Index        RIGHT ATRIUM           Index LA diam:      2.40 cm 1.31 cm/m   RA Area:     13.90 cm LA Vol (A2C): 43.5 ml 23.74 ml/m  RA Volume:   34.10 ml  18.61 ml/m LA Vol (A4C): 40.3 ml 21.99 ml/m  AORTIC VALVE LVOT Vmax:   91.30 cm/s LVOT Vmean:  56.400 cm/s LVOT VTI:    0.125 m  AORTA Ao  Root diam: 3.20 cm Ao Asc diam:  3.70 cm MITRAL VALVE               TRICUSPID VALVE MV Area (PHT): 4.68 cm    TR Peak grad:   12.8 mmHg MV Decel Time: 162 msec    TR Vmax:        179.00 cm/s MV E velocity: 49.50 cm/s MV A velocity: 81.50 cm/s  SHUNTS MV E/A ratio:  0.61        Systemic VTI:  0.12 m                            Systemic Diam: 2.10 cm Dalton McleanMD Electronically signed by Franki Monte Signature Date/Time: 05/07/2021/12:37:12 PM    Final    VAS [place] UPPER EXT VEIN MAPPING (PRE-OP AVF)  Result Date: 05/07/2021 UPPER EXTREMITY VEIN MAPPING Patient Name:  TYTUS STRAHLE  Date of Exam:   05/07/2021 Medical Rec #: [id number]      Accession #:    [id number] Date of Birth: Oct 29, 1965      Patient Gender: M Patient Age:   58 years Exam Location:  [facility] Procedure:      VAS [place] UPPER EXT VEIN MAPPING (PRE-OP AVF) Referring Phys: JAY PATEL  --------------------------------------------------------------------------------  Indications: Pre-access. History: CKD, congenital solitary kidney.  Comparison Study: No prior study Performing Technologist: Maudry Mayhew MHA, RDMS, RVT, RDCS  Examination Guidelines: A complete evaluation includes B-mode imaging, spectral Doppler, color Doppler, and power Doppler as needed of all accessible portions of each vessel. Bilateral testing is considered an integral part of a complete examination. Limited examinations for reoccurring indications may be performed as noted. +-----------------+-------------+----------+--------------+ Right Cephalic   Diameter (cm)Depth (cm)   Findings    +-----------------+-------------+----------+--------------+ Shoulder                                not visualized +-----------------+-------------+----------+--------------+ Prox upper arm                          not visualized +-----------------+-------------+----------+--------------+ Mid upper arm                           not visualized +-----------------+-------------+----------+--------------+ Dist upper arm                          not visualized +-----------------+-------------+----------+--------------+ Antecubital fossa                       not visualized +-----------------+-------------+----------+--------------+ Prox forearm                            not visualized +-----------------+-------------+----------+--------------+ Mid forearm                             not visualized +-----------------+-------------+----------+--------------+ Dist forearm                            not visualized +-----------------+-------------+----------+--------------+ Wrist                                   not visualized +-----------------+-------------+----------+--------------+ +-----------------+-------------+----------+--------+  Right Basilic    Diameter (cm)Depth (cm)Findings  +-----------------+-------------+----------+--------+ Dist upper arm       0.45                        +-----------------+-------------+----------+--------+ Antecubital fossa    0.46                        +-----------------+-------------+----------+--------+ Prox forearm         0.20                        +-----------------+-------------+----------+--------+ +-----------------+-------------+----------+---------+ Left Cephalic    Diameter (cm)Depth (cm)Findings  +-----------------+-------------+----------+---------+ Shoulder             0.37                         +-----------------+-------------+----------+---------+ Prox upper arm       0.50                         +-----------------+-------------+----------+---------+ Mid upper arm        0.18                         +-----------------+-------------+----------+---------+ Dist upper arm       0.43                         +-----------------+-------------+----------+---------+ Antecubital fossa    0.54               branching +-----------------+-------------+----------+---------+ Prox forearm         0.40                         +-----------------+-------------+----------+---------+ Mid forearm          0.39                         +-----------------+-------------+----------+---------+ Dist forearm         0.33                         +-----------------+-------------+----------+---------+ Wrist                0.31                         +-----------------+-------------+----------+---------+ *See table(s) above for measurements and observations.  Diagnosing physician: Deitra Mayo MD Electronically signed by Deitra Mayo MD on 05/07/2021 at 5:35:11 PM.    Final     CARDIAC DATABASE: CARDIAC DATABASE: EKG: 05/06/2021: Sinus tachycardia, 105 bpm, right axis deviation, biatrial enlargement, right ventricular hypertrophy without underlying injury pattern.    Echocardiogram: 05/07/2021:  1. Left ventricular ejection fraction, by estimation, is 20 to 25%. The  left ventricle has severely decreased function. The left ventricle  demonstrates global hypokinesis. The left ventricular internal cavity size  was mildly dilated. There is moderate  left ventricular hypertrophy. Left ventricular diastolic parameters are  consistent with Grade I diastolic dysfunction (impaired relaxation). The  average left ventricular global longitudinal strain is -9.0 %. The global  longitudinal strain is abnormal.  Strain is relatively preserved at the apical cap. Bright myocardium with  LVH and relatively preserved apical strain suggests the possibility of  cardiac amyloidosis.  2. Right ventricular systolic function is normal. The right ventricular  size is normal. There is normal pulmonary artery systolic pressure. The  estimated right ventricular systolic pressure is 51.0 mmHg.   3. The mitral valve is normal in structure. Trivial mitral valve  regurgitation. No evidence of mitral stenosis.   4. The aortic valve is tricuspid. Aortic valve regurgitation is mild. No  aortic stenosis is present.   5. Aortic dilatation noted. 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   6. The inferior vena cava is normal in size with greater than 50%  respiratory variability, suggesting right atrial pressure of 3 mmHg.   Scheduled Meds:  furosemide  40 mg Oral Daily   guaiFENesin  600 mg Oral BID   heparin  5,000 Units Subcutaneous Q8H   hydrALAZINE  25 mg Oral Q8H   isosorbide dinitrate  10 mg Oral TID   metoprolol tartrate  25 mg Oral BID   sodium bicarbonate  650 mg Oral BID    Continuous Infusions:   ceFAZolin (ANCEF) IV      PRN Meds: acetaminophen **OR** acetaminophen, benzonatate, ondansetron **OR** ondansetron (ZOFRAN) IV   IMPRESSION & RECOMMENDATIONS: Zerrick Hanssen is a 55 y.o. 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  Impression:  Newly discovered systolic and diastolic heart failure, stage C, NYHA class II: Differential diagnosis includes burned-out cardiomyopathy due to uncontrolled hypertension, infiltrative cardiomyopathy, viral cardiomyopathy, ischemia cannot be ruled out. Clinically appears to be compensated. Echocardiogram personally reviewed.  Right ventricular preserved.  RAP estimated at 3 mmHg. TSH within normal limits. BNP 950 AST ALT within normal limits,  Lactic acid - w/n normal limits.  Respiratory panel by PCR - Influenza A positive. Both Kappa and Lambda free light chains are elevated w/ kappa / lambda 1.79 UPEP/UIFE and serum immunofixation pending.  Urine drug screen - pending No family history of premature CAD or sudden cardiac death.  Patient denies any history of syncope. Telemetry - reviewed.  Tolerated hydralazine 25 mg p.o. 3 times daily.  Will add isosorbide dinitrate 10 mg p.o. twice daily -slowly uptitrate to Bidil dosing. Due to poor reserve will need to be careful and slow titration.  Uptitrate Lopressor to 25 mg p.o. twice daily - with goals of transitioning to Toprol-XL prior to discharge. Currently not on ACE inhibitor/ARB/Arni secondary to stage V renal disease. Currently not a candidate for Farxiga due to EGFR less than 30 mg/min per 1.  73 m. Started on Lasix per nephrology.  Will consult advanced heart failure team regarding possible cardiac amyloidosis workup and management. Also given the young age may need their services long-term if LV does not improve.  Timing of PYP will defer to AHF team.  Patient verbalizes understanding of the recommendations.  Medication access and cost needs to be accounted for as he is homeless and uninsured.    Hypertension with heart failure with stage V CKD: Educated on  the importance of better blood pressure management. Medication titration as discussed above. Volume status management per nephrology Educated on importance of low salt diet. Was scheduled for dialysis access placement; but now rescheduled to Monday.   Polysubstance abuse: Marijuana every other day Last use of cocaine 2 weeks ago Cigar regularly Patient is educated extensively that he cannot consume cocaine and beta-blockers concomitantly as there are drug to drug interactions  which can lead to worsening morbidity and mortality.  Patient verbalizes understanding.   Influenza A infection: Currently on Tamiflu.  Patient's questions and concerns were addressed to his satisfaction. He voices understanding of the instructions provided during this encounter.   This note was created using a voice recognition software as a result there may be grammatical errors inadvertently enclosed that do not reflect the nature of this encounter. Every attempt is made to correct such errors.  Mechele Claude [facility] [place]  Pager: [phone] Office: [phone] 05/08/2021, 5:42 PM

## 2021-05-08 NOTE — Progress Notes (Addendum)
Hospital day: 2  Subjective:  Phillip Lynch  is a 56 y.o. male with a history of unilateral renal agenesis, HTN, and CKD who presents with influenza and acute on chronic kidney disease.   Overnight event: None  Patient seen at bedside during morning rounds. He reports feeling weak and fatigued, especially on exertion. He continues to have a non-productive cough. He denies FH of autoimmune disease.   Objective:  Vital signs in last 24 hours: Vitals:   05/07/21 0900 05/07/21 1100 05/07/21 2108 05/08/21 0858  BP: (!) 161/105  (!) 136/102 (!) 145/108  Pulse: 98  100 96  Resp:   16 17  Temp: 98.1 F (36.7 C)  98.7 F (37.1 C) 98 F (36.7 C)  TempSrc: Oral  Oral Oral  SpO2: 99%  99% 98%  Weight:  54.6 kg      Filed Weights   05/07/21 1100  Weight: 54.6 kg    No intake or output data in the 24 hours ending 05/08/21 1118 Net IO Since Admission: 880.73 mL [05/08/21 1118]  Recent Labs    05/06/21 1111 05/07/21 0858 05/08/21 0958  GLUCAP 93 192* 96     Pertinent Labs: CBC Latest Ref Rng & Units 05/08/2021 05/07/2021 05/06/2021  WBC 4.0 - 10.5 K/uL 3.3(L) 3.8(L) 4.6  Hemoglobin 13.0 - 17.0 g/dL 11.2(L) 10.6(L) 10.5(L)  Hematocrit 39.0 - 52.0 % 33.8(L) 32.3(L) 33.2(L)  Platelets 150 - 400 K/uL 126(L) 123(L) 132(L)    CMP Latest Ref Rng & Units 05/08/2021 05/07/2021 05/06/2021  Glucose 70 - 99 mg/dL 102(H) 100(H) 95  BUN 6 - 20 mg/dL 57(H) 64(H) 60(H)  Creatinine 0.61 - 1.24 mg/dL 6.58(H) 6.80(H) 6.71(H)  Sodium 135 - 145 mmol/L 138 136 136  Potassium 3.5 - 5.1 mmol/L 4.1 4.0 4.7  Chloride 98 - 111 mmol/L 107 104 110  CO2 22 - 32 mmol/L 21(L) 21(L) 16(L)  Calcium 8.9 - 10.3 mg/dL 7.7(L) 7.7(L) 7.8(L)  Total Protein 6.5 - 8.1 g/dL - 6.1(L) -  Total Bilirubin 0.3 - 1.2 mg/dL - 0.6 -  Alkaline Phos 38 - 126 U/L - 69 -  AST 15 - 41 U/L - 22 -  ALT 0 - 44 U/L - 20 -   BNP    Component Value Date/Time   BNP 950.2 (H) 05/07/2021 1644   Hgb A1c Lab Results   Component Value Date   HGBA1C 4.6 (L) 05/07/2021   LDL Lab Results  Component Value Date   LDLCALC 58 05/07/2021   Folate, B12 Lab Results  Component Value Date   FOLATE 19.2 05/07/2021   VITAMINB12 833 05/07/2021    Imaging: ECHOCARDIOGRAM COMPLETE Result Date: 05/07/2021 ECHOCARDIOGRAM REPORT   IMPRESSIONS   1. Left ventricular ejection fraction, by estimation, is 20 to 25%. The left ventricle has severely decreased function. The left ventricle demonstrates global hypokinesis. The left ventricular internal cavity size was mildly dilated. There is moderate left ventricular hypertrophy. Left ventricular diastolic parameters are consistent with Grade I diastolic dysfunction (impaired relaxation). The average left ventricular global longitudinal strain is -9.0 %. The global longitudinal strain is abnormal. Strain is relatively preserved at the apical cap. Bright myocardium with LVH and relatively preserved apical strain suggests the possibility of cardiac amyloidosis.  2. Right ventricular systolic function is normal. The right ventricular size is normal. There is normal pulmonary artery systolic pressure. The estimated right ventricular systolic pressure is 87.8 mmHg.  3. The mitral valve is normal in structure. Trivial mitral valve regurgitation. No  evidence of mitral stenosis.   4. The aortic valve is tricuspid. Aortic valve regurgitation is mild. No aortic stenosis is present.   5. Aortic dilatation noted. There is mild dilatation of the ascending aorta, measuring 37 mm.   6. The inferior vena cava is normal in size with greater than 50% respiratory variability, suggesting right atrial pressure of 3 mmHg.   Physical Exam  General: Pleasant, alert, sitting up in bed CV: RRR, no murmurs heard  Pulmonary: Normal WOB, intermittent wheezing heard bilaterally at lung bases. Coughs with deep inspiration  Abdominal: Soft, nontender, nondistended  Extremities: No LE edema Skin: Warm, dry   Neuro: A&Ox3 Psych: Normal behavior and affect   Assessment/Plan: Phillip Lynch is a 55 y.o. male with hx of HTN, unilateral renal agenesis, and CKD presenting with influenza and acute on chronic kidney disease.   Principal Problem:   Influenza due to identified novel influenza A virus with other respiratory manifestations Active Problems:   Acute kidney injury superimposed on chronic kidney disease ([place])   Uncontrolled hypertension   Unintentional weight loss   Influenza A   HFrEF (heart failure with reduced ejection fraction) (HCC)   CKD (chronic kidney disease) stage V requiring chronic dialysis   #Newly discovered HFrEF, unclear chronicity  #Concern for cardiac amyloidosis  #HTN  Echo showed significantly reduced EF of 20-25%, LVH, and possible cardiac amyloidosis. Work-up for amyloidosis including SPEP, UPEP, IF, and FLC pending. Seen by cardiology yesterday , differential diagnosis includes cardiomyopathy 2/2 long-standing uncontrolled HTN, infiltrative cardiomyopathy, or viral cardiomyopathy. Ischemia seems unlikely given pt is asymptomatic with no ischemic changes on EKG. Clinically patient appears euvolemic and well-compensated, but has elevated BNP to 950. Patient not a candidate for cardiac MRI or guideline directed therapies including ACE/ARB/ARNI or Farxiga due to CKD stage V with GFR of 9. Cardiology considering PYP during admission and recommend L & R catheterization once renally stable.  -Cards consulted, appreciate recs -Stop amlodipine  -Start Lasix 40mg  po daily  -Start hydralazine 25mg  TID, if well tolerated goal is to transition to BiDil -Continue metoprolol 12.5mg  po BID, transition to toprol-XL on discharge  -f/u SPEP, UPEP, IF, FLC   #CKD V #Unilateral renal agenesis  #Microscopic hematuria  #Hx +ANA/+U1 RNP  Patient appears to have progression of CKD to stage V, approaching ESRD. Electrolytes stable. BUN 57, GFR 9. No urgent indications for dialysis but will  likely require HD in the next few months. Underwent vein mapping yesterday. Originally scheduled for AV fistula placement today but vascular postponed to Monday due to concerns about flu.  -Nephrology consulted, appreciate recs  -AV fistula placement Monday  -f/u ANA  -Daily renal function panel   #Influenza A Pancytopenic today with WBC 3.2, Hgb 11.2, and platelets 123 today. Likely due to viral bone marrow suppression. Will check CBC w/ diff and smear. No growth on bcx >2 days. Notes some fatigue/weakness, will have PT evaluate and support him as needed. Continue antiviral therapy and supportive care.  -Continue Tamiflu 30mg  renally dosed qod  -Guaifenesin, Tylenol, Tessalon Perles PRN  -PT eval pending  -f/u CBC w/diff, smear   #SDOH  Case manager following, appreciate assistance. Referral made for Medicaid screening and hospital f/u made with Dr. Joya Gaskins and [facility] community health and wellness on 12/15. Will check in with [facility] for patient to follow up with [facility]/whatever is easier for him.    Diet: Renally modified  IVF: None  VTE: Heparin  CODE: Full  Prior to Admission Living Arrangement:  Living with friend Anticipated Discharge Location: TBD  Barriers to Discharge: Medical stability  Dispo: Admit to inpatient with anticipated stay >2 midnights   Signed: Stefani Dama, Medical Student 05/08/2021, 10:29 AM  Internal Medicine Teaching Service [phone] Please contact the on call pager after 5 pm and on weekends at [phone].

## 2021-05-08 NOTE — Progress Notes (Addendum)
Patient tested positive for flu on 05/07/21.  Patient presents to pre-op and states that he has weakness and has a cough.  Dr. Smith Robert at bedside to evaluate patient.     Procedure will need to be rescheduled.  Dr. Trula Slade and OR desk aware.    Report given to Lauren on 5N.

## 2021-05-08 NOTE — Anesthesia Preprocedure Evaluation (Deleted)
Anesthesia Evaluation    Reviewed: Allergy & Precautions, Patient's Chart, lab work & pertinent test results  Airway        Dental   Pulmonary Current Smoker,           Cardiovascular hypertension, +CHF    Echo: 1. Left ventricular ejection fraction, by estimation, is 20 to 25%. The  left ventricle has severely decreased function. The left ventricle  demonstrates global hypokinesis. The left ventricular internal cavity size  was mildly dilated. There is moderate  left ventricular hypertrophy. Left ventricular diastolic parameters are  consistent with Grade I diastolic dysfunction (impaired relaxation). The  average left ventricular global longitudinal strain is -9.0 %. The global  longitudinal strain is abnormal.  Strain is relatively preserved at the apical cap. Bright myocardium with  LVH and relatively preserved apical strain suggests the possibility of  cardiac amyloidosis.  2. Right ventricular systolic function is normal. The right ventricular  size is normal. There is normal pulmonary artery systolic pressure. The  estimated right ventricular systolic pressure is 00.7 mmHg.  3. The mitral valve is normal in structure. Trivial mitral valve  regurgitation. No evidence of mitral stenosis.  4. The aortic valve is tricuspid. Aortic valve regurgitation is mild. No  aortic stenosis is present.  5. Aortic dilatation noted. 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  6. The inferior vena cava is normal in size with greater than 50%  respiratory variability, suggesting right atrial pressure of 3 mmHg.    Neuro/Psych negative neurological ROS  negative psych ROS   GI/Hepatic negative GI ROS, Neg liver ROS,   Endo/Other  negative endocrine ROS  Renal/GU ESRFRenal disease     Musculoskeletal negative musculoskeletal ROS (+)   Abdominal   Peds  Hematology negative hematology ROS (+)    Anesthesia Other Findings   Reproductive/Obstetrics                             Anesthesia Physical Anesthesia Plan  ASA: 4  Anesthesia Plan: MAC   Post-op Pain Management:    Induction: Intravenous  PONV Risk Score and Plan: Propofol infusion and Ondansetron  Airway Management Planned: Natural Airway and Simple Face Mask  Additional Equipment: None  Intra-op Plan:   Post-operative Plan:   Informed Consent:   Plan Discussed with: CRNA  Anesthesia Plan Comments: (Phillip Lynch is positive for the Influenza with symptoms of generalized weakness, wet cough and shortness of breath. After discussion with patient and surgeon, the surgery will be delayed.  )       Anesthesia Quick Evaluation

## 2021-05-08 NOTE — Progress Notes (Addendum)
Patient tested positive for the flu and so case was cancelled.  He has been rescheduled for Monday.   Phillip Lynch

## 2021-05-08 NOTE — Evaluation (Signed)
Physical Therapy Evaluation & Discharge Patient Details Name: Phillip Lynch MRN: [id number] DOB: [birth year] Today's Date: 05/08/2021  History of Present Illness  55 y/o male presented to ED on 11/21 with complaints of fever, chills, and body aches. Tested + for influenz A and found to have AKI on CKD. AKI felt likely 2/2 acute viral illness with recent decrease in PO intake. PMH: h/o solitary kidney, Stage IV CKD, uncontrolled HTN, polysubstance abuse.  Clinical Impression  Patient admitted with above diagnosis. Patient independent for all mobility. Patient with generalized weakness and fatigue due to recent illness. Encouraged mobility with nursing staff and mobility techs for increasing endurance in preparation for discharge. Patient unsure of discharge plan but may be staying with friends or family at discharge. No further skilled PT needs required acutely. No PT follow up recommended at this time.        Recommendations for follow up therapy are one component of a multi-disciplinary discharge planning process, led by the attending physician.  Recommendations may be updated based on patient status, additional functional criteria and insurance authorization.  Follow Up Recommendations No PT follow up    Assistance Recommended at Discharge None  Functional Status Assessment Patient has not had a recent decline in their functional status  Equipment Recommendations  None recommended by PT    Recommendations for Other Services       Precautions / Restrictions Precautions Precautions: None Precaution Comments: droplet      Mobility  Bed Mobility Overal bed mobility: Independent                  Transfers Overall transfer level: Independent                      Ambulation/Gait Ambulation/Gait assistance: Independent Gait Distance (Feet): 1000 Feet Assistive device: None            Stairs            Wheelchair Mobility    Modified Rankin (Stroke  Patients Only)       Balance Overall balance assessment: Mild deficits observed, not formally tested                                           Pertinent Vitals/Pain Pain Assessment: No/denies pain    Home Living Family/patient expects to be discharged to:: Unsure                   Additional Comments: recently homeless and staying with friends    Prior Function Prior Level of Function : Independent/Modified Independent;Driving                     Hand Dominance        Extremity/Trunk Assessment   Upper Extremity Assessment Upper Extremity Assessment: Overall WFL for tasks assessed    Lower Extremity Assessment Lower Extremity Assessment: Generalized weakness    Cervical / Trunk Assessment Cervical / Trunk Assessment: Normal  Communication   Communication: No difficulties  Cognition Arousal/Alertness: Awake/alert Behavior During Therapy: WFL for tasks assessed/performed Overall Cognitive Status: Within Functional Limits for tasks assessed                                          General Comments  Exercises     Assessment/Plan    PT Assessment Patient does not need any further PT services  PT Problem List         PT Treatment Interventions      PT Goals (Current goals can be found in the Care Plan section)  Acute Rehab PT Goals Patient Stated Goal: to get better PT Goal Formulation: All assessment and education complete, DC therapy    Frequency     Barriers to discharge        Co-evaluation               AM-PAC PT "6 Clicks" Mobility  Outcome Measure Help needed turning from your back to your side while in a flat bed without using bedrails?: None Help needed moving from lying on your back to sitting on the side of a flat bed without using bedrails?: None Help needed moving to and from a bed to a chair (including a wheelchair)?: None Help needed standing up from a chair using your  arms (e.g., wheelchair or bedside chair)?: None Help needed to walk in hospital room?: None Help needed climbing 3-5 steps with a railing? : None 6 Click Score: 24    End of Session   Activity Tolerance: Patient tolerated treatment well Patient left: in bed;with call bell/phone within reach Nurse Communication: Mobility status PT Visit Diagnosis: Muscle weakness (generalized) (M62.81)    Time: 1771-1657 PT Time Calculation (min) (ACUTE ONLY): 14 min   Charges:   PT Evaluation $PT Eval Low Complexity: 1 Low          Asli Tokarski A. Gilford Rile PT, DPT Acute Rehabilitation Services Pager [phone] Office [phone]   Linna Hoff 05/08/2021, 5:24 PM

## 2021-05-08 NOTE — Consult Note (Addendum)
Advanced Heart Failure Team Consult Note   Primary Physician: Patient, [name] (Inactive) PCP-Cardiologist:  Dr. Terri Skains   Reason for Consultation: Acute Systolic Heart Failure  HPI:    Phillip Lynch is seen today for evaluation of acute systolic heart failure at the request of Dr. Terri Skains, Cardiology.   55 y/o AAM, w/ h/o solitary kidney, Stage IV CKD, uncontrolled HTN and polysubstance abuse. Also homeless and uninsured.  Presented to ED 11/21 w/ complaints of fever, chills and body aches and tested + for influenza A and also found to have AKI on CKD w/ SCr elevated at 7.48 w/ subsequent hyperkalemia w/ K of 5.5 and acidosis w/ CO2 16. AKI felt likely 2/2 acute viral illness w/ recent decrease in PO intake. He was admitted, placed on Bicarb gtt, given Lokelma and placed on Tamiflu. EKG was obtained given hyperkalemia. EKG was abnormal and showed sinus tach 105 bpm w/ BAE and RVH. Subsequently, echocardiogram was ordered which showed severely reduced LVEF 20-25%, global HK, mild LVH w/ GIDD, normal RV and bright myocardium suggestive of cardiac amyloidosis. Unable to get cMRI due to renal function. Kappa free light chain elevated 145.1, Lambda free light chains elevated at 81.2, ratio 1.79. UPEP/UIFE and immunofixation pending. ANA +. Ribonucleic protein level elevated 5.6.  Nephrology also following. Has now progressed to stage V CKD but nonoliguric. GFR 9 mL/min.  Renal US shows no hydronephrosis.  + Atrophic left kidney with increased echogenicity suggestive of renal parenchymal disease. VVS consulted and planning AV fistula for HD access. On today's labs, SCr 6.58, BUN 57, CO2 21, K 4.1.  Also of note, pt reports unintentional 30-40 lb wt loss in the last 6 months. HIV NR. Has used cocaine in the past but denies any recent use. Social drinker. Occasionally will smoke black and mild's.   Other than upper respiratory symptoms, he denies any exertional dyspnea. No orthopnea/PND. No LEE. No  CP. No FH of CHF.    Echo 05/07/21  1. Left ventricular ejection fraction, by estimation, is 20 to 25%. The  left ventricle has severely decreased function. The left ventricle  demonstrates global hypokinesis. The left ventricular internal cavity size  was mildly dilated. There is moderate  left ventricular hypertrophy. Left ventricular diastolic parameters are  consistent with Grade I diastolic dysfunction (impaired relaxation). The  average left ventricular global longitudinal strain is -9.0 %. The global  longitudinal strain is abnormal.  Strain is relatively preserved at the apical cap. Bright myocardium with  LVH and relatively preserved apical strain suggests the possibility of  cardiac amyloidosis.   2. Right ventricular systolic function is normal. The right ventricular  size is normal. There is normal pulmonary artery systolic pressure. The  estimated right ventricular systolic pressure is 94.4 mmHg.   3. The mitral valve is normal in structure. Trivial mitral valve  regurgitation. No evidence of mitral stenosis.   4. The aortic valve is tricuspid. Aortic valve regurgitation is mild. No  aortic stenosis is present.   5. Aortic dilatation noted. 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   6. The inferior vena cava is normal in size with greater than 50%  respiratory variability, suggesting right atrial pressure of 3 mmHg.   Review of Systems: [y] = yes, [ ] = no   General: Weight gain [ ]; Weight loss [Y ]; Anorexia [ ]; Fatigue [ ]; Fever [Y ]; Chills Phillip Lynch ]; Weakness [ ]  Cardiac: Chest  pain/pressure [ ]; Resting SOB [ ]; Exertional SOB [ ]; Orthopnea [ ]; Pedal Edema [ ]; Palpitations [ ]; Syncope [ ]; Presyncope [ ]; Paroxysmal nocturnal dyspnea[ ]  Pulmonary: Cough [ Y]; Wheezing[ ]; Hemoptysis[ ]; Sputum [ ]; Snoring [ ]  GI: Vomiting[ ]; Dysphagia[ ]; Melena[ ]; Hematochezia [ ]; Heartburn[ ]; Abdominal pain [ ]; Constipation [ ]; Diarrhea [ ];  BRBPR [ ]  GU: Hematuria[ ]; Dysuria [ ]; Nocturia[ ]  Vascular: Pain in legs with walking [ ]; Pain in feet with lying flat [ ]; Non-healing sores [ ]; Stroke [ ]; TIA [ ]; Slurred speech [ ];  Neuro: Headaches[ ]; Vertigo[ ]; Seizures[ ]; Paresthesias[ ];Blurred vision [ ]; Diplopia [ ]; Vision changes [ ]  Ortho/Skin: Arthritis [ ]; Joint pain [ ]; Muscle pain [ ]; Joint swelling [ ]; Back Pain [ ]; Rash [ ]  Psych: Depression[ ]; Anxiety[ ]  Heme: Bleeding problems [ ]; Clotting disorders [ ]; Anemia [ ]  Endocrine: Diabetes [ ]; Thyroid dysfunction[ ]  Home Medications Prior to Admission medications   Medication Sig Start Date End Date Taking? Authorizing Provider  amLODipine (NORVASC) 10 MG tablet Take 1 tablet (10 mg total) by mouth daily. 07/23/19 08/22/19  Paticia Stack, MD  ferrous sulfate 325 (65 FE) MG tablet Take 1 tablet (325 mg total) by mouth daily with breakfast. 07/23/19 08/22/19  Paticia Stack, MD  hydrALAZINE (APRESOLINE) 25 MG tablet Take 1 tablet (25 mg total) by mouth 3 (three) times daily. 07/22/19 07/21/20  Paticia Stack, MD  metoprolol tartrate (LOPRESSOR) 25 MG tablet Take 0.5 tablets (12.5 mg total) by mouth 2 (two) times daily. 07/22/19 08/21/19  Paticia Stack, MD    Past Medical History: Past Medical History:  Diagnosis Date   Congenital renal agenesis, unilateral    Hypertension    Unintentional weight loss     Past Surgical History: No past surgical history on file.  Family History: Family History  Problem Relation Age of Onset   Stroke Mother    Dementia Mother    Kidney disease Mother     Social History: Social History   Socioeconomic History   Marital status: Legally Separated    Spouse name: Not on file   Number of children: Not on file   Years of education: Not on file   Highest education level: Not on file  Occupational History   Occupation: Landscaper  Tobacco Use   Smoking status: Every Day   Smokeless tobacco: Never  Substance and Sexual  Activity   Alcohol use: Yes    Comment: Weekends.   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 His father passed in March and his mother moved into a nursing facility, and unfortunately he lost ownership of his family's house so has been experiencing housing instability since early summer. Occasionally resides with friend, also gets services from [facility]. Has slept outside as well.    Social Determinants of Health   Financial Resource Strain: Not on file  Food Insecurity: Not on file  Transportation Needs: Not on file  Physical Activity: Not on file  Stress: Not on file  Social Connections: Not on file    Allergies:  Allergies  Allergen Reactions   Nsaids Other (See Comments)    Patient was born with only  1 kidney and was told to not take NSAID(s)    Objective:    Vital Signs:   Temp:  [97.9 F (36.6 C)-98.7 F (37.1 C)] 97.9 F (36.6 C) (11/23 1313) Pulse Rate:  [96-100] 98 (11/23 1313) Resp:  [16-17] 16 (11/23 1313) BP: (136-147)/(102-117) 147/117 (11/23 1313) SpO2:  [98 %-99 %] 99 % (11/23 1313) Last BM Date: 05/07/21  Weight change: Filed Weights   05/07/21 1100  Weight: 54.6 kg    Intake/Output:   Intake/Output Summary (Last 24 hours) at 05/08/2021 1532 Last data filed at 05/08/2021 1300 Gross per 24 hour  Intake 480 ml  Output --  Net 480 ml      Physical Exam    General:  Thin AAM sitting up in bed No resp difficulty HEENT: normal Neck: supple. No JVP . Carotids 2+ bilat; no bruits. No lymphadenopathy or thyromegaly appreciated. Cor: PMI nondisplaced. Regular rate & rhythm. No rubs, gallops or murmurs. Lungs: clear Abdomen: soft, nontender, nondistended. No hepatosplenomegaly. No bruits or masses. Good bowel sounds. Extremities: no cyanosis, clubbing, rash, edema Neuro: alert & orientedx3, cranial nerves grossly intact. moves all 4 extremities w/o  difficulty. Affect pleasant   Telemetry   Sinus tach 102 bpm   EKG    Sinus tach 105 bpm, w/ BAE and RVH  Labs   Basic Metabolic Panel: Recent Labs  Lab 05/06/21 0953 05/06/21 1655 05/06/21 1700 05/07/21 0146 05/08/21 0240  NA 136  --  136 136 138  K 5.5*  --  4.7 4.0 4.1  CL 107  --  110 104 107  CO2 16*  --  16* 21* 21*  GLUCOSE 95  --  95 100* 102*  BUN 60*  --  60* 64* 57*  CREATININE 7.48* 6.76* 6.71* 6.80* 6.58*  CALCIUM 9.4  --  7.8* 7.7* 7.7*  MG  --  1.8  --   --   --   PHOS  --  4.7*  --   --  3.8    Liver Function Tests: Recent Labs  Lab 05/06/21 1655 05/07/21 0146 05/08/21 0240  AST 25 22  --   ALT 23 20  --   ALKPHOS 68 69  --   BILITOT 0.5 0.6  --   PROT 6.0* 6.1*  --   ALBUMIN 3.4* 3.2* 3.2*   Recent Labs  Lab 05/06/21 0953  LIPASE 132*   No results for input(s): AMMONIA in the last 168 hours.  CBC: Recent Labs  Lab 05/06/21 0953 05/06/21 1655 05/07/21 0146 05/08/21 0240  WBC 4.4 4.6 3.8* 3.3*  HGB 11.9* 10.5* 10.6* 11.2*  HCT 38.2* 33.2* 32.3* 33.8*  MCV 103.0* 101.5* 98.5 98.3  PLT 153 132* 123* 126*    Cardiac Enzymes: No results for input(s): CKTOTAL, CKMB, CKMBINDEX, TROPONINI in the last 168 hours.  BNP: BNP (last 3 results) Recent Labs    05/07/21 1644  BNP 950.2*    ProBNP (last 3 results) No results for input(s): PROBNP in the last 8760 hours.   CBG: Recent Labs  Lab 05/06/21 1111 05/07/21 0858 05/08/21 0958  GLUCAP 93 192* 96    Coagulation Studies: No results for input(s): LABPROT, INR in the last 72 hours.   Imaging   VAS [place] UPPER EXT VEIN MAPPING (PRE-OP AVF)  Result Date: 05/07/2021 UPPER EXTREMITY VEIN MAPPING Patient Name:  MARGUIS MATHIESON  Date of Exam:   05/07/2021 Medical Rec #: [id number]      Accession #:    [id number]  Date of Birth: [birth year]      Patient Gender: M Patient Age:   55 years Exam Location:  [facility] Procedure:      VAS [place] UPPER EXT VEIN MAPPING (PRE-OP AVF)  Referring Phys: JAY PATEL --------------------------------------------------------------------------------  Indications: Pre-access. History: CKD, congenital solitary kidney.  Comparison Study: No prior study Performing Technologist: Maudry Mayhew MHA, RDMS, RVT, RDCS  Examination Guidelines: A complete evaluation includes B-mode imaging, spectral Doppler, color Doppler, and power Doppler as needed of all accessible portions of each vessel. Bilateral testing is considered an integral part of a complete examination. Limited examinations for reoccurring indications may be performed as noted. +-----------------+-------------+----------+--------------+ Right Cephalic   Diameter (cm)Depth (cm)   Findings    +-----------------+-------------+----------+--------------+ Shoulder                                not visualized +-----------------+-------------+----------+--------------+ Prox upper arm                          not visualized +-----------------+-------------+----------+--------------+ Mid upper arm                           not visualized +-----------------+-------------+----------+--------------+ Dist upper arm                          not visualized +-----------------+-------------+----------+--------------+ Antecubital fossa                       not visualized +-----------------+-------------+----------+--------------+ Prox forearm                            not visualized +-----------------+-------------+----------+--------------+ Mid forearm                             not visualized +-----------------+-------------+----------+--------------+ Dist forearm                            not visualized +-----------------+-------------+----------+--------------+ Wrist                                   not visualized +-----------------+-------------+----------+--------------+ +-----------------+-------------+----------+--------+ Right Basilic    Diameter  (cm)Depth (cm)Findings +-----------------+-------------+----------+--------+ Dist upper arm       0.45                        +-----------------+-------------+----------+--------+ Antecubital fossa    0.46                        +-----------------+-------------+----------+--------+ Prox forearm         0.20                        +-----------------+-------------+----------+--------+ +-----------------+-------------+----------+---------+ Left Cephalic    Diameter (cm)Depth (cm)Findings  +-----------------+-------------+----------+---------+ Shoulder             0.37                         +-----------------+-------------+----------+---------+ Prox upper arm       0.50                         +-----------------+-------------+----------+---------+  Mid upper arm        0.18                         +-----------------+-------------+----------+---------+ Dist upper arm       0.43                         +-----------------+-------------+----------+---------+ Antecubital fossa    0.54               branching +-----------------+-------------+----------+---------+ Prox forearm         0.40                         +-----------------+-------------+----------+---------+ Mid forearm          0.39                         +-----------------+-------------+----------+---------+ Dist forearm         0.33                         +-----------------+-------------+----------+---------+ Wrist                0.31                         +-----------------+-------------+----------+---------+ *See table(s) above for measurements and observations.  Diagnosing physician: Deitra Mayo MD Electronically signed by Deitra Mayo MD on 05/07/2021 at 5:35:11 PM.    Final      Medications:     Current Medications:  furosemide  40 mg Oral Daily   guaiFENesin  600 mg Oral BID   heparin  5,000 Units Subcutaneous Q8H   hydrALAZINE  25 mg Oral Q8H   metoprolol  tartrate  12.5 mg Oral BID   sodium bicarbonate  650 mg Oral BID    Infusions:   ceFAZolin (ANCEF) IV       Assessment/Plan   Systolic Heart Failure - newly diagnosed. 2D Echo LVEF 20-25%, global HK, mild LVH w/ GIDD, normal RV and bright myocardium suggestive of possible cardiac amyloidosis. Echo personally reviewed by Dr. Haroldine Laws and felt unlikely to be amyloid. Suspect most likely HTN CM.  - Kappa free light chain elevated 145.1, Lambda free light chains elevated at 81.2, ratio 1.79.  - UPEP/UIFE and immunofixation pending - Check Multiple Myeloma Panel  - No LHC given CKD - Will check w/ CV imaging to see if ok to do cMRI w/ Gadavist  - Arrange PYP Scan  - Appears euvolemic on exam. NYHA Class I-II  - GDMT limited by CKD. No ARB/ARNi, Spiro or dig - GFR too low for SGLT2i - on Hydral 25 mg tid. Avoid aggressive titration to reduce risk of hypotension w/ CKD  - Lopressor 12.5 mg bid. Plan to transition to either Toprol XL or Coreg  - on Lasix 40 mg daily per nephrology  - ? Candidacy for heart + renal transplant    2. AKI on Stage IV-V CKD  - Congenital Solitary Kidney  - SCr 7.5 on admit, nonoliguric - GFR 9 mL/min  - Renal US no hydronephrosis  - ? Renal biopsy  - Nephrology following. No immediate HD needs currently  - VVS to place AV fistula for future HD   3. Influenza A - Tamiflu   4. H/o Uncontrolled HTN - BPs 140s/110s - on hydrazine and Metoprolol  -  careful not to drop BP too low given CKD  5. Hyperkalemia - K 5.5 on admit, in setting of AKI  - resolved w/ Lolkelma  - K 4.1 today  - Follow BMP, Lolkelma PRN   6. SDOH  - homeless and uninsured. Social barriers could affect compliance. Will consult TOC. AHF SW team.  - will need help w/ securing housing and insurance    Length of Stay: 1  Brittainy Ladoris Gene  05/08/2021, 3:32 PM  Advanced Heart Failure Team Pager [phone] (M-F; 7a - 5p)  Please contact [facility] for night-coverage  after hours (4p -7a ) and weekends on [email or web address]  Patient seen and examined with the above-signed Advanced Practice Provider and/or Housestaff. I personally reviewed laboratory data, imaging studies and relevant notes. I independently examined the patient and formulated the important aspects of the plan. I have edited the note to reflect any of my changes or salient points. I have personally discussed the plan with the patient and/or family.  55 y/o male with severe HTN, polysubstance use and solitary kidney/CKD 4. Admitted with Flu A and progression to CKD 5 with severe HTN  Found to have new onset HF with EF 20-25% + lVH on echo but no RVH.   On ECG he has RVH/LVH and bilateral LAE  SPEP/UPEP pending. Kappa and Lambda light chains both elevated with an essentially normal ratio  General:  Thin male No resp difficulty HEENT: normal Neck: supple. no JVD. Carotids 2+ bilat; no bruits. No lymphadenopathy or thryomegaly appreciated. Cor: PMI nondisplaced. Regular tachy + s4 Lungs: clear Abdomen: soft, nontender, nondistended. No hepatosplenomegaly. No bruits or masses. Good bowel sounds. Extremities: no cyanosis, clubbing, rash, edema Neuro: alert & orientedx3, cranial nerves grossly intact. moves all 4 extremities w/o difficulty. Affect pleasant  I have reviewed echo, ecg and other studies personally. 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 Given CKD 5, cMRI is not ideal but doable if Nephrology is ok with it (newer contrast agents with very low risk  of nephrogenic systemic fibrosis). For now will await myeloma panel and get PYP. If both of these normal then suspicion for AL or TTR amyloid would be very low and would likely not need MRI given severe HTN as clear potential etiology.   Agree with BP control with hydralazine and nitrates. Would favor carvedilol over metoprolol given additional BP control but need to be careful  given very low EF.   He remains homeless. He has [facility] in [facility] the [facility]. We will engage SW to help him find a stable place to land on d/c.   Glori Bickers, MD  7:10 PM

## 2021-05-09 LAB — CBC WITH DIFFERENTIAL/PLATELET
Abs Immature Granulocytes: 0.01 10*3/uL (ref 0.00–0.07)
Basophils Absolute: 0 10*3/uL (ref 0.0–0.1)
Basophils Relative: 0 %
Eosinophils Absolute: 0 10*3/uL (ref 0.0–0.5)
Eosinophils Relative: 1 %
HCT: 32.5 % — ABNORMAL LOW (ref 39.0–52.0)
Hemoglobin: 10.5 g/dL — ABNORMAL LOW (ref 13.0–17.0)
Immature Granulocytes: 0 %
Lymphocytes Relative: 40 %
Lymphs Abs: 1.2 10*3/uL (ref 0.7–4.0)
MCH: 32.1 pg (ref 26.0–34.0)
MCHC: 32.3 g/dL (ref 30.0–36.0)
MCV: 99.4 fL (ref 80.0–100.0)
Monocytes Absolute: 0.3 10*3/uL (ref 0.1–1.0)
Monocytes Relative: 8 %
Neutro Abs: 1.5 10*3/uL — ABNORMAL LOW (ref 1.7–7.7)
Neutrophils Relative %: 51 %
Platelets: 127 10*3/uL — ABNORMAL LOW (ref 150–400)
RBC: 3.27 MIL/uL — ABNORMAL LOW (ref 4.22–5.81)
RDW: 13.5 % (ref 11.5–15.5)
WBC: 3 10*3/uL — ABNORMAL LOW (ref 4.0–10.5)
nRBC: 0 % (ref 0.0–0.2)

## 2021-05-09 LAB — GLUCOSE, CAPILLARY: Glucose-Capillary: 84 mg/dL (ref 70–99)

## 2021-05-09 LAB — RENAL FUNCTION PANEL
Albumin: 3.2 g/dL — ABNORMAL LOW (ref 3.5–5.0)
Anion gap: 9 (ref 5–15)
BUN: 63 mg/dL — ABNORMAL HIGH (ref 6–20)
CO2: 22 mmol/L (ref 22–32)
Calcium: 7.6 mg/dL — ABNORMAL LOW (ref 8.9–10.3)
Chloride: 106 mmol/L (ref 98–111)
Creatinine, Ser: 7.08 mg/dL — ABNORMAL HIGH (ref 0.61–1.24)
GFR, Estimated: 8 mL/min — ABNORMAL LOW (ref >60)
Glucose, Bld: 101 mg/dL — ABNORMAL HIGH (ref 70–99)
Phosphorus: 4.3 mg/dL (ref 2.5–4.6)
Potassium: 4.4 mmol/L (ref 3.5–5.1)
Sodium: 137 mmol/L (ref 135–145)

## 2021-05-09 LAB — PROTEIN ELECTROPHORESIS, SERUM
A/G Ratio: 1.1 (ref 0.7–1.7)
Albumin ELP: 4.1 g/dL (ref 2.9–4.4)
Alpha-1-Globulin: 0.2 g/dL (ref 0.0–0.4)
Alpha-2-Globulin: 0.9 g/dL (ref 0.4–1.0)
Beta Globulin: 1.1 g/dL (ref 0.7–1.3)
Gamma Globulin: 1.5 g/dL (ref 0.4–1.8)
Globulin, Total: 3.7 g/dL (ref 2.2–3.9)
Total Protein ELP: 7.8 g/dL (ref 6.0–8.5)

## 2021-05-09 LAB — SAVE SMEAR(SSMR), FOR PROVIDER SLIDE REVIEW

## 2021-05-09 LAB — MAGNESIUM: Magnesium: 1.8 mg/dL (ref 1.7–2.4)

## 2021-05-09 MED ORDER — CARVEDILOL 6.25 MG PO TABS
6.2500 mg | ORAL_TABLET | Freq: Two times a day (BID) | ORAL | Status: DC
  Administered 2021-05-09 – 2021-05-11 (×6): 6.25 mg via ORAL
  Filled 2021-05-09 (×7): qty 1

## 2021-05-09 MED ORDER — OSELTAMIVIR PHOSPHATE 30 MG PO CAPS: 30.0000 mg | ORAL_CAPSULE | ORAL | Status: DC

## 2021-05-09 MED ORDER — OSELTAMIVIR PHOSPHATE 30 MG PO CAPS
30.0000 mg | ORAL_CAPSULE | ORAL | Status: AC
  Administered 2021-05-10: 30 mg via ORAL
  Filled 2021-05-09: qty 1

## 2021-05-09 NOTE — Progress Notes (Signed)
5 beats of v-tach was reported by tele tech. Pt asymptomatic. Provider on call J. Olena Heckle aware. Will continue to monitor.

## 2021-05-09 NOTE — Progress Notes (Signed)
HD#3 SUBJECTIVE:  Patient Summary: Phillip Lynch is a 55 y.o. with a pertinent PMH of HTN, unilateral renal agenesis, CKD V, who presented with influenza and admitted for influenza, acute on chronic kidney disease and new onset HFrEF.   Overnight Events: No acute events overnight  Interim History: This is hospital day 3 for Phillip Lynch who was seen and evaluated at the bedside this morning. He feels much improved this morning, and notes that he was able to walk around a lot yesterday, without any DOE. His cough is improved, although still present.   OBJECTIVE:  Vital Signs: Vitals:   05/08/21 1313 05/08/21 1900 05/08/21 1926 05/09/21 0529  BP: (!) 147/117  (!) 182/117 (!) 153/104  Pulse: 98  100   Resp: 16  16   Temp: 97.9 F (36.6 C)  98.2 F (36.8 C)   TempSrc: Oral  Oral   SpO2: 99%  99%   Weight:  54.6 kg    Height:  5\' 9"  (1.753 m)     Supplemental O2: Room Air SpO2: 99 %  Filed Weights   05/07/21 1100 05/08/21 1900  Weight: 54.6 kg 54.6 kg     Intake/Output Summary (Last 24 hours) at 05/09/2021 0543 Last data filed at 05/08/2021 1300 Gross per 24 hour  Intake 480 ml  Output --  Net 480 ml   Net IO Since Admission: 1,360.73 mL [05/09/21 0543]  Physical Exam: General: Pleasant appearing male laying in bed. No acute distress. Head: Normocephalic. Atraumatic. CV: RRR. No murmurs, rubs, or gallops. No LE edema Pulmonary: Lungs CTAB. Normal effort.  Abdominal: Soft, nontender, nondistended. Normal bowel sounds. Extremities: Palpable radial and DP pulses. Normal ROM. Skin: Warm and dry.  Neuro: A&Ox3. No focal deficit. 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 HF team suspects most likely etiology of new HFrEF is hypertensive cardiomyopathy. Patient is euvolemic on evaluation, likely NYHA Class I-II. GDMT is limited for this patient given CKD. Unable to use ACEi/ARB, spiro, dig, or SGLT2i.  Kappa and lambda free light chains elevated, with ratio 1.79.  - Heart failure team consulted, appreciate recs - Consider cardiac MRI - PYP scan prior to discharge - Switch metoprolol 25 mg bid to carvedilol 6.25 mg bid today - Hydralazine 25 mg tid  - Imdur 10 mg tid - Lasix 40 mg daily   #AKI on CKD IV/V vs progression of CKD Seen by vascular surgery with plan for left arm AV fistula, however, this is delayed in the setting of positive influenza A test. Patient does not have any acute indications for dialysis at this time, however, will likely need dialysis in the near future. Of note,  ANA came back positive, with reflex RNP positive, as it was 1 year ago.  - Nephrology consulted, appreciate recs - Plan for AV fistula on Monday, 11/28 - Continue to monitor renal function panel   #Influenza A Patient is pancytopenic, with WBC 3, Hb 10.5, and platelets 127. Pancytopenia is likely secondary to viral bone marrow suppression from influenza A infection. Continue renally dosed tamiflu, 30 mg every other day for 1 more dose tomorrow. Will also continue supportive therapy with mucinex  and tessalon prn.    Best Practice: Diet: Renal diet IVF: Fluids: none VTE: heparin injection 5,000 Units Start: 05/06/21 1400 Code: Full AB: none Therapy Recs: None, DME: none DISPO: Anticipated discharge pending Medical stability.  Signature: Buddy Duty, D.O.  Internal Medicine Resident, PGY-1 [facility]  Pager: [phone] 5:43 AM, 05/09/2021   Please contact the on call pager after 5 pm and on weekends at [phone].

## 2021-05-09 NOTE — Progress Notes (Addendum)
Progress Note  Patient Name: Phillip Lynch Date of Encounter: 05/09/2021  Attending physician: Velna Ochs, MD Primary care provider: Patient, [name] (Inactive)  Subjective: [name] is a 55 y.o. male who was seen and examined at bedside  No chest pain, orthopnea, paroxysmal nocturnal dyspnea lower extremity swelling  Objective: Vital Signs in the last 24 hours: Temp:  [97.6 F (36.4 C)-98.2 F (36.8 C)] 97.6 F (36.4 C) (11/24 0734) Pulse Rate:  [92-100] 92 (11/24 0734) Resp:  [16] 16 (11/23 1926) BP: (153-182)/(102-117) 154/102 (11/24 0734) SpO2:  [98 %-99 %] 98 % (11/24 0734) Weight:  [54.6 kg] 54.6 kg (11/23 1900)  Intake/Output: No intake or output data in the 24 hours ending 05/09/21 1421   Net IO Since Admission: 1,360.73 mL [05/09/21 1421]  Weights:  Filed Weights   05/07/21 1100 05/08/21 1900  Weight: 54.6 kg 54.6 kg    Telemetry: Personally reviewed.  NSR / ST.   Physical examination: PHYSICAL EXAM: Vitals with BMI 05/09/2021 05/09/2021 05/08/2021  Height - - -  Weight - - -  BMI - - -  Systolic 242 683 419  Diastolic 622 297 989  Pulse 92 - 100    CONSTITUTIONAL: Well-developed and well-nourished. No acute distress.  SKIN: Skin is warm and dry. No rash noted. No cyanosis. No pallor. No jaundice HEAD: Normocephalic and atraumatic.  EYES: No scleral icterus MOUTH/THROAT: Moist oral membranes.  NECK: No JVD present. No thyromegaly noted. No carotid bruits  LYMPHATIC: No visible cervical adenopathy.  CHEST Normal respiratory effort. No intercostal retractions  LUNGS: Clear to auscultation bilaterally.  No stridor. No wheezes. No rales. CARDIOVASCULAR: Regular rate and rhythm, positive S1-S2, no murmurs rubs or gallops appreciated.  ABDOMINAL: Soft, nontender, nondistended, positive bowel sounds in all 4 quadrants, no apparent ascites.  EXTREMITIES: No pitting edema, warm to touch, +2DP and PT pulses HEMATOLOGIC: No significant  bruising NEUROLOGIC: Oriented to person, place, and time. Nonfocal. Normal muscle tone.  PSYCHIATRIC: Normal mood and affect. Normal behavior. Cooperative  Lab Results: Hematology Recent Labs  Lab 05/07/21 0146 05/08/21 0240 05/09/21 0206  WBC 3.8* 3.3* 3.0*  RBC 3.28* 3.44* 3.27*  HGB 10.6* 11.2* 10.5*  HCT 32.3* 33.8* 32.5*  MCV 98.5 98.3 99.4  MCH 32.3 32.6 32.1  MCHC 32.8 33.1 32.3  RDW 13.4 13.5 13.5  PLT 123* 126* 127*    Chemistry Recent Labs  Lab 05/06/21 1655 05/06/21 1700 05/07/21 0146 05/08/21 0240 05/09/21 0206  NA  --    < > 136 138 137  K  --    < > 4.0 4.1 4.4  CL  --    < > 104 107 106  CO2  --    < > 21* 21* 22  GLUCOSE  --    < > 100* 102* 101*  BUN  --    < > 64* 57* 63*  CREATININE 6.76*   < > 6.80* 6.58* 7.08*  CALCIUM  --    < > 7.7* 7.7* 7.6*  PROT 6.0*  --  6.1*  --   --   ALBUMIN 3.4*  --  3.2* 3.2* 3.2*  AST 25  --  22  --   --   ALT 23  --  20  --   --   ALKPHOS 68  --  69  --   --   BILITOT 0.5  --  0.6  --   --   GFRNONAA 9*   < > 9* 9*  8*  ANIONGAP  --    < > 11 10 9    < > = values in this interval not displayed.     Cardiac Enzymes: Cardiac Panel (last 3 results) No results for input(s): CKTOTAL, CKMB, TROPONINIHS, RELINDX in the last 72 hours.  BNP (last 3 results) Recent Labs    05/07/21 1644  BNP 950.2*    ProBNP (last 3 results) No results for input(s): PROBNP in the last 8760 hours.   DDimer No results for input(s): DDIMER in the last 168 hours.   Hemoglobin A1c:  Lab Results  Component Value Date   HGBA1C 4.6 (L) 05/07/2021   MPG 85.32 05/07/2021    TSH  Recent Labs    05/07/21 1126  TSH 1.142    Lipid Panel     Component Value Date/Time   CHOL 151 05/07/2021 1644   TRIG 135 05/07/2021 1644   HDL 66 05/07/2021 1644   CHOLHDL 2.3 05/07/2021 1644   VLDL 27 05/07/2021 1644   LDLCALC 58 05/07/2021 1644    Imaging: VAS [place] UPPER EXT VEIN MAPPING (PRE-OP AVF)  Result Date: 05/07/2021 [place] MAPPING Patient Name:  [name]  Date of Exam:   05/07/2021 Medical Rec #: [id number]      Accession #:    [id number] Date of Birth: Sep 08, 1965      Patient Gender: M Patient Age:   31 years Exam Location:  [facility] Procedure:      VAS [place] UPPER EXT VEIN MAPPING (PRE-OP AVF) Referring Phys: JAY PATEL --------------------------------------------------------------------------------  Indications: Pre-access. History: CKD, congenital solitary kidney.  Comparison Study: No prior study Performing Technologist: Maudry Mayhew MHA, RDMS, RVT, RDCS  Examination Guidelines: A complete evaluation includes B-mode imaging, spectral Doppler, color Doppler, and power Doppler as needed of all accessible portions of each vessel. Bilateral testing is considered an integral part of a complete examination. Limited examinations for reoccurring indications may be performed as noted. +-----------------+-------------+----------+--------------+ Right Cephalic   Diameter (cm)Depth (cm)   Findings    +-----------------+-------------+----------+--------------+ Shoulder                                not visualized +-----------------+-------------+----------+--------------+ Prox upper arm                          not visualized +-----------------+-------------+----------+--------------+ Mid upper arm                           not visualized +-----------------+-------------+----------+--------------+ Dist upper arm                          not visualized +-----------------+-------------+----------+--------------+ Antecubital fossa                       not visualized +-----------------+-------------+----------+--------------+ Prox forearm                            not visualized +-----------------+-------------+----------+--------------+ Mid forearm                             not visualized +-----------------+-------------+----------+--------------+ Dist forearm  not visualized +-----------------+-------------+----------+--------------+ Wrist                                   not visualized +-----------------+-------------+----------+--------------+ +-----------------+-------------+----------+--------+ Right Basilic    Diameter (cm)Depth (cm)Findings +-----------------+-------------+----------+--------+ Dist upper arm       0.45                        +-----------------+-------------+----------+--------+ Antecubital fossa    0.46                        +-----------------+-------------+----------+--------+ Prox forearm         0.20                        +-----------------+-------------+----------+--------+ +-----------------+-------------+----------+---------+ Left Cephalic    Diameter (cm)Depth (cm)Findings  +-----------------+-------------+----------+---------+ Shoulder             0.37                         +-----------------+-------------+----------+---------+ Prox upper arm       0.50                         +-----------------+-------------+----------+---------+ Mid upper arm        0.18                         +-----------------+-------------+----------+---------+ Dist upper arm       0.43                         +-----------------+-------------+----------+---------+ Antecubital fossa    0.54               branching +-----------------+-------------+----------+---------+ Prox forearm         0.40                         +-----------------+-------------+----------+---------+ Mid forearm          0.39                         +-----------------+-------------+----------+---------+ Dist forearm         0.33                         +-----------------+-------------+----------+---------+ Wrist                0.31                         +-----------------+-------------+----------+---------+ *See table(s) above for measurements and observations.  Diagnosing physician: Deitra Mayo MD Electronically signed by Deitra Mayo MD on 05/07/2021 at 5:35:11 PM.    Final     CARDIAC DATABASE: CARDIAC DATABASE: EKG: 05/06/2021: Sinus tachycardia, 105 bpm, right axis deviation, biatrial enlargement, right ventricular hypertrophy without underlying injury pattern.   Echocardiogram: 05/07/2021:  1. Left ventricular ejection fraction, by estimation, is 20 to 25%. The  left ventricle has severely decreased function. The left ventricle  demonstrates global hypokinesis. The left ventricular internal cavity size  was mildly dilated. There is moderate  left ventricular hypertrophy. Left ventricular diastolic parameters are  consistent with Grade I diastolic dysfunction (impaired relaxation). The  average left  ventricular global longitudinal strain is -9.0 %. The global  longitudinal strain is abnormal.  Strain is relatively preserved at the apical cap. Bright myocardium with  LVH and relatively preserved apical strain suggests the possibility of  cardiac amyloidosis.   2. Right ventricular systolic function is normal. The right ventricular  size is normal. There is normal pulmonary artery systolic pressure. The  estimated right ventricular systolic pressure is 91.5 mmHg.   3. The mitral valve is normal in structure. Trivial mitral valve  regurgitation. No evidence of mitral stenosis.   4. The aortic valve is tricuspid. Aortic valve regurgitation is mild. No  aortic stenosis is present.   5. Aortic dilatation noted. 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   6. The inferior vena cava is normal in size with greater than 50%  respiratory variability, suggesting right atrial pressure of 3 mmHg.   Scheduled Meds:  carvedilol  6.25 mg Oral BID WC   furosemide  40 mg Oral Daily   guaiFENesin  600 mg Oral BID   heparin  5,000 Units Subcutaneous Q8H   hydrALAZINE  25 mg Oral Q8H   isosorbide dinitrate  10 mg Oral TID   [START ON 05/10/2021]  oseltamivir  30 mg Oral QODAY   sodium bicarbonate  650 mg Oral BID    Continuous Infusions:    PRN Meds: acetaminophen **OR** acetaminophen, benzonatate, ondansetron **OR** ondansetron (ZOFRAN) IV   IMPRESSION & RECOMMENDATIONS: Tyran Huser is a 55 y.o. 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  Impression:  Newly discovered systolic and diastolic heart failure, stage C, NYHA class II: Ddx: burned-out cardiomyopathy due to uncontrolled hypertension, infiltrative CMP, ischemia cannot be ruled out. Well compensated. Echo personally reviewed.  RV preserved.  RAP estimated at 3 mmHg. TSH within normal limits. BNP 950 AST ALT within normal limits,  Lactic acid - w/n normal limits.  Respiratory panel by PCR - Influenza A positive. Kappa & Lambda free light chains are elevated w/ kappa / lambda ratio 1.79 (elevated). UPEP/UIFE and serum immunofixation pending.  PYP study ordered.  AHF team's follow up and recommendation appreciated.  No family history of premature CAD, sudden cardiac death, or syncope.  Telemetry - reviewed.  Tolerated hydralazine 25 mg p.o. 3 times daily.  Tolerated isosorbide dinitrate 10 mg p.o. tid well.  Due to poor reserve will need to be careful and slow titration of BP medications.  Transitioned from Toprol to Coreg by primary team.  Currently not on ACE inhibitor/ARB/Arni secondary to stage V renal disease. Currently not a candidate for Farxiga, eGFR <30  Diuretics managed by nephrology.  Medication access and cost needs to be accounted for as he is homeless and uninsured.    Hypertension with heart failure with stage V CKD: Educated on the importance of better blood pressure management. Medication titration as discussed above. Volume status management per nephrology Educated on  importance of low salt diet. Was scheduled for dialysis access placement; but now rescheduled to Monday.   Polysubstance abuse: Educated on complete cessation of marijuana, prn cocaine, and regular cigars.    Influenza A infection: Currently on Tamiflu.  Patient's questions and concerns were addressed to his satisfaction. He voices understanding of the instructions provided during this encounter.   This note was created using a voice recognition software as a result there may be  grammatical errors inadvertently enclosed that do not reflect the nature of this encounter. Every attempt is made to correct such errors.  Total time spent: 25 minutes.   Mechele Claude [facility]  Pager: [phone] Office: [phone] 05/09/2021, 2:21 PM

## 2021-05-09 NOTE — Progress Notes (Signed)
Patient ID: Phillip Lynch, male   DOB: 1965/07/18, 55 y.o.   MRN: [id number] [facility] Progress Note   Assessment/ Plan:   1.  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  Seen by vascular surgery with plans for left arm AV fistula which was postponed to Monday secondary to influenza status with symptoms.  He does not have any dialysis indications at this time and access placement is preemptive with high likelihood of needing dialysis in the near future. 2.  Influenza: Started on antiviral therapy with oseltamivir for influenza along with symptomatic management with guaifenesin/Tylenol and Tessalon Perles.  3.  Hyperkalemia: Corrected with medical management/ongoing intravenous fluids. 4.  Anion gap metabolic acidosis: Monitor with volume replacement at this time. 5.  Hypertension: Blood pressures remain elevated-converted to carvedilol 6.25 mg twice daily today along with ongoing hydralazine and furosemide.  Based on his heart rate, he has room to uptitrate carvedilol.  Subjective:   Denies any acute events overnight including chest pain or shortness of breath.  Informs me that he has been ambulating in his room without problems.  Objective:   BP (!) 154/102 (BP Location: Right Arm)   Pulse 92   Temp 97.6 F (36.4 C) (Oral)   Resp 16   Ht 5\' 9"  (1.753 m)   Wt 54.6 kg   SpO2 98%   BMI 17.78 kg/m   Intake/Output Summary (Last 24 hours) at 05/09/2021 0901 Last data filed at 05/08/2021 1300 Gross per 24 hour  Intake 240 ml  Output --  Net 240 ml    Weight change: -0.013 kg  Physical Exam: Gen: Appears to be comfortably resting in bed, eating breakfast CVS: Pulse regular rhythm, normal rate, S1 and S2 normal Resp: Clear to auscultation bilaterally, no rales/rhonchi Abd: Soft, flat,  nontender, bowel sounds normal Ext: No lower extremity edema, left antecubital IV line in place.  No asterixis  Imaging: ECHOCARDIOGRAM COMPLETE  Result Date: 05/07/2021    ECHOCARDIOGRAM REPORT   Patient Name:   KORON GODEAUX Date of Exam: 05/07/2021 Medical Rec #:  [id number]     Height:       69.0 in Accession #:    [id number]    Weight:       150.8 lb Date of Birth:  11-02-1965     BSA:          1.832 m Patient Age:    3 years      BP:           140/96 mmHg Patient Gender: M             HR:           97 bpm. Exam Location:  Inpatient Procedure: 2D Echo, 3D Echo, Cardiac Doppler, Color Doppler and Strain Analysis Indications:    R94.31 Abnormal EKG  History:        Patient has no prior history of Echocardiogram examinations.                 Risk Factors:Hypertension.  Sonographer:    Bernadene Person RDCS Referring Phys: [id number] [place]  1. Left ventricular ejection fraction, by estimation, is 20 to 25%. The left ventricle has severely decreased function. The left ventricle demonstrates global hypokinesis. The left ventricular internal cavity size was mildly dilated. There is moderate left ventricular hypertrophy.  Left ventricular diastolic parameters are consistent with Grade I diastolic dysfunction (impaired relaxation). The average left ventricular global longitudinal strain is -9.0 %. The global longitudinal strain is abnormal. Strain is relatively preserved at the apical cap. Bright myocardium with LVH and relatively preserved apical strain suggests the possibility of cardiac amyloidosis.  2. Right ventricular systolic function is normal. The right ventricular size is normal. There is normal pulmonary artery systolic pressure. The estimated right ventricular systolic pressure is 64.4 mmHg.  3. The mitral valve is normal in structure. Trivial mitral valve regurgitation. No evidence of mitral stenosis.  4. The aortic valve is tricuspid. Aortic valve regurgitation is mild. No aortic  stenosis is present.  5. Aortic dilatation noted. There is mild dilatation of the ascending aorta, measuring 37 mm.  6. The inferior vena cava is normal in size with greater than 50% respiratory variability, suggesting right atrial pressure of 3 mmHg. FINDINGS  Left Ventricle: Left ventricular ejection fraction, by estimation, is 20 to 25%. The left ventricle has severely decreased function. The left ventricle demonstrates global hypokinesis. The average left ventricular global longitudinal strain is -9.0 %. The global longitudinal strain is abnormal. The left ventricular internal cavity size was mildly dilated. There is moderate left ventricular hypertrophy. Left ventricular diastolic parameters are consistent with Grade I diastolic dysfunction (impaired relaxation). Right Ventricle: The right ventricular size is normal. No increase in right ventricular wall thickness. Right ventricular systolic function is normal. There is normal pulmonary artery systolic pressure. The tricuspid regurgitant velocity is 1.79 m/s, and  with an assumed right atrial pressure of 3 mmHg, the estimated right ventricular systolic pressure is 03.4 mmHg. Left Atrium: Left atrial size was normal in size. Right Atrium: Right atrial size was normal in size. Pericardium: There is no evidence of pericardial effusion. Mitral Valve: The mitral valve is normal in structure. Trivial mitral valve regurgitation. No evidence of mitral valve stenosis. Tricuspid Valve: The tricuspid valve is normal in structure. Tricuspid valve regurgitation is trivial. Aortic Valve: The aortic valve is tricuspid. Aortic valve regurgitation is mild. No aortic stenosis is present. Pulmonic Valve: The pulmonic valve was normal in structure. Pulmonic valve regurgitation is not visualized. Aorta: The aortic root is normal in size and structure and aortic dilatation noted. There is mild dilatation of the ascending aorta, measuring 37 mm. Venous: The inferior vena cava is  normal in size with greater than 50% respiratory variability, suggesting right atrial pressure of 3 mmHg. IAS/Shunts: No atrial level shunt detected by color flow Doppler.  LEFT VENTRICLE PLAX 2D LVIDd:         5.60 cm      Diastology LVIDs:         4.90 cm      LV e' medial:    3.31 cm/s LV PW:         1.40 cm      LV E/e' medial:  15.0 LV IVS:        0.90 cm      LV e' lateral:   3.37 cm/s LVOT diam:     2.10 cm      LV E/e' lateral: 14.7 LV SV:         43 LV SV Index:   24           2D Longitudinal Strain LVOT Area:     3.46 cm     2D Strain GLS Avg:     -9.0 %  LV Volumes (MOD) LV vol d, MOD  A2C: 130.0 ml 3D Volume EF: LV vol d, MOD A4C: 161.0 ml 3D EF:        37 % LV vol s, MOD A2C: 91.1 ml  LV EDV:       246 ml LV vol s, MOD A4C: 109.0 ml LV ESV:       155 ml LV SV MOD A2C:     38.9 ml  LV SV:        91 ml LV SV MOD A4C:     161.0 ml LV SV MOD BP:      45.2 ml RIGHT VENTRICLE RV S prime:     11.90 cm/s TAPSE (M-mode): 2.4 cm LEFT ATRIUM           Index        RIGHT ATRIUM           Index LA diam:      2.40 cm 1.31 cm/m   RA Area:     13.90 cm LA Vol (A2C): 43.5 ml 23.74 ml/m  RA Volume:   34.10 ml  18.61 ml/m LA Vol (A4C): 40.3 ml 21.99 ml/m  AORTIC VALVE LVOT Vmax:   91.30 cm/s LVOT Vmean:  56.400 cm/s LVOT VTI:    0.125 m  AORTA Ao Root diam: 3.20 cm Ao Asc diam:  3.70 cm MITRAL VALVE               TRICUSPID VALVE MV Area (PHT): 4.68 cm    TR Peak grad:   12.8 mmHg MV Decel Time: 162 msec    TR Vmax:        179.00 cm/s MV E velocity: 49.50 cm/s MV A velocity: 81.50 cm/s  SHUNTS MV E/A ratio:  0.61        Systemic VTI:  0.12 m                            Systemic Diam: 2.10 cm Dalton McleanMD Electronically signed by Franki Monte Signature Date/Time: 05/07/2021/12:37:12 PM    Final    VAS [place] UPPER EXT VEIN MAPPING (PRE-OP AVF)  Result Date: 05/07/2021 UPPER EXTREMITY VEIN MAPPING Patient Name:  ARGEL PABLO  Date of Exam:   05/07/2021 Medical Rec #: [id number]      Accession #:    [id number]  Date of Birth: 06/29/1965      Patient Gender: M Patient Age:   55 years Exam Location:  [facility] Procedure:      VAS [place] UPPER EXT VEIN MAPPING (PRE-OP AVF) Referring Phys: Lauris Serviss --------------------------------------------------------------------------------  Indications: Pre-access. History: CKD, congenital solitary kidney.  Comparison Study: No prior study Performing Technologist: Maudry Mayhew MHA, RDMS, RVT, RDCS  Examination Guidelines: A complete evaluation includes B-mode imaging, spectral Doppler, color Doppler, and power Doppler as needed of all accessible portions of each vessel. Bilateral testing is considered an integral part of a complete examination. Limited examinations for reoccurring indications may be performed as noted. +-----------------+-------------+----------+--------------+ Right Cephalic   Diameter (cm)Depth (cm)   Findings    +-----------------+-------------+----------+--------------+ Shoulder                                not visualized +-----------------+-------------+----------+--------------+ Prox upper arm                          not visualized +-----------------+-------------+----------+--------------+ Mid upper arm  not visualized +-----------------+-------------+----------+--------------+ Dist upper arm                          not visualized +-----------------+-------------+----------+--------------+ Antecubital fossa                       not visualized +-----------------+-------------+----------+--------------+ Prox forearm                            not visualized +-----------------+-------------+----------+--------------+ Mid forearm                             not visualized +-----------------+-------------+----------+--------------+ Dist forearm                            not visualized +-----------------+-------------+----------+--------------+ Wrist                                    not visualized +-----------------+-------------+----------+--------------+ +-----------------+-------------+----------+--------+ Right Basilic    Diameter (cm)Depth (cm)Findings +-----------------+-------------+----------+--------+ Dist upper arm       0.45                        +-----------------+-------------+----------+--------+ Antecubital fossa    0.46                        +-----------------+-------------+----------+--------+ Prox forearm         0.20                        +-----------------+-------------+----------+--------+ +-----------------+-------------+----------+---------+ Left Cephalic    Diameter (cm)Depth (cm)Findings  +-----------------+-------------+----------+---------+ Shoulder             0.37                         +-----------------+-------------+----------+---------+ Prox upper arm       0.50                         +-----------------+-------------+----------+---------+ Mid upper arm        0.18                         +-----------------+-------------+----------+---------+ Dist upper arm       0.43                         +-----------------+-------------+----------+---------+ Antecubital fossa    0.54               branching +-----------------+-------------+----------+---------+ Prox forearm         0.40                         +-----------------+-------------+----------+---------+ Mid forearm          0.39                         +-----------------+-------------+----------+---------+ Dist forearm         0.33                         +-----------------+-------------+----------+---------+ Wrist  0.31                         +-----------------+-------------+----------+---------+ *See table(s) above for measurements and observations.  Diagnosing physician: Deitra Mayo MD Electronically signed by Deitra Mayo MD on 05/07/2021 at 5:35:11 PM.    Final     Labs: BMET Recent Labs  Lab  05/06/21 0953 05/06/21 1655 05/06/21 1700 05/07/21 0146 05/08/21 0240 05/09/21 0206  NA 136  --  136 136 138 137  K 5.5*  --  4.7 4.0 4.1 4.4  CL 107  --  110 104 107 106  CO2 16*  --  16* 21* 21* 22  GLUCOSE 95  --  95 100* 102* 101*  BUN 60*  --  60* 64* 57* 63*  CREATININE 7.48* 6.76* 6.71* 6.80* 6.58* 7.08*  CALCIUM 9.4  --  7.8* 7.7* 7.7* 7.6*  PHOS  --  4.7*  --   --  3.8 4.3   CBC Recent Labs  Lab 05/06/21 1655 05/07/21 0146 05/08/21 0240 05/09/21 0206  WBC 4.6 3.8* 3.3* 3.0*  NEUTROABS  --   --   --  1.5*  HGB 10.5* 10.6* 11.2* 10.5*  HCT 33.2* 32.3* 33.8* 32.5*  MCV 101.5* 98.5 98.3 99.4  PLT 132* 123* 126* 127*    Medications:     carvedilol  6.25 mg Oral BID WC   furosemide  40 mg Oral Daily   guaiFENesin  600 mg Oral BID   heparin  5,000 Units Subcutaneous Q8H   hydrALAZINE  25 mg Oral Q8H   isosorbide dinitrate  10 mg Oral TID   [START ON 05/10/2021] oseltamivir  30 mg Oral QODAY   sodium bicarbonate  650 mg Oral BID   Elmarie Shiley, MD 05/09/2021, 9:01 AM

## 2021-05-09 NOTE — Plan of Care (Signed)

## 2021-05-09 NOTE — Progress Notes (Signed)
  [facility] TEAM NOTE  Chart reviewed. Myeloma serology still pending.   Creatinine up 6.58 -> 7.08   Remains hypertensive.   Recs: 1. Continue to titrate hydralazine/nitrates with goal BP 130-140 2. Await myeloma serologies 3. PYP ordered. Await results as well  Continue management per Dr. Terri Skains.   Glori Bickers, MD  9:59 AM

## 2021-05-10 ENCOUNTER — Inpatient Hospital Stay (HOSPITAL_COMMUNITY): Payer: Medicaid Other

## 2021-05-10 DIAGNOSIS — E43 Unspecified severe protein-calorie malnutrition: Secondary | ICD-10-CM | POA: Insufficient documentation

## 2021-05-10 LAB — RENAL FUNCTION PANEL
Albumin: 3.1 g/dL — ABNORMAL LOW (ref 3.5–5.0)
Anion gap: 9 (ref 5–15)
BUN: 64 mg/dL — ABNORMAL HIGH (ref 6–20)
CO2: 22 mmol/L (ref 22–32)
Calcium: 7.6 mg/dL — ABNORMAL LOW (ref 8.9–10.3)
Chloride: 105 mmol/L (ref 98–111)
Creatinine, Ser: 7.52 mg/dL — ABNORMAL HIGH (ref 0.61–1.24)
GFR, Estimated: 8 mL/min — ABNORMAL LOW (ref >60)
Glucose, Bld: 103 mg/dL — ABNORMAL HIGH (ref 70–99)
Phosphorus: 4 mg/dL (ref 2.5–4.6)
Potassium: 4.6 mmol/L (ref 3.5–5.1)
Sodium: 136 mmol/L (ref 135–145)

## 2021-05-10 LAB — PATHOLOGIST SMEAR REVIEW

## 2021-05-10 LAB — CBC
HCT: 29.8 % — ABNORMAL LOW (ref 39.0–52.0)
Hemoglobin: 9.6 g/dL — ABNORMAL LOW (ref 13.0–17.0)
MCH: 32.3 pg (ref 26.0–34.0)
MCHC: 32.2 g/dL (ref 30.0–36.0)
MCV: 100.3 fL — ABNORMAL HIGH (ref 80.0–100.0)
Platelets: 128 10*3/uL — ABNORMAL LOW (ref 150–400)
RBC: 2.97 MIL/uL — ABNORMAL LOW (ref 4.22–5.81)
RDW: 13.5 % (ref 11.5–15.5)
WBC: 3.5 10*3/uL — ABNORMAL LOW (ref 4.0–10.5)
nRBC: 0 % (ref 0.0–0.2)

## 2021-05-10 LAB — GLUCOSE, CAPILLARY: Glucose-Capillary: 82 mg/dL (ref 70–99)

## 2021-05-10 MED ORDER — SODIUM CHLORIDE 0.9 % IV SOLN
250.0000 mg | Freq: Every day | INTRAVENOUS | Status: AC
  Administered 2021-05-10 – 2021-05-11 (×2): 250 mg via INTRAVENOUS
  Filled 2021-05-10 (×2): qty 20

## 2021-05-10 MED ORDER — TECHNETIUM TC 99M PYROPHOSPHATE
21.0000 | Freq: Once | INTRAVENOUS | Status: AC | PRN
  Administered 2021-05-10: 21 via INTRAVENOUS
  Filled 2021-05-10: qty 21

## 2021-05-10 MED ORDER — DARBEPOETIN ALFA 60 MCG/0.3ML IJ SOSY
60.0000 ug | PREFILLED_SYRINGE | INTRAMUSCULAR | Status: DC
  Administered 2021-05-11: 23:00:00 60 ug via SUBCUTANEOUS
  Filled 2021-05-10 (×2): qty 0.3

## 2021-05-10 MED ORDER — NEPRO/CARBSTEADY PO LIQD
237.0000 mL | Freq: Two times a day (BID) | ORAL | Status: DC
  Administered 2021-05-10 – 2021-05-15 (×9): 237 mL via ORAL

## 2021-05-10 MED ORDER — FUROSEMIDE 20 MG PO TABS
20.0000 mg | ORAL_TABLET | Freq: Every day | ORAL | Status: DC
  Administered 2021-05-11: 20 mg via ORAL
  Filled 2021-05-10: qty 1

## 2021-05-10 NOTE — Progress Notes (Signed)
  [facility] TEAM NOTE  Chart reviewed. Clinically stable. SCr still on the rise.   SPEP negative - makes myeloma/AL amyloid VERY unlikely  Await PYP to assess for TTR amyloid. Will also send genetic testing on Monday.   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    Glori Bickers, MD  8:21 AM

## 2021-05-10 NOTE — Progress Notes (Signed)
Patient ID: Phillip Lynch, male   DOB: April 24, 1966, 55 y.o.   MRN: [id number] [facility] Progress Note   Assessment/ Plan:   1.  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  Seen by vascular surgery with plans for left arm AV fistula which was postponed to Monday secondary to influenza status with symptoms.  He does not have any dialysis indications at this time and access placement is preemptive with high likelihood of needing dialysis in the near future.  Creatinine higher this morning and I will decrease furosemide dose to 20 mg daily based on his current volume assessment. 2.  Influenza: Started on antiviral therapy with oseltamivir for influenza along with symptomatic management with guaifenesin/Tylenol and Tessalon Perles.  3.  Anemia: Likely secondary to chronic disease, will give intravenous iron for borderline iron stores and begin ESA. 4.  Anion gap metabolic acidosis: Monitor with volume replacement at this time. 5.  Hypertension: Blood pressures remain elevated-converted to carvedilol 6.25 mg twice daily today along with ongoing hydralazine and furosemide.  Furosemide dose decreased based on euvolemic assessment and rising creatinine.  Subjective:   Denies any acute events overnight including chest pain or shortness of breath.  Informs me that he has been ambulating in his room without problems.  Objective:   BP 127/87 (BP Location: Left Arm)   Pulse 95   Temp 98.1 F (36.7 C)   Resp 17   Ht 5\' 9"  (1.753 m)   Wt 51.4 kg   SpO2 99%   BMI 16.73 kg/m  No intake or output data in the 24 hours ending 05/10/21 0939   Weight change: -3.2 kg  Physical Exam: Gen: Appears to be comfortably resting in bed, eating breakfast CVS: Pulse regular rhythm, normal rate, S1 and S2 normal Resp: Clear  to auscultation bilaterally, no rales/rhonchi Abd: Soft, flat, nontender, bowel sounds normal Ext: No lower extremity edema, left antecubital IV line in place.  No asterixis  Imaging: No results found.  Labs: BMET Recent Labs  Lab 05/06/21 0953 05/06/21 1655 05/06/21 1700 05/07/21 0146 05/08/21 0240 05/09/21 0206 05/10/21 0140  NA 136  --  136 136 138 137 136  K 5.5*  --  4.7 4.0 4.1 4.4 4.6  CL 107  --  110 104 107 106 105  CO2 16*  --  16* 21* 21* 22 22  GLUCOSE 95  --  95 100* 102* 101* 103*  BUN 60*  --  60* 64* 57* 63* 64*  CREATININE 7.48* 6.76* 6.71* 6.80* 6.58* 7.08* 7.52*  CALCIUM 9.4  --  7.8* 7.7* 7.7* 7.6* 7.6*  PHOS  --  4.7*  --   --  3.8 4.3 4.0   CBC Recent Labs  Lab 05/07/21 0146 05/08/21 0240 05/09/21 0206 05/10/21 0140  WBC 3.8* 3.3* 3.0* 3.5*  NEUTROABS  --   --  1.5*  --   HGB 10.6* 11.2* 10.5* 9.6*  HCT 32.3* 33.8* 32.5* 29.8*  MCV 98.5 98.3 99.4 100.3*  PLT 123* 126* 127* 128*    Medications:     carvedilol  6.25 mg Oral BID WC   [START ON 05/11/2021] furosemide  20 mg Oral Daily   guaiFENesin  600 mg Oral BID   heparin  5,000 Units Subcutaneous Q8H   hydrALAZINE  25 mg Oral Q8H   isosorbide dinitrate  10 mg Oral TID   sodium bicarbonate  650 mg Oral BID   Elmarie Shiley, MD 05/10/2021, 9:39 AM

## 2021-05-10 NOTE — Plan of Care (Signed)
  Problem: Education: Goal: Knowledge of General Education information will improve Description Including pain rating scale, medication(s)/side effects and non-pharmacologic comfort measures Outcome: Progressing   Problem: Clinical Measurements: Goal: Will remain free from infection Outcome: Progressing   Problem: Coping: Goal: Level of anxiety will decrease Outcome: Progressing   Problem: Pain Managment: Goal: General experience of comfort will improve Outcome: Progressing   Problem: Safety: Goal: Ability to remain free from injury will improve Outcome: Progressing   Problem: Skin Integrity: Goal: Risk for impaired skin integrity will decrease Outcome: Progressing   
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
## 2021-05-10 NOTE — Progress Notes (Signed)
Initial Nutrition Assessment  DOCUMENTATION CODES:   Severe malnutrition in context of chronic illness, Underweight  INTERVENTION:  Provide Nepro Shake po BID, each supplement provides 425 kcal and 19 grams protein.  Encourage adequate PO intake.   NUTRITION DIAGNOSIS:   Severe Malnutrition related to chronic illness (CKDV, HF) as evidenced by estimated needs.  GOAL:   Patient will meet greater than or equal to 90% of their needs  MONITOR:   PO intake, Supplement acceptance, Labs, Weight trends, Skin, I & O's  REASON FOR ASSESSMENT:   Malnutrition Screening Tool    ASSESSMENT:   55 y.o. with a pertinent PMH of HTN, unilateral renal agenesis, CKD V, who presented with influenza and admitted for influenza, acute on chronic kidney disease and new onset HFrEF.  Meal completion has been 50-100% with 100% at breakfast this morning. Pt reports having a good appetite with usual intake of at least 2-3 meals a day with no difficulties. Pt does report unintentional weight loss over the past 8 months with usual body weight of ~140-150 lb last weighing in March 2022. Pt with a 19% weight loss in 8 months, significant for time frame. Pt agreeable to consuming nutritional supplements to aid in caloric and protein needs as well as in prevention of further weight loss. RD to order. Plans for AV fistula placement Monday, 11/28. Pt not on HD yet.   NUTRITION - FOCUSED PHYSICAL EXAM:  Flowsheet Row Most Recent Value  Orbital Region Moderate depletion  Upper Arm Region Severe depletion  Thoracic and Lumbar Region Severe depletion  Buccal Region Moderate depletion  Temple Region Moderate depletion  Clavicle Bone Region Severe depletion  Clavicle and Acromion Bone Region Severe depletion  Scapular Bone Region Unable to assess  Dorsal Hand Unable to assess  Patellar Region Moderate depletion  Anterior Thigh Region Moderate depletion  Posterior Calf Region Moderate depletion  Edema (RD  Assessment) None  Hair Unable to assess  Eyes Reviewed  Mouth Reviewed  Skin Reviewed  Nails Reviewed      Labs and medications reviewed.   Diet Order:   Diet Order             Diet renal with fluid restriction Fluid restriction: 1200 mL Fluid; Room service appropriate? Yes; Fluid consistency: Thin  Diet effective now                   EDUCATION NEEDS:   Not appropriate for education at this time  Skin:  Skin Assessment: Reviewed RN Assessment  Last BM:  11/22  Height:   Ht Readings from Last 1 Encounters:  05/08/21 5\' 9"  (1.753 m)    Weight:   Wt Readings from Last 1 Encounters:  05/10/21 51.4 kg    Ideal Body Weight:  72.7 kg  BMI:  Body mass index is 16.73 kg/m.  Estimated Nutritional Needs:   Kcal:  2000-2200  Protein:  100-110 grams  Fluid:  1.2 L/day  Corrin Parker, MS, RD, LDN RD pager number/after hours weekend pager number on Amion.

## 2021-05-10 NOTE — Progress Notes (Signed)
  PYP scan reviewed -> it is completely normal. No evidence of TTR amyloid.   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   Glori Bickers, MD  6:16 PM

## 2021-05-10 NOTE — Progress Notes (Signed)
HD#4 SUBJECTIVE:  Patient Summary: Phillip Lynch is a 55 y.o. with a pertinent PMH of HTN, unilateral renal agenesis, CKD V, who presented with influenza and admitted for influenza, acute on chronic kidney disease and new onset HFrEF.   Overnight Events: No acute events overnight  Interim History: This is hospital day 4 for Phillip Lynch who was seen and evaluated at the bedside this morning. He feels very well overall, and has been ambulating in his room without any SOB or chest pain. His cough is improved. No acute complaints.   OBJECTIVE:  Vital Signs: Vitals:   05/09/21 0734 05/09/21 1655 05/09/21 1927 05/10/21 0500  BP: (!) 154/102 (!) 138/98 127/87   Pulse: 92 91 95   Resp:   17   Temp: 97.6 F (36.4 C) 97.7 F (36.5 C) 98.1 F (36.7 C)   TempSrc: Oral     SpO2: 98% 100% 99%   Weight:    51.4 kg  Height:       Supplemental O2: Room Air SpO2: 99 %  Filed Weights   05/07/21 1100 05/08/21 1900 05/10/21 0500  Weight: 54.6 kg 54.6 kg 51.4 kg    No intake or output data in the 24 hours ending 05/10/21 0617 Net IO Since Admission: 1,360.73 mL [05/10/21 0617]  Physical Exam: General: No acute distress. Appears comfortable laying in bed. CV: RRR. No murmurs, rubs, or gallops. No LE edema Pulmonary: Lungs CTAB. Normal effort. Abdominal: Soft, nontender, nondistended. Normal bowel sounds. Extremities: Palpable radial and DP pulses. Normal ROM. Skin: Warm and dry. No obvious rash or lesions. Neuro: A&Ox3. No focal deficit. 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  - Heart failure team consulted, appreciate recs - Consider cardiac MRI - PYP scan order - Carvedilol 6.25 mg bid + hydralazine 25 mg tid + isordil 10 mg tid - Reduce lasix to 20 mg daily given Cr increase  #AKI on CKD IV/V vs progression of CKD Patient does not have any indications for dialysis now, however, he has a high likelihood of needing it in the near future. Patient was seen by vascular surgery with plan for left arm AV fistula, however, this is delayed in the setting of positive influenza A test. - Nephrology consulted, appreciate recs - AV fistula on Monday - Monitor renal function panel  - Reduce lasix dose as above  #Influenza A Patient is pancytopenic, with WBC 3.5, Hb 9.6, and platelets 128. Pancytopenia is likely secondary to viral bone marrow suppression from influenza A infection. Received renally dosed tamiflu, 30 mg every other day for 5 days (course completed). Will continue supportive  therapy with mucinex and tessalon prn.    Best Practice: Diet: Renal diet IVF: Fluids: none VTE: heparin injection 5,000 Units Start: 05/06/21 1400 Code: Full AB: None Therapy Recs: None, DME: none DISPO: Anticipated discharge pending Medical stability.  Signature: Buddy Duty, D.O.  Internal Medicine Resident, PGY-1 [facility]  Pager: [phone] 6:17 AM, 05/10/2021   Please contact the on call pager  after 5 pm and on weekends at [phone].

## 2021-05-11 LAB — RENAL FUNCTION PANEL
Albumin: 3.1 g/dL — ABNORMAL LOW (ref 3.5–5.0)
Anion gap: 9 (ref 5–15)
BUN: 73 mg/dL — ABNORMAL HIGH (ref 6–20)
CO2: 21 mmol/L — ABNORMAL LOW (ref 22–32)
Calcium: 7.8 mg/dL — ABNORMAL LOW (ref 8.9–10.3)
Chloride: 106 mmol/L (ref 98–111)
Creatinine, Ser: 7.97 mg/dL — ABNORMAL HIGH (ref 0.61–1.24)
GFR, Estimated: 7 mL/min — ABNORMAL LOW (ref >60)
Glucose, Bld: 103 mg/dL — ABNORMAL HIGH (ref 70–99)
Phosphorus: 4.4 mg/dL (ref 2.5–4.6)
Potassium: 4.6 mmol/L (ref 3.5–5.1)
Sodium: 136 mmol/L (ref 135–145)

## 2021-05-11 LAB — CULTURE, BLOOD (ROUTINE X 2)
Culture: NO GROWTH
Culture: NO GROWTH
Special Requests: ADEQUATE

## 2021-05-11 LAB — GLUCOSE, CAPILLARY: Glucose-Capillary: 93 mg/dL (ref 70–99)

## 2021-05-11 LAB — IRON AND TIBC
Iron: 222 ug/dL — ABNORMAL HIGH (ref 45–182)
Saturation Ratios: 88 % — ABNORMAL HIGH (ref 17.9–39.5)
TIBC: 252 ug/dL (ref 250–450)
UIBC: 30 ug/dL

## 2021-05-11 LAB — FERRITIN: Ferritin: 454 ng/mL — ABNORMAL HIGH (ref 24–336)

## 2021-05-11 LAB — CBC
HCT: 28.2 % — ABNORMAL LOW (ref 39.0–52.0)
Hemoglobin: 8.9 g/dL — ABNORMAL LOW (ref 13.0–17.0)
MCH: 32 pg (ref 26.0–34.0)
MCHC: 31.6 g/dL (ref 30.0–36.0)
MCV: 101.4 fL — ABNORMAL HIGH (ref 80.0–100.0)
Platelets: 148 10*3/uL — ABNORMAL LOW (ref 150–400)
RBC: 2.78 MIL/uL — ABNORMAL LOW (ref 4.22–5.81)
RDW: 13.5 % (ref 11.5–15.5)
WBC: 3.7 10*3/uL — ABNORMAL LOW (ref 4.0–10.5)
nRBC: 0 % (ref 0.0–0.2)

## 2021-05-11 NOTE — Progress Notes (Addendum)
HD#5 SUBJECTIVE:  Patient Summary: Phillip Lynch is a 55 y.o. with a pertinent PMH of HTN, unilateral renal agenesis, CKD V, who presented with influenza and admitted for influenza, acute on chronic kidney disease and new onset HFrEF.   Overnight Events: No acute events overnight.  Interim History: This is hospital day 5 for Phillip Lynch who was seen and evaluated at the bedside this morning. He feels great his AM and notes that his cough is better. He does endorse a slight right sided HA from overnight, but this is improved. Patient continues to make urine, but notes that he is urinating less frequently, although he feels like he is making the same amount of urine. Continues to have bowel movements and denies any hematochezia or melena.   OBJECTIVE:  Vital Signs: Vitals:   05/10/21 1322 05/10/21 1933 05/11/21 0449 05/11/21 0504  BP: (!) 139/105 (!) 124/98 (!) 138/98   Pulse: 85 88 80   Resp: 18 16    Temp: 98.2 F (36.8 C) 98.6 F (37 C)    TempSrc:  Oral    SpO2: 100% 97%    Weight:    55.8 kg  Height:       Supplemental O2: Room Air SpO2: 97 %  Filed Weights   05/08/21 1900 05/10/21 0500 05/11/21 0504  Weight: 54.6 kg 51.4 kg 55.8 kg     Intake/Output Summary (Last 24 hours) at 05/11/2021 0611 Last data filed at 05/11/2021 0300 Gross per 24 hour  Intake 781.11 ml  Output --  Net 781.11 ml   Net IO Since Admission: 2,141.84 mL [05/11/21 0611]  Physical Exam: General: Pleasant, well-appearing male laying in bed. No acute distress. CV: RRR. No murmurs, rubs, or gallops. No LE edema Pulmonary: Lungs CTAB. Normal effort. Abdominal: Soft, nontender, nondistended. Normal bowel sounds. Extremities: Palpable radial and DP pulses. Normal ROM. Skin: Warm and dry.  Neuro: A&Ox3.No focal deficit. 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 PYP scan performed this admission and it is unremarkable, thus the diagnosis of is ruled out making hypertensive cardiomyopathy the most likely culprit of this patient's new onset HFrEF.  - HF team consulted, appreciate recs - Carvedilol 6.25 mg bid + hydral 25 mg tid + isordil 10 mg tid - Lasix 20 mg daily   #AKI on CKD IV/V vs progression of CKD No emergent indications for dialysis at this time, however, the patient will likely need it in the future. Plan for AV fistula with vascular surgery on Monday. - Nephro consulted, appreciate recs - AV fistula 11/28 - Monitor renal function panel  #Influenza A Patient remains pancytopenic: WBC 3.7, Hb 8.9, platelets 148. Finished course  of renally dosed tamiflu yesterday, will continue supportive therapy with mucinex and tessalon as needed, although patient clinically is improving.   #Anemia Possibly related to anemia of chronic disease. Hb 11.9 on admission, and down to 8.9 this morning. Iron elevated at 222, and ferritin elevated at 454. Patient has received 2 doses of IV iron this admission.   Best Practice: Diet: Renal diet IVF: Fluids: none VTE: heparin  injection 5,000 Units Start: 05/06/21 1400 Code: Full AB: None Therapy Recs: None, DME: none DISPO: Anticipated discharge pending  clinical improvement .  Signature: Buddy Duty, D.O.  Internal Medicine Resident, PGY-1 [facility]  Pager: [phone] 6:11 AM, 05/11/2021   Please contact the on call pager after 5 pm and on weekends at [phone].

## 2021-05-11 NOTE — Progress Notes (Signed)
Progress Note  Patient Name: Phillip Lynch Date of Encounter: 05/11/2021  Attending physician: Lucious Groves, DO Primary care provider: Patient, [name] (Inactive)  Subjective: Phillip Lynch is a 55 y.o. male who was seen and examined at bedside  No chest pain, orthopnea, paroxysmal nocturnal dyspnea lower extremity swelling Non-productive cough  Objective: Vital Signs in the last 24 hours: Temp:  [98.2 F (36.8 C)-98.6 F (37 C)] 98.5 F (36.9 C) (11/26 0836) Pulse Rate:  [80-90] 90 (11/26 0836) Resp:  [16-18] 16 (11/25 1933) BP: (124-156)/(98-105) 156/99 (11/26 0836) SpO2:  [97 %-100 %] 99 % (11/26 0836) Weight:  [55.8 kg] 55.8 kg (11/26 0504)  Intake/Output:  Intake/Output Summary (Last 24 hours) at 05/11/2021 1300 Last data filed at 05/11/2021 0300 Gross per 24 hour  Intake 541.11 ml  Output --  Net 541.11 ml     Net IO Since Admission: 2,141.84 mL [05/11/21 1300]  Weights:  Filed Weights   05/08/21 1900 05/10/21 0500 05/11/21 0504  Weight: 54.6 kg 51.4 kg 55.8 kg    Telemetry: Personally reviewed.  NSR / ST.   Physical examination: PHYSICAL EXAM: Vitals with BMI 05/11/2021 05/11/2021 05/11/2021  Height - - -  Weight - 123 lbs -  BMI - 24.40 -  Systolic 102 - 725  Diastolic 99 - 98  Pulse 90 - 80    CONSTITUTIONAL: Well-developed and well-nourished. No acute distress.  SKIN: Skin is warm and dry. No rash noted. No cyanosis. No pallor. No jaundice HEAD: Normocephalic and atraumatic.  EYES: No scleral icterus MOUTH/THROAT: Moist oral membranes.  NECK: No JVD present. No thyromegaly noted. No carotid bruits  LYMPHATIC: No visible cervical adenopathy.  CHEST Normal respiratory effort. No intercostal retractions  LUNGS: Clear to auscultation bilaterally.  No stridor. No wheezes. No rales. CARDIOVASCULAR: Regular rate and rhythm, positive S1-S2, no murmurs rubs or gallops appreciated.  ABDOMINAL: Soft, nontender, nondistended, positive bowel sounds  in all 4 quadrants, no apparent ascites.  EXTREMITIES: No pitting edema, warm to touch, +2DP and PT pulses HEMATOLOGIC: No significant bruising NEUROLOGIC: Oriented to person, place, and time. Nonfocal. Normal muscle tone.  PSYCHIATRIC: Normal mood and affect. Normal behavior. Cooperative  Lab Results: Hematology Recent Labs  Lab 05/09/21 0206 05/10/21 0140 05/11/21 0149  WBC 3.0* 3.5* 3.7*  RBC 3.27* 2.97* 2.78*  HGB 10.5* 9.6* 8.9*  HCT 32.5* 29.8* 28.2*  MCV 99.4 100.3* 101.4*  MCH 32.1 32.3 32.0  MCHC 32.3 32.2 31.6  RDW 13.5 13.5 13.5  PLT 127* 128* 148*    Chemistry Recent Labs  Lab 05/06/21 1655 05/06/21 1700 05/07/21 0146 05/08/21 0240 05/09/21 0206 05/10/21 0140 05/11/21 0149  NA  --    < > 136   < > 137 136 136  K  --    < > 4.0   < > 4.4 4.6 4.6  CL  --    < > 104   < > 106 105 106  CO2  --    < > 21*   < > 22 22 21*  GLUCOSE  --    < > 100*   < > 101* 103* 103*  BUN  --    < > 64*   < > 63* 64* 73*  CREATININE 6.76*   < > 6.80*   < > 7.08* 7.52* 7.97*  CALCIUM  --    < > 7.7*   < > 7.6* 7.6* 7.8*  PROT 6.0*  --  6.1*  --   --   --   --  ALBUMIN 3.4*  --  3.2*   < > 3.2* 3.1* 3.1*  AST 25  --  22  --   --   --   --   ALT 23  --  20  --   --   --   --   ALKPHOS 68  --  69  --   --   --   --   BILITOT 0.5  --  0.6  --   --   --   --   GFRNONAA 9*   < > 9*   < > 8* 8* 7*  ANIONGAP  --    < > 11   < > _0 < > = values in this interval not displayed.     Cardiac Enzymes: Cardiac Panel (last 3 results) No results for input(s): CKTOTAL, CKMB, TROPONINIHS, RELINDX in the last 72 hours.  BNP (last 3 results) Recent Labs    05/07/21 1644  BNP 950.2*    ProBNP (last 3 results) No results for input(s): PROBNP in the last 8760 hours.   DDimer No results for input(s): DDIMER in the last 168 hours.   Hemoglobin A1c:  Lab Results  Component Value Date   HGBA1C 4.6 (L) 05/07/2021   MPG 85.32 05/07/2021    TSH  Recent Labs    05/07/21 1126   TSH 1.142    Lipid Panel     Component Value Date/Time   CHOL 151 05/07/2021 1644   TRIG 135 05/07/2021 1644   HDL 66 05/07/2021 1644   CHOLHDL 2.3 05/07/2021 1644   VLDL 27 05/07/2021 1644   LDLCALC 58 05/07/2021 1644    Imaging: NM CARDIAC AMYLOID TUMOR LOC INFLAM SPECT 1 DAY  Result Date: 05/10/2021 CLINICAL DATA:  HEART FAILURE. CONCERN FOR CARDIAC AMYLOIDOSIS. EXAM: NUCLEAR MEDICINE TUMOR LOCALIZATION. PYP CARDIAC AMYLOIDOSIS SCAN WITH SPECT TECHNIQUE: Following intravenous administration of radiopharmaceutical, anterior planar images of the chest were obtained. Regions of interest were placed on the heart and contralateral chest wall for quantitative assessment. Additional SPECT imaging of the chest was obtained. RADIOPHARMACEUTICALS:  8 mCi TECHNETIUM 99 PYROPHOSPHATE FINDINGS: Planar Visual assessment: Anterior planar imaging demonstrates radiotracer uptake within the heart less than uptake within the adjacent ribs (Grade 1). Quantitative assessment : Quantitative assessment of the cardiac uptake compared to the contralateral chest wall is equal to 1.22 (H/CL = 1.22) . SPECT assessment: SPECT imaging of the chest demonstrates faint radiotracer accumulation within the LEFT ventricle. IMPRESSION: Visual and quantitative assessment (grade 1, H/CLL equal 1.22) are equivocally suggestive of transthyretin amyloidosis. Electronically Signed   By: Lavonia Dana M.D.   On: 05/10/2021 14:22    CARDIAC DATABASE: CARDIAC DATABASE: EKG: 05/06/2021: Sinus tachycardia, 105 bpm, right axis deviation, biatrial enlargement, right ventricular hypertrophy without underlying injury pattern.   Echocardiogram: 05/07/2021:  1. Left ventricular ejection fraction, by estimation, is 20 to 25%. The  left ventricle has severely decreased function. The left ventricle  demonstrates global hypokinesis. The left ventricular internal cavity size  was mildly dilated. There is moderate  left ventricular  hypertrophy. Left ventricular diastolic parameters are  consistent with Grade I diastolic dysfunction (impaired relaxation). The  average left ventricular global longitudinal strain is -9.0 %. The global  longitudinal strain is abnormal.  Strain is relatively preserved at the apical cap. Bright myocardium with  LVH and relatively preserved apical strain suggests the possibility of  cardiac amyloidosis.   2. Right ventricular systolic function is normal. The  right ventricular  size is normal. There is normal pulmonary artery systolic pressure. The  estimated right ventricular systolic pressure is 49.7 mmHg.   3. The mitral valve is normal in structure. Trivial mitral valve  regurgitation. No evidence of mitral stenosis.   4. The aortic valve is tricuspid. Aortic valve regurgitation is mild. No  aortic stenosis is present.   5. Aortic dilatation noted. 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   6. The inferior vena cava is normal in size with greater than 50%  respiratory variability, suggesting right atrial pressure of 3 mmHg.   Scheduled Meds:  carvedilol  6.25 mg Oral BID WC   darbepoetin (ARANESP) injection - NON-DIALYSIS  60 mcg Subcutaneous Q Sat-1800   feeding supplement (NEPRO CARB STEADY)  237 mL Oral BID BM   guaiFENesin  600 mg Oral BID   heparin  5,000 Units Subcutaneous Q8H   hydrALAZINE  25 mg Oral Q8H   isosorbide dinitrate  10 mg Oral TID   sodium bicarbonate  650 mg Oral BID    Continuous Infusions:  PRN Meds: acetaminophen **OR** acetaminophen, benzonatate, ondansetron **OR** ondansetron (ZOFRAN) IV   IMPRESSION & RECOMMENDATIONS: Phillip Lynch is a 55 y.o. 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  Impression:  Newly discovered systolic and diastolic heart failure,  stage C, NYHA class II: Most likely due to Hypertensive cardiomyopathy. Well compensated. Echo personally reviewed.  RV preserved.  RAP estimated at 3 mmHg. Respiratory panel by PCR - Influenza A positive. PYP Scan not suggestive of TTR amyloid.  May consider outpatient hematology consult due to elevated Kappa & Lambda free light chains as well as the ratio.  AHF team's recommendation appreciated.  No family history of premature CAD, sudden cardiac death, or syncope.  Telemetry - reviewed.  Continue Coreg, hydralazine and isosorbide dinitrate for now. May titrate up if needed but recommend close Cr monitoring  Currently not on ACE inhibitor/ARB/Arni secondary to stage V renal disease. Currently not a candidate for Farxiga, eGFR <30  Diuretics managed by nephrology - lasix has been d/c due to morning Cr levels.  Medication access and cost needs to be accounted for as he is homeless and uninsured.  Will schedule outpatient follow up for now.  Will follow peripherally.    Hypertension with heart failure with stage V CKD: Educated on the importance of better blood pressure management. Medication titration as discussed above. Volume status management per nephrology Educated on importance of low salt diet. Was scheduled for dialysis access placement on Monday.   Polysubstance abuse: Educated on complete cessation of marijuana, prn cocaine, and regular cigars.    Influenza A infection: Has received Tamiflu.  Patient's questions and concerns were addressed to his satisfaction. He voices understanding of the instructions provided during this encounter.   This note was created using a voice recognition software as a result there may be grammatical errors inadvertently enclosed that do not reflect the nature of this encounter. Every attempt is made to correct such errors.  Plan of care discussed with primary team ([facility] residents Dr. Alfonse Spruce after rounds). Please reach out if any questions or  concerns arise. Will follow peripherally.   Total time spent: 25 minutes.   Mechele Claude [facility]  Pager: [phone] Office: [phone] 05/11/2021, 1:00 PM

## 2021-05-11 NOTE — Plan of Care (Signed)
  Problem: Education: Goal: Knowledge of General Education information will improve Description: Including pain rating scale, medication(s)/side effects and non-pharmacologic comfort measures Outcome: Progressing   Problem: Clinical Measurements: Goal: Will remain free from infection Outcome: Progressing   Problem: Nutrition: Goal: Adequate nutrition will be maintained Outcome: Progressing   Problem: Pain Managment: Goal: General experience of comfort will improve Outcome: Progressing   Problem: Safety: Goal: Ability to remain free from injury will improve Outcome: Progressing

## 2021-05-11 NOTE — Progress Notes (Signed)
Patient ID: Phillip Lynch, male   DOB: Oct 10, 1965, 55 y.o.   MRN: [id number] [facility] Progress Note   Assessment/ Plan:   1.  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  Seen by vascular surgery with plans for left arm AV fistula which was postponed to Monday secondary to influenza status with symptoms.  I have discontinued his furosemide today because of rising creatinine and clinical euvolemia on physical exam.  If labs continue to show rising creatinine on labs tomorrow/Monday, will ask vascular surgery to place a tunneled catheter along with permanent access with intention to start dialysis at this time. 2.  Influenza: He has completed his antiviral therapy with oseltamivir for influenza along with ongoing symptomatic management with guaifenesin/Tylenol and Tessalon Perles.  3.  Anemia: Likely secondary to chronic disease, ordered for intravenous iron for borderline iron stores and begin ESA. 4.  Anion gap metabolic acidosis: Monitor with volume replacement at this time. 5.  Hypertension: Blood pressures remain elevated-converted to carvedilol 6.25 mg twice daily today along with ongoing hydralazine and furosemide.  Furosemide dose decreased based on euvolemic assessment and rising creatinine.  Subjective:   Reports that he feels fantastic except for a mild residual cough that is nonproductive.  Objective:   BP (!) 156/99 (BP Location: Right Arm) Comment: Scheduled Coreg administered  Pulse 90   Temp 98.5 F (36.9 C) (Oral)   Resp 16   Ht 5\' 9"  (1.753 m)   Wt 55.8 kg   SpO2 99%   BMI 18.17 kg/m   Intake/Output Summary (Last 24 hours) at 05/11/2021 1007 Last data filed at 05/11/2021 0300 Gross per 24 hour  Intake 781.11 ml  Output --  Net 781.11 ml     Weight change: 4.4  kg  Physical Exam: Gen: Resting in bed, watching television CVS: Pulse regular rhythm, normal rate, S1 and S2 normal Resp: Clear to auscultation bilaterally, no rales/rhonchi Abd: Soft, flat, nontender, bowel sounds normal Ext: No lower extremity edema, left antecubital IV line in place.  No asterixis  Imaging: NM CARDIAC AMYLOID TUMOR LOC INFLAM SPECT 1 DAY  Result Date: 05/10/2021 CLINICAL DATA:  HEART FAILURE. CONCERN FOR CARDIAC AMYLOIDOSIS. EXAM: NUCLEAR MEDICINE TUMOR LOCALIZATION. PYP CARDIAC AMYLOIDOSIS SCAN WITH SPECT TECHNIQUE: Following intravenous administration of radiopharmaceutical, anterior planar images of the chest were obtained. Regions of interest were placed on the heart and contralateral chest wall for quantitative assessment. Additional SPECT imaging of the chest was obtained. RADIOPHARMACEUTICALS:  8 mCi TECHNETIUM 99 PYROPHOSPHATE FINDINGS: Planar Visual assessment: Anterior planar imaging demonstrates radiotracer uptake within the heart less than uptake within the adjacent ribs (Grade 1). Quantitative assessment : Quantitative assessment of the cardiac uptake compared to the contralateral chest wall is equal to 1.22 (H/CL = 1.22) . SPECT assessment: SPECT imaging of the chest demonstrates faint radiotracer accumulation within the LEFT ventricle. IMPRESSION: Visual and quantitative assessment (grade 1, H/CLL equal 1.22) are equivocally suggestive of transthyretin amyloidosis. Electronically Signed   By: Lavonia Dana M.D.   On: 05/10/2021 14:22    Labs: BMET Recent Labs  Lab 05/06/21 0953 05/06/21 1655 05/06/21 1700 05/07/21 0146 05/08/21 0240 05/09/21 0206 05/10/21 0140 05/11/21 0149  NA 136  --  136 136 138 137 136 136  K 5.5*  --  4.7 4.0 4.1 4.4 4.6 4.6  CL 107  --  110 104 107 106 105 106  CO2 16*  --  16* 21* 21* 22 22 21*  GLUCOSE 95  --  95 100* 102* 101* 103* 103*  BUN 60*  --  60* 64* 57* 63* 64* 73*  CREATININE 7.48* 6.76* 6.71* 6.80* 6.58* 7.08*  7.52* 7.97*  CALCIUM 9.4  --  7.8* 7.7* 7.7* 7.6* 7.6* 7.8*  PHOS  --  4.7*  --   --  3.8 4.3 4.0 4.4   CBC Recent Labs  Lab 05/08/21 0240 05/09/21 0206 05/10/21 0140 05/11/21 0149  WBC 3.3* 3.0* 3.5* 3.7*  NEUTROABS  --  1.5*  --   --   HGB 11.2* 10.5* 9.6* 8.9*  HCT 33.8* 32.5* 29.8* 28.2*  MCV 98.3 99.4 100.3* 101.4*  PLT 126* 127* 128* 148*    Medications:     carvedilol  6.25 mg Oral BID WC   darbepoetin (ARANESP) injection - NON-DIALYSIS  60 mcg Subcutaneous Q Sat-1800   feeding supplement (NEPRO CARB STEADY)  237 mL Oral BID BM   guaiFENesin  600 mg Oral BID   heparin  5,000 Units Subcutaneous Q8H   hydrALAZINE  25 mg Oral Q8H   isosorbide dinitrate  10 mg Oral TID   sodium bicarbonate  650 mg Oral BID   Elmarie Shiley, MD 05/11/2021, 10:07 AM

## 2021-05-12 DIAGNOSIS — N186 End stage renal disease: Secondary | ICD-10-CM

## 2021-05-12 LAB — CBC
HCT: 26.8 % — ABNORMAL LOW (ref 39.0–52.0)
Hemoglobin: 8.6 g/dL — ABNORMAL LOW (ref 13.0–17.0)
MCH: 32.6 pg (ref 26.0–34.0)
MCHC: 32.1 g/dL (ref 30.0–36.0)
MCV: 101.5 fL — ABNORMAL HIGH (ref 80.0–100.0)
Platelets: 145 10*3/uL — ABNORMAL LOW (ref 150–400)
RBC: 2.64 MIL/uL — ABNORMAL LOW (ref 4.22–5.81)
RDW: 13.7 % (ref 11.5–15.5)
WBC: 4 10*3/uL (ref 4.0–10.5)
nRBC: 0 % (ref 0.0–0.2)

## 2021-05-12 LAB — RENAL FUNCTION PANEL
Albumin: 2.9 g/dL — ABNORMAL LOW (ref 3.5–5.0)
Anion gap: 10 (ref 5–15)
BUN: 80 mg/dL — ABNORMAL HIGH (ref 6–20)
CO2: 20 mmol/L — ABNORMAL LOW (ref 22–32)
Calcium: 8.1 mg/dL — ABNORMAL LOW (ref 8.9–10.3)
Chloride: 106 mmol/L (ref 98–111)
Creatinine, Ser: 7.58 mg/dL — ABNORMAL HIGH (ref 0.61–1.24)
GFR, Estimated: 8 mL/min — ABNORMAL LOW (ref >60)
Glucose, Bld: 96 mg/dL (ref 70–99)
Phosphorus: 4.4 mg/dL (ref 2.5–4.6)
Potassium: 4.9 mmol/L (ref 3.5–5.1)
Sodium: 136 mmol/L (ref 135–145)

## 2021-05-12 LAB — GLUCOSE, CAPILLARY: Glucose-Capillary: 79 mg/dL (ref 70–99)

## 2021-05-12 MED ORDER — CARVEDILOL 12.5 MG PO TABS
12.5000 mg | ORAL_TABLET | Freq: Two times a day (BID) | ORAL | Status: DC
  Administered 2021-05-12 – 2021-05-15 (×7): 12.5 mg via ORAL
  Filled 2021-05-12 (×7): qty 1

## 2021-05-12 NOTE — Progress Notes (Signed)
HD#6 SUBJECTIVE:  Patient Summary: Phillip Lynch is a 55 y.o. with a pertinent PMH of HTN, unilateral renal agenesis, CKD V, who presented with influenza and admitted for influenza, acute on chronic kidney disease and new onset HFrEF.  Overnight Events: no acute events overnight  Interim History: This is hospital day 6 for Phillip Lynch who was seen and evaluated at the bedside this morning. Patient remains in good spirits. He still has persistent cough, although it is improving.    OBJECTIVE:  Vital Signs: Vitals:   05/11/21 0836 05/11/21 1918 05/12/21 0402 05/12/21 0446  BP: (!) 156/99 132/82  (!) 156/98  Pulse: 90 82    Resp:  17    Temp: 98.5 F (36.9 C) 98.3 F (36.8 C)    TempSrc: Oral Oral    SpO2: 99% 98%    Weight:   55.4 kg   Height:       Supplemental O2: Room Air SpO2: 98 %  Filed Weights   05/10/21 0500 05/11/21 0504 05/12/21 0402  Weight: 51.4 kg 55.8 kg 55.4 kg     Intake/Output Summary (Last 24 hours) at 05/12/2021 4132 Last data filed at 05/11/2021 1200 Gross per 24 hour  Intake 900 ml  Output 200 ml  Net 700 ml   Net IO Since Admission: 2,841.84 mL [05/12/21 0623]  Physical Exam: General: Pleasant, well-appearing male laying in bed.  CV: RRR. No murmurs, rubs, or gallops. Pulmonary: Lungs CTAB. Normal effort. Cough with deep inspiration. Abdominal: Soft, nontender, nondistended. Normal bowel sounds. Extremities: Palpable radial and DP pulses. Normal ROM. Skin: Warm and dry. No obvious rash or lesions. Neuro: A&Ox3. No focal deficit. 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 Cardiac amyloidosis is less likely given negative SPEP and PYP scan.  - HF team consulted, appreciate recs - Increased carvedilol 12.5 mg bid  - Continue hydral 25 mg tid + isordil 10 mg tid - Discontinued lasix yesterday   #AKI on CKD IV/V vs progression of CKD No emergent indications for dialysis at this time, however, the patient will likely need it in the future. Plan for AV fistula with vascular surgery on Monday, will hold off on tunneled HD cath. Patient had slight increase in Cr with addition of lasix to his medication regimen, and this has improved slightly since discontinuing it yesterday.  - Nephro consulted, appreciate recs - AV fistula 11/28, NPO at midnight tonight  - Monitor renal function panel   #Influenza A WBC count has normalized to 4 today. Continue supportive therapy with mucinex and tessalon as needed, although patient clinically is improving.    #Anemia  Possibly related to anemia of chronic disease. Hb 11.9 on  admission, and down to 8.6 this morning. No active signs of bleeding noted. Patient is s/p 2 IV iron infusions and started on aranesp injection by nephro.     Best Practice: Diet: Renal diet, NPO at midnight IVF: Fluids: none VTE: heparin injection 5,000 Units Start: 05/06/21 1400 Code: Full AB: None Therapy Recs: None, DME: none DISPO: Anticipated discharge in 2-3 days pending  procedure with vascular surgery and placement  .  Signature: Buddy Duty, D.O.  Internal Medicine Resident, PGY-1 [facility]  Pager: [phone] 6:23 AM, 05/12/2021   Please contact the on call pager after 5 pm and on weekends at [phone].

## 2021-05-12 NOTE — Progress Notes (Signed)
  Daily Progress Note  ESRD needing long term dialysis access  Subjective: No complaints this morning. smiling  Objective: Vitals:   05/12/21 0446 05/12/21 0743  BP: (!) 156/98 (!) 166/114  Pulse:  79  Resp:  19  Temp:  98.1 F (36.7 C)  SpO2:  100%    Physical Examination Palpable radial and ulnar pulses bilaterally Non-labored breathing Regular rate Cephalic and basilic vein visualized at the antecubital fossa  ASSESSMENT/PLAN:  Pt with ESRD needs long term dialysis access Plan for left-sided fistula tomorrow. Discussed with nephrology, will hold on tunneled HD cath at this time. NPO midnight.     Cassandria Santee MD MS [facility] [phone] 05/12/2021  8:27 AM

## 2021-05-12 NOTE — Progress Notes (Signed)
Patient ID: Phillip Lynch, male   DOB: 09/25/1965, 55 y.o.   MRN: [id number] [facility] Progress Note   Assessment/ Plan:   1.  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  Renal function slightly better overnight after discontinuing furosemide (yesterday determined that he was euvolemic on exam).  He is on schedule for tomorrow to undergo left upper arm AV fistula creation by vascular surgery following which he can be potentially discharged in 1 to 2 days after verification of stable labs with outpatient renal follow-up in 1 to 2 weeks.  I will arrange this and update details. 2.  Influenza: He has completed his antiviral therapy with oseltamivir for influenza along with ongoing symptomatic management with guaifenesin/Tylenol and Tessalon Perles.  3.  Anemia: Likely secondary to chronic disease, ordered for intravenous iron for borderline iron stores and begin ESA. 4.  Anion gap metabolic acidosis: Monitor with volume replacement at this time. 5.  Hypertension: Blood pressures remain elevated-converted to carvedilol 6.25 mg twice daily today along with ongoing hydralazine.  Furosemide discontinued yesterday with rising creatinine/euvolemic exam.  Subjective:   Denies any complaints and awaits information from CSW regarding housing/employment solutions to pursue after dialysis.  Objective:   BP (!) 147/89 (BP Location: Right Arm)   Pulse 85   Temp 98.1 F (36.7 C) (Oral)   Resp 19   Ht 5\' 9"  (1.753 m)   Wt 55.4 kg   SpO2 99%   BMI 18.04 kg/m   Intake/Output Summary (Last 24 hours) at 05/12/2021 2993 Last data filed at 05/11/2021 1200 Gross per 24 hour  Intake 500 ml  Output 200 ml  Net 300 ml     Weight change: -0.4 kg  Physical Exam: Gen: Resting in bed, watching television CVS:  Pulse regular rhythm, normal rate, S1 and S2 normal Resp: Clear to auscultation bilaterally, no rales/rhonchi Abd: Soft, flat, nontender, bowel sounds normal Ext: No lower extremity edema, no asterixis  Imaging: NM CARDIAC AMYLOID TUMOR LOC INFLAM SPECT 1 DAY  Result Date: 05/10/2021 CLINICAL DATA:  HEART FAILURE. CONCERN FOR CARDIAC AMYLOIDOSIS. EXAM: NUCLEAR MEDICINE TUMOR LOCALIZATION. PYP CARDIAC AMYLOIDOSIS SCAN WITH SPECT TECHNIQUE: Following intravenous administration of radiopharmaceutical, anterior planar images of the chest were obtained. Regions of interest were placed on the heart and contralateral chest wall for quantitative assessment. Additional SPECT imaging of the chest was obtained. RADIOPHARMACEUTICALS:  8 mCi TECHNETIUM 99 PYROPHOSPHATE FINDINGS: Planar Visual assessment: Anterior planar imaging demonstrates radiotracer uptake within the heart less than uptake within the adjacent ribs (Grade 1). Quantitative assessment : Quantitative assessment of the cardiac uptake compared to the contralateral chest wall is equal to 1.22 (H/CL = 1.22) . SPECT assessment: SPECT imaging of the chest demonstrates faint radiotracer accumulation within the LEFT ventricle. IMPRESSION: Visual and quantitative assessment (grade 1, H/CLL equal 1.22) are equivocally suggestive of transthyretin amyloidosis. Electronically Signed   By: Lavonia Dana M.D.   On: 05/10/2021 14:22    Labs: BMET Recent Labs  Lab 05/06/21 1655 05/06/21 1700 05/07/21 0146 05/08/21 0240 05/09/21 0206 05/10/21 0140 05/11/21 0149 05/12/21 0040  NA  --  136 136 138 137 136 136 136  K  --  4.7 4.0 4.1 4.4 4.6 4.6 4.9  CL  --  110 104 107 106 105 106 106  CO2  --  16* 21* 21* 22  22 21* 20*  GLUCOSE  --  95 100* 102* 101* 103* 103* 96  BUN  --  60* 64* 57* 63* 64* 73* 80*  CREATININE 6.76* 6.71* 6.80* 6.58* 7.08* 7.52* 7.97* 7.58*  CALCIUM  --  7.8* 7.7* 7.7* 7.6* 7.6* 7.8* 8.1*  PHOS 4.7*  --   --  3.8 4.3 4.0 4.4 4.4    CBC Recent Labs  Lab 05/09/21 0206 05/10/21 0140 05/11/21 0149 05/12/21 0040  WBC 3.0* 3.5* 3.7* 4.0  NEUTROABS 1.5*  --   --   --   HGB 10.5* 9.6* 8.9* 8.6*  HCT 32.5* 29.8* 28.2* 26.8*  MCV 99.4 100.3* 101.4* 101.5*  PLT 127* 128* 148* 145*    Medications:     carvedilol  12.5 mg Oral BID WC   darbepoetin (ARANESP) injection - NON-DIALYSIS  60 mcg Subcutaneous Q Sat-1800   feeding supplement (NEPRO CARB STEADY)  237 mL Oral BID BM   guaiFENesin  600 mg Oral BID   heparin  5,000 Units Subcutaneous Q8H   hydrALAZINE  25 mg Oral Q8H   isosorbide dinitrate  10 mg Oral TID   sodium bicarbonate  650 mg Oral BID   Elmarie Shiley, MD 05/12/2021, 9:25 AM

## 2021-05-13 ENCOUNTER — Encounter (HOSPITAL_COMMUNITY): Payer: Self-pay | Admitting: Internal Medicine

## 2021-05-13 ENCOUNTER — Encounter (HOSPITAL_COMMUNITY)
Admission: EM | Disposition: A | Discharge status: Home / Self Care | Payer: Self-pay | Source: Home / Self Care | Attending: Internal Medicine

## 2021-05-13 ENCOUNTER — Inpatient Hospital Stay (HOSPITAL_COMMUNITY): Payer: Medicaid Other | Admitting: Anesthesiology

## 2021-05-13 ENCOUNTER — Other Ambulatory Visit: Payer: Self-pay

## 2021-05-13 DIAGNOSIS — J101 Influenza due to other identified influenza virus with other respiratory manifestations: Secondary | ICD-10-CM

## 2021-05-13 DIAGNOSIS — I429 Cardiomyopathy, unspecified: Secondary | ICD-10-CM

## 2021-05-13 DIAGNOSIS — I5020 Unspecified systolic (congestive) heart failure: Secondary | ICD-10-CM

## 2021-05-13 DIAGNOSIS — N185 Chronic kidney disease, stage 5: Secondary | ICD-10-CM

## 2021-05-13 DIAGNOSIS — I132 Hypertensive heart and chronic kidney disease with heart failure and with stage 5 chronic kidney disease, or end stage renal disease: Secondary | ICD-10-CM

## 2021-05-13 HISTORY — PX: AV FISTULA PLACEMENT: SHX1204

## 2021-05-13 LAB — RENAL FUNCTION PANEL
Albumin: 3 g/dL — ABNORMAL LOW (ref 3.5–5.0)
Anion gap: 12 (ref 5–15)
BUN: 82 mg/dL — ABNORMAL HIGH (ref 6–20)
CO2: 16 mmol/L — ABNORMAL LOW (ref 22–32)
Calcium: 8.3 mg/dL — ABNORMAL LOW (ref 8.9–10.3)
Chloride: 108 mmol/L (ref 98–111)
Creatinine, Ser: 7.09 mg/dL — ABNORMAL HIGH (ref 0.61–1.24)
GFR, Estimated: 8 mL/min — ABNORMAL LOW (ref >60)
Glucose, Bld: 91 mg/dL (ref 70–99)
Phosphorus: 4 mg/dL (ref 2.5–4.6)
Potassium: 5.5 mmol/L — ABNORMAL HIGH (ref 3.5–5.1)
Sodium: 136 mmol/L (ref 135–145)

## 2021-05-13 LAB — SURGICAL PCR SCREEN
MRSA, PCR: NEGATIVE
Staphylococcus aureus: NEGATIVE

## 2021-05-13 LAB — CBC
HCT: 28.1 % — ABNORMAL LOW (ref 39.0–52.0)
Hemoglobin: 8.9 g/dL — ABNORMAL LOW (ref 13.0–17.0)
MCH: 32 pg (ref 26.0–34.0)
MCHC: 31.7 g/dL (ref 30.0–36.0)
MCV: 101.1 fL — ABNORMAL HIGH (ref 80.0–100.0)
Platelets: 159 10*3/uL (ref 150–400)
RBC: 2.78 MIL/uL — ABNORMAL LOW (ref 4.22–5.81)
RDW: 13.7 % (ref 11.5–15.5)
WBC: 5.8 10*3/uL (ref 4.0–10.5)
nRBC: 0 % (ref 0.0–0.2)

## 2021-05-13 LAB — UPEP/UIFE/LIGHT CHAINS/TP, 24-HR UR
% BETA, Urine: 13.4 %
ALPHA 1 URINE: 5.2 %
Albumin, U: 62.1 %
Alpha 2, Urine: 7.7 %
Free Kappa Lt Chains,Ur: 305.82 mg/L — ABNORMAL HIGH (ref 1.17–86.46)
Free Kappa/Lambda Ratio: 3.24 (ref 1.83–14.26)
Free Lambda Lt Chains,Ur: 94.41 mg/L — ABNORMAL HIGH (ref 0.27–15.21)
GAMMA GLOBULIN URINE: 11.6 %
Total Protein, Urine-Ur/day: 7838 mg/24 hr — ABNORMAL HIGH (ref 30–150)
Total Protein, Urine: 412.5 mg/dL
Total Volume: 1900

## 2021-05-13 LAB — GLUCOSE, CAPILLARY: Glucose-Capillary: 86 mg/dL (ref 70–99)

## 2021-05-13 LAB — IMMUNOFIXATION ELECTROPHORESIS
IgA: 400 mg/dL — ABNORMAL HIGH (ref 90–386)
IgG (Immunoglobin G), Serum: 1445 mg/dL (ref 603–1613)
IgM (Immunoglobulin M), Srm: 82 mg/dL (ref 20–172)
Total Protein ELP: 7.8 g/dL (ref 6.0–8.5)

## 2021-05-13 SURGERY — ARTERIOVENOUS (AV) FISTULA CREATION
Anesthesia: Regional | Site: Arm Upper | Laterality: Left

## 2021-05-13 MED ORDER — MIDAZOLAM HCL 2 MG/2ML IJ SOLN
INTRAMUSCULAR | Status: AC
  Filled 2021-05-13: qty 2

## 2021-05-13 MED ORDER — CHLORHEXIDINE GLUCONATE 0.12 % MT SOLN
OROMUCOSAL | Status: AC
  Administered 2021-05-13: 09:00:00 15 mL via OROMUCOSAL
  Filled 2021-05-13: qty 15

## 2021-05-13 MED ORDER — SODIUM CHLORIDE 0.9 % IV SOLN: INTRAVENOUS | Status: DC

## 2021-05-13 MED ORDER — FENTANYL CITRATE (PF) 100 MCG/2ML IJ SOLN
INTRAMUSCULAR | Status: DC | PRN
  Administered 2021-05-13 (×3): 25 ug via INTRAVENOUS

## 2021-05-13 MED ORDER — FENTANYL CITRATE (PF) 100 MCG/2ML IJ SOLN: 50.0000 ug | Freq: Once | INTRAMUSCULAR | Status: AC

## 2021-05-13 MED ORDER — FENTANYL CITRATE (PF) 100 MCG/2ML IJ SOLN
INTRAMUSCULAR | Status: AC
  Administered 2021-05-13: 09:00:00 50 ug via INTRAVENOUS
  Filled 2021-05-13: qty 2

## 2021-05-13 MED ORDER — CHLORHEXIDINE GLUCONATE 0.12 % MT SOLN: 15.0000 mL | Freq: Once | OROMUCOSAL | Status: AC

## 2021-05-13 MED ORDER — MEPIVACAINE HCL (PF) 1.5 % IJ SOLN
INTRAMUSCULAR | Status: DC | PRN
  Administered 2021-05-13: 15 mL via PERINEURAL

## 2021-05-13 MED ORDER — MIDAZOLAM HCL 5 MG/5ML IJ SOLN
INTRAMUSCULAR | Status: DC | PRN
  Administered 2021-05-13 (×2): 1 mg via INTRAVENOUS

## 2021-05-13 MED ORDER — SODIUM ZIRCONIUM CYCLOSILICATE 10 G PO PACK
10.0000 g | PACK | Freq: Every day | ORAL | Status: AC
  Administered 2021-05-13 – 2021-05-15 (×3): 10 g via ORAL
  Filled 2021-05-13 (×3): qty 1

## 2021-05-13 MED ORDER — HYDROMORPHONE HCL 2 MG PO TABS: 1.0000 mg | ORAL_TABLET | ORAL | Status: DC | PRN

## 2021-05-13 MED ORDER — HEPARIN 6000 UNIT IRRIGATION SOLUTION
Status: AC
  Filled 2021-05-13: qty 500

## 2021-05-13 MED ORDER — LIDOCAINE HCL 1 % IJ SOLN
INTRAMUSCULAR | Status: DC | PRN
  Administered 2021-05-13: 1 mL

## 2021-05-13 MED ORDER — CEFAZOLIN SODIUM-DEXTROSE 2-3 GM-%(50ML) IV SOLR
INTRAVENOUS | Status: DC | PRN
  Administered 2021-05-13: 2 g via INTRAVENOUS

## 2021-05-13 MED ORDER — PAPAVERINE HCL 30 MG/ML IJ SOLN
INTRAMUSCULAR | Status: AC
  Filled 2021-05-13: qty 2

## 2021-05-13 MED ORDER — LIDOCAINE HCL (PF) 1 % IJ SOLN
INTRAMUSCULAR | Status: AC
  Filled 2021-05-13: qty 30

## 2021-05-13 MED ORDER — SODIUM CHLORIDE 0.9 % IV SOLN: INTRAVENOUS | Status: DC | PRN

## 2021-05-13 MED ORDER — FENTANYL CITRATE (PF) 250 MCG/5ML IJ SOLN
INTRAMUSCULAR | Status: AC
  Filled 2021-05-13: qty 5

## 2021-05-13 MED ORDER — MIDAZOLAM HCL 2 MG/2ML IJ SOLN: 2.0000 mg | Freq: Once | INTRAMUSCULAR | Status: AC

## 2021-05-13 MED ORDER — KETAMINE HCL 50 MG/5ML IJ SOSY
PREFILLED_SYRINGE | INTRAMUSCULAR | Status: AC
  Filled 2021-05-13: qty 5

## 2021-05-13 MED ORDER — SODIUM BICARBONATE 650 MG PO TABS
1300.0000 mg | ORAL_TABLET | Freq: Two times a day (BID) | ORAL | Status: DC
  Administered 2021-05-13 – 2021-05-15 (×4): 1300 mg via ORAL
  Filled 2021-05-13 (×4): qty 2

## 2021-05-13 MED ORDER — MIDAZOLAM HCL 2 MG/2ML IJ SOLN
INTRAMUSCULAR | Status: AC
  Administered 2021-05-13: 09:00:00 2 mg via INTRAVENOUS
  Filled 2021-05-13: qty 2

## 2021-05-13 MED ORDER — HEPARIN SODIUM (PORCINE) 1000 UNIT/ML IJ SOLN
INTRAMUSCULAR | Status: DC | PRN
  Administered 2021-05-13: 2000 [IU] via INTRAVENOUS

## 2021-05-13 MED ORDER — ORAL CARE MOUTH RINSE: 15.0000 mL | Freq: Once | OROMUCOSAL | Status: AC

## 2021-05-13 MED ORDER — PROPOFOL 1000 MG/100ML IV EMUL
INTRAVENOUS | Status: AC
  Filled 2021-05-13: qty 200

## 2021-05-13 MED ORDER — CEFAZOLIN SODIUM-DEXTROSE 2-4 GM/100ML-% IV SOLN: 2.0000 g | Freq: Once | INTRAVENOUS | Status: DC

## 2021-05-13 MED ORDER — CEFAZOLIN SODIUM-DEXTROSE 2-4 GM/100ML-% IV SOLN
INTRAVENOUS | Status: AC
  Filled 2021-05-13: qty 100

## 2021-05-13 MED ORDER — HEPARIN 6000 UNIT IRRIGATION SOLUTION
Status: DC | PRN
  Administered 2021-05-13: 1

## 2021-05-13 MED ORDER — 0.9 % SODIUM CHLORIDE (POUR BTL) OPTIME
TOPICAL | Status: DC | PRN
  Administered 2021-05-13: 11:00:00 1000 mL

## 2021-05-13 SURGICAL SUPPLY — 44 items
ARMBAND PINK RESTRICT EXTREMIT (MISCELLANEOUS) ×2 IMPLANT
BAG COUNTER SPONGE SURGICOUNT (BAG) ×2 IMPLANT
BLADE CLIPPER SURG (BLADE) ×2 IMPLANT
CANISTER SUCT 3000ML PPV (MISCELLANEOUS) ×2 IMPLANT
CATH EMB 2FR 60CM (CATHETERS) ×2 IMPLANT
CATH EMB 3FR 40CM (CATHETERS) ×2 IMPLANT
CLIP VESOCCLUDE MED 6/CT (CLIP) ×2 IMPLANT
CLIP VESOCCLUDE SM WIDE 6/CT (CLIP) ×2 IMPLANT
COVER PROBE W GEL 5X96 (DRAPES) ×2 IMPLANT
DECANTER SPIKE VIAL GLASS SM (MISCELLANEOUS) ×2 IMPLANT
DERMABOND ADVANCED (GAUZE/BANDAGES/DRESSINGS) ×1
DERMABOND ADVANCED .7 DNX12 (GAUZE/BANDAGES/DRESSINGS) ×1 IMPLANT
ELECT REM PT RETURN 9FT ADLT (ELECTROSURGICAL) ×2
ELECTRODE REM PT RTRN 9FT ADLT (ELECTROSURGICAL) ×1 IMPLANT
GAUZE SPONGE 4X4 16PLY XRAY LF (GAUZE/BANDAGES/DRESSINGS) ×2 IMPLANT
GLOVE BIO SURGEON STRL SZ7 (GLOVE) ×2 IMPLANT
GLOVE OPTIFIT SS 7.5 STRL LX (GLOVE) ×2 IMPLANT
GLOVE SRG 8 PF TXTR STRL LF DI (GLOVE) ×3 IMPLANT
GLOVE SURG ENC MOIS LTX SZ6.5 (GLOVE) ×2 IMPLANT
GLOVE SURG POLYISO LF SZ8 (GLOVE) ×2 IMPLANT
GLOVE SURG PR MICRO ENCORE 7 (GLOVE) ×2 IMPLANT
GLOVE SURG UNDER POLY LF SZ7 (GLOVE) ×2 IMPLANT
GLOVE SURG UNDER POLY LF SZ8 (GLOVE) ×3
GOWN STRL REUS W/ TWL LRG LVL3 (GOWN DISPOSABLE) ×3 IMPLANT
GOWN STRL REUS W/TWL 2XL LVL3 (GOWN DISPOSABLE) ×2 IMPLANT
GOWN STRL REUS W/TWL LRG LVL3 (GOWN DISPOSABLE) ×3
HEMOSTAT SPONGE AVITENE ULTRA (HEMOSTASIS) IMPLANT
KIT BASIN OR (CUSTOM PROCEDURE TRAY) ×2 IMPLANT
KIT TURNOVER KIT B (KITS) ×2 IMPLANT
NS IRRIG 1000ML POUR BTL (IV SOLUTION) ×2 IMPLANT
PACK CV ACCESS (CUSTOM PROCEDURE TRAY) ×2 IMPLANT
PAD ARMBOARD 7.5X6 YLW CONV (MISCELLANEOUS) ×4 IMPLANT
SPONGE T-LAP 18X18 ~~LOC~~+RFID (SPONGE) ×2 IMPLANT
STOPCOCK 4 WAY LG BORE MALE ST (IV SETS) ×2 IMPLANT
SUT MNCRL AB 4-0 PS2 18 (SUTURE) ×2 IMPLANT
SUT PROLENE 6 0 BV (SUTURE) ×10 IMPLANT
SUT PROLENE 7 0 BV 1 (SUTURE) IMPLANT
SUT SILK 2 0 SH (SUTURE) ×2 IMPLANT
SUT SILK 3 0 SH CR/8 (SUTURE) ×2 IMPLANT
SUT VIC AB 3-0 SH 27 (SUTURE) ×1
SUT VIC AB 3-0 SH 27X BRD (SUTURE) ×1 IMPLANT
TOWEL GREEN STERILE (TOWEL DISPOSABLE) ×2 IMPLANT
UNDERPAD 30X36 HEAVY ABSORB (UNDERPADS AND DIAPERS) ×2 IMPLANT
WATER STERILE IRR 1000ML POUR (IV SOLUTION) ×2 IMPLANT

## 2021-05-13 NOTE — Anesthesia Postprocedure Evaluation (Signed)
Anesthesia Post Note  Patient: Phillip Lynch  Procedure(s) Performed: ARTERIOVENOUS (AV) FISTULA CREATION (Left: Arm Upper)     Patient location during evaluation: PACU Anesthesia Type: Regional Level of consciousness: awake and alert Pain management: pain level controlled Vital Signs Assessment: post-procedure vital signs reviewed and stable Respiratory status: spontaneous breathing, nonlabored ventilation and respiratory function stable Cardiovascular status: stable and blood pressure returned to baseline Anesthetic complications: no   No notable events documented.  Last Vitals:  Vitals:   05/13/21 1201 05/13/21 1217  BP: (!) 179/89 (!) 167/75  Pulse:  69  Resp: 13 11  Temp: 36.4 C   SpO2: 98% 98%    Last Pain:  Vitals:   05/13/21 1217  TempSrc:   PainSc: 0-No pain                 Audry Pili
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
## 2021-05-13 NOTE — Progress Notes (Signed)
Heart and Vascular Care Navigation  05/13/2021  Phillip Lynch 1965-10-10 [id number]  Reason for Referral: Outpatient Heart Failure CSW informed of pt housing situation by Inpatient [facility] CSW - consulted to complete VISPDAT assessment to possibly assist with temporary housing option for pt through [facility]- will follow pt in outpatient setting to help follow up on whatever options we are able to identify for pt   Engaged with patient face to face for initial visit for Heart and Vascular Care Coordination.                                                                                                   Assessment: CSW met with pt to complete VISDPAT but after further assessment discovered he already completed this with the [facility] and was supposed to be added to the waitlist for the [facility] program that just opened.  Unable to verify this as IRC is closed for the day but sent an email to inquire about pt status.  As voiced in the assessment completed with the inpatient [facility] social worker he is hesitant to stay in shelter but understands he might not have other options.  Has several "friends' but does not feel as if they would provide a healthy living situation with them as they engage with substance abuse- admits to former use while staying with them but hasn't used in a month per patient report- does not feel as if he has a problem and is not interested in going into a program to address addiction concerns.  Pt also has 2 siblings in [place]- does not think living with them would be a problem.  CSW encouraged pt to reach out to shelters provided by inpatient [facility] clinic and to discuss possible living situations with friends as we cannot guarantee ability to find options for pt with no source of income or insurance coverage.                                    HRT/VAS Care Coordination     Patients Home Cardiology Office Heart Failure Clinic   Outpatient Care Team Social  Worker   Social Worker Name: Tammy Sours- [facility]- [phone]   Living arrangements for the past 2 months No permanent address; Homeless   Lives with: Self   Patient Current Insurance Coverage Self-Pay   Patient Has Concern With Paying Medical Bills Yes   Does Patient Have Prescription Coverage? No   Home Assistive Devices/Equipment None       Social History:                                                                             SDOH Screenings   Alcohol Screen: Not  on file  Depression (PHQ2-9): Not on file  Financial Resource Strain: High Risk   Difficulty of Paying Living Expenses: Very hard  Food Insecurity: Food Insecurity Present   Worried About Running Out of Food in the Last Year: Often true   Ran Out of Food in the Last Year: Often true  Housing: High Risk   Last Housing Risk Score: 4  Physical Activity: Not on file  Social Connections: Not on file  Stress: Not on file  Tobacco Use: High Risk   Smoking Tobacco Use: Every Day   Smokeless Tobacco Use: Never   Passive Exposure: Not on file  Transportation Needs: Unmet Transportation Needs   Lack of Transportation (Medical): Yes   Lack of Transportation (Non-Medical): Yes    SDOH Interventions: Financial Resources:  Sales promotion account executive Interventions: Development worker, community (CAFA involved screened for Medicaid, pending) Inpatient CSW also referred to [facility] for help with disability application  Food Insecurity:  Food Insecurity Interventions: Intervention Not Indicated, Other (Comment) (Patient reported he has filled out a food stamp application and is awaiting to hear back)  Housing Insecurity:  Housing Interventions: Intervention Not Indicated, Other (Comment) (Resources provided and encouraged to reach out)  Transportation:   Transportation Interventions: [employer]    Follow-up plan:    Pt to call shelter list to discuss possible temporary housing options  CSW to follow up  with IRC to inquire about hotel program and advocate for admission from hospital.  Jorge Ny, [facility] Desk#: [phone] Cell#: [phone]

## 2021-05-13 NOTE — Progress Notes (Addendum)
[facility] NEPHROLOGY PROGRESS NOTE  Assessment/ Plan:  # Acute kidney injury on chronic kidney disease stage V: With acute component from volume contraction on progressive chronic kidney disease that is now likely at stage V (corroborated by significant renal atrophy on ultrasound).  He underwent left brachiocephalic fistula creation by Dr. Virl Cagey today.  He is nonoliguric and creatinine level seems to be stable.  No features of uremia and clinically looks stable.  If his labs looks stable in next 24 to 48 hours then I think he can be discharged with outpatient follow-up with [place].  No need for dialysis at this time.  #  Influenza: He has completed his antiviral therapy with oseltamivir for influenza along with ongoing symptomatic management with guaifenesin/Tylenol and Tessalon Perles.   #  Anemia: Likely secondary to chronic disease, ordered for intravenous iron for borderline iron stores and begin ESA.  #  Anion gap metabolic acidosis: Increase sodium bicarbonate dose.  Monitor CO2 level.  # Hypertension: Antihypertensive recently adjusted and he is back from the OR today.  Currently on carvedilol, hydralazine and Isordil.  Monitor BP.  Volume status acceptable therefore no need for diuretics.  #Hyperkalemia: Order Lokelma.  #CKD-MBD: Phosphorus level acceptable.  Calcium improving.  Subjective: Seen and examined at bedside.  He came back from OR after AV fistula creation.  He feels great without any colon cancer.  Denies headache, dizziness, nausea, vomiting, chest pain, shortness of breath.   Objective Vital signs in last 24 hours: Vitals:   05/13/21 0930 05/13/21 1201 05/13/21 1217 05/13/21 1342  BP: (!) 202/93 (!) 179/89 (!) 167/75 (!) 178/79  Pulse: 79  69 83  Resp: 16 13 11 18   Temp:  97.6 F (36.4 C)  (!) 97.5 F (36.4 C)  TempSrc:    Oral  SpO2: 100% 98% 98% 100%  Weight:      Height:       Weight change: -2.3 kg  Intake/Output Summary (Last 24 hours) at  05/13/2021 1501 Last data filed at 05/13/2021 1257 Gross per 24 hour  Intake 400 ml  Output 475 ml  Net -75 ml       Labs: Basic Metabolic Panel: Recent Labs  Lab 05/11/21 0149 05/12/21 0040 05/13/21 0237  NA 136 136 136  K 4.6 4.9 5.5*  CL 106 106 108  CO2 21* 20* 16*  GLUCOSE 103* 96 91  BUN 73* 80* 82*  CREATININE 7.97* 7.58* 7.09*  CALCIUM 7.8* 8.1* 8.3*  PHOS 4.4 4.4 4.0   Liver Function Tests: Recent Labs  Lab 05/06/21 1655 05/07/21 0146 05/08/21 0240 05/11/21 0149 05/12/21 0040 05/13/21 0237  AST 25 22  --   --   --   --   ALT 23 20  --   --   --   --   ALKPHOS 68 69  --   --   --   --   BILITOT 0.5 0.6  --   --   --   --   PROT 6.0* 6.1*  --   --   --   --   ALBUMIN 3.4* 3.2*   < > 3.1* 2.9* 3.0*   < > = values in this interval not displayed.   No results for input(s): LIPASE, AMYLASE in the last 168 hours. No results for input(s): AMMONIA in the last 168 hours. CBC: Recent Labs  Lab 05/09/21 0206 05/10/21 0140 05/11/21 0149 05/12/21 0040 05/13/21 0237  WBC 3.0* 3.5* 3.7* 4.0 5.8  NEUTROABS 1.5*  --   --   --   --   HGB 10.5* 9.6* 8.9* 8.6* 8.9*  HCT 32.5* 29.8* 28.2* 26.8* 28.1*  MCV 99.4 100.3* 101.4* 101.5* 101.1*  PLT 127* 128* 148* 145* 159   Cardiac Enzymes: No results for input(s): CKTOTAL, CKMB, CKMBINDEX, TROPONINI in the last 168 hours. CBG: Recent Labs  Lab 05/09/21 0731 05/10/21 0629 05/11/21 0911 05/12/21 0741 05/13/21 0740  GLUCAP 84 82 93 79 86    Iron Studies:  Recent Labs    05/11/21 0800  IRON 222*  TIBC 252  FERRITIN 454*   Studies/Results: No results found.  Medications: Infusions:  sodium chloride 10 mL/hr at 05/13/21 0907   ceFAZolin      Scheduled Medications:  carvedilol  12.5 mg Oral BID WC   darbepoetin (ARANESP) injection - NON-DIALYSIS  60 mcg Subcutaneous Q Sat-1800   feeding supplement (NEPRO CARB STEADY)  237 mL Oral BID BM   guaiFENesin  600 mg Oral BID   heparin  5,000 Units  Subcutaneous Q8H   hydrALAZINE  25 mg Oral Q8H   isosorbide dinitrate  10 mg Oral TID   sodium bicarbonate  650 mg Oral BID    have reviewed scheduled and prn medications.  Physical Exam: General:NAD, comfortable Heart:RRR, s1s2 nl Lungs:clear b/l, no crackle Abdomen:soft, Non-tender, non-distended Extremities:No edema Dialysis Access: Left AV fistula site is clean without bleeding.  Danniell Rotundo Tanna Furry 05/13/2021,3:01 PM  LOS: 6 days

## 2021-05-13 NOTE — Transfer of Care (Signed)
Immediate Anesthesia Transfer of Care Note  Patient: Phillip Lynch  Procedure(s) Performed: ARTERIOVENOUS (AV) FISTULA CREATION (Left: Arm Upper)  Patient Location: PACU  Anesthesia Type:MAC combined with regional for post-op pain  Level of Consciousness: awake, alert  and oriented  Airway & Oxygen Therapy: Patient Spontanous Breathing  Post-op Assessment: Report given to RN and Post -op Vital signs reviewed and stable  Post vital signs: Reviewed and stable  Last Vitals:  Vitals Value Taken Time  BP    Temp    Pulse    Resp 14 05/13/21 1203  SpO2    Vitals shown include unvalidated device data.  Last Pain:  Vitals:   05/13/21 0925  TempSrc:   PainSc: 0-No pain      Patients Stated Pain Goal: 0 (64/33/29 5188)  Complications: No notable events documented.

## 2021-05-13 NOTE — Progress Notes (Addendum)
Hospital day: 7  Subjective:  Phillip Lynch is a 55 y.o. male with a history of HTN, unilateral renal agenesis, CKD, who was admitted for influenza, acute on chronic kidney disease, and new-onset HFrEF.   Overnight event: None  Patient seen at bedside. He had an AV fistula placed in his left UE earlier this morning, reports some soreness from the procedure. He states his cough is still present but much improved. He denies abdominal pain, SOB, and chest pain.   Objective:  Vital signs in last 24 hours: Vitals:   05/13/21 0811 05/13/21 0853 05/13/21 0925 05/13/21 0930  BP: (!) 162/96 (!) 187/104 (!) 203/97 (!) 202/93  Pulse: 80 88 79 79  Resp: 18 18 16 16   Temp: 98.3 F (36.8 C) 97.6 F (36.4 C)    TempSrc: Oral Oral    SpO2: 99% 99% 96% 100%  Weight:      Height:        Filed Weights   05/11/21 0504 05/12/21 0402 05/13/21 0546  Weight: 55.8 kg 55.4 kg 53.1 kg     Intake/Output Summary (Last 24 hours) at 05/13/2021 1038 Last data filed at 05/12/2021 1200 Gross per 24 hour  Intake 200 ml  Output --  Net 200 ml   Net IO Since Admission: 3,621.84 mL [05/13/21 1334]  Recent Labs    05/11/21 0911 05/12/21 0741 05/13/21 0740  GLUCAP 93 79 86     Pertinent Labs: CBC Latest Ref Rng & Units 05/13/2021 05/12/2021 05/11/2021  WBC 4.0 - 10.5 K/uL 5.8 4.0 3.7(L)  Hemoglobin 13.0 - 17.0 g/dL 8.9(L) 8.6(L) 8.9(L)  Hematocrit 39.0 - 52.0 % 28.1(L) 26.8(L) 28.2(L)  Platelets 150 - 400 K/uL 159 145(L) 148(L)   BMP Latest Ref Rng & Units 05/13/2021 05/12/2021 05/11/2021  Glucose 70 - 99 mg/dL 91 96 103(H)  BUN 6 - 20 mg/dL 82(H) 80(H) 73(H)  Creatinine 0.61 - 1.24 mg/dL 7.09(H) 7.58(H) 7.97(H)  Sodium 135 - 145 mmol/L 136 136 136  Potassium 3.5 - 5.1 mmol/L 5.5(H) 4.9 4.6  Chloride 98 - 111 mmol/L 108 106 106  CO2 22 - 32 mmol/L 16(L) 20(L) 21(L)  Calcium 8.9 - 10.3 mg/dL 8.3(L) 8.1(L) 7.8(L)   Physical Exam  General: Alert, somewhat tired appearing male, in no  acute distress CV: RRR, no murmurs heard Pulmonary: No SOB, lungs clear anteriorly  Abdominal: Soft, nontender and nondistended Extremities: No LE edema. Left AV fistula present, covered with dermabond  Skin: Warm, dry  Neuro: Alert & oriented x3 Psych: Appropriate behavior and affect  Assessment/Plan: Principal Problem:   Influenza due to identified novel influenza A virus with other respiratory manifestations Active Problems:   Acute kidney injury superimposed on chronic kidney disease ([place])   Uncontrolled hypertension   Unintentional weight loss   Influenza A   HFrEF (heart failure with reduced ejection fraction) (HCC)   CKD (chronic kidney disease) stage V requiring chronic dialysis (HCC)   Chronic combined systolic and diastolic congestive heart failure (HCC)   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 Echo with LVEF 20-25%, global hypokinesis, mild LVH, and normal RV. Negative SPEP and PYP scan ruling out amyloidosis.  -HF team consulted, appreciate recs  -Continue carvedilol 12.5mg  BID, hydralazine 25mg  TID, isordil 10mg  TID -Outpatient cards f/u 06/18/21  #  AKI on CKD IV/V vs progression of CKD  Underwent AV fistula placement this morning, tolerated procedure well. Improvement in creatinine from 7.97>7.58>7.09 after stopping Lasix on 11/26. Will add oral dilaudid for pain management as needed since NSAIDs and renally-cleared opioids are contraindicated in ESRD.  -Nephrology consulted, appreciate recs  -Monitor renal function panel  -Dilaudid po PRN pain   #Anemia, likely 2/2 to CKD  Hemoglobin stable at 8.9. Started on ESA by nephrology.   #Influenza A, resolved  WBC wnl at 5.8.   #SDOH Patient currently  unhoused, had spent a few nights at a friend's house before this admission but will not be able to return there. We will reach out to [facility] and [facility] to assist with arranging shelter for him.   Diet: Renal IVF: None VTE: Heparin  CODE: Full  Prior to Admission Living Arrangement: Unhoused, living with friend Anticipated Discharge Location: TBD, will contact [facility] to arrange for shelter  Barriers to Discharge: Medical stability  Dispo: Anticipated discharge in 1-2 days pending clearance by vascular surgery following procedure   Signed: Stefani Dama, Medical Student 05/13/2021, 10:38 AM  Internal Medicine Teaching Service [phone] Please contact the on call pager after 5 pm and on weekends at [phone].

## 2021-05-13 NOTE — Progress Notes (Signed)
Pt arrived to unit, alert/drowsy in no apparent distress. AVF site , no dressing noted, dermabond.

## 2021-05-13 NOTE — Anesthesia Procedure Notes (Signed)
Anesthesia Regional Block: Supraclavicular block   Pre-Anesthetic Checklist: , timeout performed,  Correct Patient, Correct Site, Correct Laterality,  Correct Procedure, Correct Position, site marked,  Risks and benefits discussed,  Surgical consent,  Pre-op evaluation,  At surgeon's request and post-op pain management  Laterality: Left  Prep: chloraprep       Needles:  Injection technique: Single-shot  Needle Type: Echogenic Needle     Needle Length: 5cm  Needle Gauge: 21     Additional Needles:   Narrative:  Start time: 05/13/2021 9:31 AM End time: 05/13/2021 9:35 AM Injection made incrementally with aspirations every 5 mL.  Performed by: Personally  Anesthesiologist: Audry Pili, MD  Additional Notes: No pain on injection. No increased resistance to injection. Injection made in 5cc increments. Good needle visualization. Patient tolerated the procedure well.

## 2021-05-13 NOTE — Anesthesia Preprocedure Evaluation (Addendum)
Anesthesia Evaluation  Patient identified by MRN, date of birth, ID band Patient awake    Reviewed: Allergy & Precautions, NPO status , Patient's Chart, lab work & pertinent test results  History of Anesthesia Complications Negative for: history of anesthetic complications  Airway Mallampati: I  TM Distance: >3 FB Neck ROM: Full    Dental  (+) Dental Advisory Given   Pulmonary Current Smoker and Patient abstained from smoking.,    Pulmonary exam normal        Cardiovascular hypertension, Pt. on medications +CHF  Normal cardiovascular exam   '22 TTE - EF 20 to 25%. Global hypokinesis. The left ventricular internal cavity size was mildly dilated. Moderate left ventricular hypertrophy. Grade I diastolic dysfunction (impaired relaxation). Bright myocardium with  LVH and relatively preserved apical strain suggests the possibility of cardiac amyloidosis. Trivial MR, mild AI. There is mild dilatation of the ascending aorta, measuring 37 mm    Neuro/Psych negative neurological ROS  negative psych ROS   GI/Hepatic negative GI ROS, (+)     substance abuse  cocaine use,   Endo/Other   K 5.5   Renal/GU ESRFRenal disease Congenital unilateral renal agenesis      Musculoskeletal negative musculoskeletal ROS (+)   Abdominal   Peds  Hematology  (+) anemia ,   Anesthesia Other Findings   Reproductive/Obstetrics                            Anesthesia Physical Anesthesia Plan  ASA: 4  Anesthesia Plan: Regional   Post-op Pain Management: Regional block   Induction:   PONV Risk Score and Plan: 1 and Propofol infusion and Treatment may vary due to age or medical condition  Airway Management Planned: Natural Airway and Simple Face Mask  Additional Equipment: None  Intra-op Plan:   Post-operative Plan:   Informed Consent: I have reviewed the patients History and Physical, chart, labs and  discussed the procedure including the risks, benefits and alternatives for the proposed anesthesia with the patient or authorized representative who has indicated his/her understanding and acceptance.       Plan Discussed with: CRNA and Anesthesiologist  Anesthesia Plan Comments:        Anesthesia Quick Evaluation

## 2021-05-13 NOTE — Progress Notes (Signed)
  Daily Progress Note  ESRD needing long term dialysis access  Subjective: No complaints this morning. smiling  Objective: Vitals:   05/13/21 0925 05/13/21 0930  BP: (!) 203/97 (!) 202/93  Pulse: 79 79  Resp: 16 16  Temp:    SpO2: 96% 100%    Physical Examination Palpable radial and ulnar pulses bilaterally Non-labored breathing Regular rate Cephalic and basilic vein visualized at the antecubital fossa  ASSESSMENT/PLAN:  Pt with ESRD needs long term dialysis access Plan for left-sided fistula today. Discussed with nephrology, will hold on tunneled HD cath at this time.   Cassandria Santee MD MS [facility] [phone] 05/13/2021  10:27 AM

## 2021-05-13 NOTE — Discharge Instructions (Addendum)
Dear Mr. Mcclinton,   You were hospitalized for progression of your chronic kidney disease, the flu, and new onset heart failure.   Please take lokelma 10 g packet every day until your hospital follow up with Dr. Alfonse Spruce at 9:45 am on 12/7 at the [facility]. This is to keep your potassium levels low. Also, please take hydralazine 50 mg three times a day, isordil 10 mg three times a day, and carvediolol 12.5 mg twice a day for your blood pressure. You will require a hospital follow up appt with the kidney doctors, as well, so please call to make an appt for ONE week from now.  You can also take the mucinex and tessalon, as needed for your cough and congestion. You do not need to take these everyday, just as needed for your flu symptoms.   If you have any questions or concerns, call our clinic at [phone] or after hours call [phone] and ask for the internal medicine resident on call.    [facility]  Discharge Instructions  AV Fistula or Graft Surgery for Dialysis Access  Please refer to the following instructions for your post-procedure care. Your surgeon or physician assistant will discuss any changes with you.  Activity  You may drive the day following your surgery, if you are comfortable and no longer taking prescription pain medication. Resume full activity as the soreness in your incision resolves.  Bathing/Showering  You may shower after you go home. Keep your incision dry for 48 hours. Do not soak in a bathtub, hot tub, or swim until the incision heals completely. You may not shower if you have a hemodialysis catheter.  Incision Care  Clean your incision with mild soap and water after 48 hours. Pat the area dry with a clean towel. You do not need a bandage unless otherwise instructed. Do not apply any ointments or creams to your incision. You may have skin glue on your incision. Do not peel it off. It will come off on its own  in about one week. Your arm may swell a bit after surgery. To reduce swelling use pillows to elevate your arm so it is above your heart. Your doctor will tell you if you need to lightly wrap your arm with an ACE bandage.  Diet  Resume your normal diet. There are not special food restrictions following this procedure. In order to heal from your surgery, it is CRITICAL to get adequate nutrition. Your body requires vitamins, minerals, and protein. Vegetables are the best source of vitamins and minerals. Vegetables also provide the perfect balance of protein. Processed food has little nutritional value, so try to avoid this.  Medications  Resume taking all of your medications. If your incision is causing pain, you may take over-the counter pain relievers such as acetaminophen (Tylenol). If you were prescribed a stronger pain medication, please be aware these medications can cause nausea and constipation. Prevent nausea by taking the medication with a snack or meal. Avoid constipation by drinking plenty of fluids and eating foods with high amount of fiber, such as fruits, vegetables, and grains.  Do not take Tylenol if you are taking prescription pain medications.  Follow up Your surgeon may want to see you in the office following your access surgery. If so, this will be arranged at the time of your surgery.  Please call us immediately for any of the following conditions:  Increased pain, redness, drainage (pus) from your incision site Fever  of 101 degrees or higher Severe or worsening pain at your incision site Hand pain or numbness.  Reduce your risk of vascular disease:  Stop smoking. If you would like help, call [facility] at 1-800-QUIT-NOW 519-683-7832) or [place] at [place] your cholesterol Maintain a desired weight Control your diabetes Keep your blood pressure down  Dialysis  It will take several weeks to several months for your new dialysis access to be ready  for use. Your surgeon will determine when it is okay to use it. Your nephrologist will continue to direct your dialysis. You can continue to use your Permcath until your new access is ready for use.   05/13/2021 Ellijah Leffel [id number] 09-Dec-1965  Surgeon(s): Broadus John, MD  Procedure(s): Creation of left brachiocephalic AV fistula  X Do not stick left AVF for 12 weeks    If you have any questions, please call the office at [phone].

## 2021-05-13 NOTE — Op Note (Signed)
    NAME: Phillip Lynch    MRN: [id number] DOB: May 03, 1966    DATE OF OPERATION: 05/13/2021  PREOP DIAGNOSIS:    ESRD  POSTOP DIAGNOSIS:    ESRD  PROCEDURE:    Left brachiocephalic fistula  SURGEON: Broadus John  ASSIST: Paulo Fruit, PA  ANESTHESIA: Moderate, Block   EBL: 11ml  INDICATIONS:    Phillip Lynch is a 55 y.o. male with acute on chronic stage V kidney disease.  Vascular surgery was consulted for long-term hemodialysis access.  After discussing the risk and benefits of surgery, Phillip Lynch elected to proceed.  FINDINGS:   6 mm median antecubital vein 5 mm brachial artery  TECHNIQUE:   A left upper extremity block was performed by the anesthesia team. The patient was brought to the operating room and placed in supine position. The left arm was prepped and draped in standard fashion. IV antibiotics were administered. A timeout was performed.   The case began with ultrasound insonation of the brachial artery and cephalic vein, which demonstrated sufficient size at the antecubital fossa for arteriovenous fistula.   A transverse incision was made below the elbow creese in the antecubital fossa. The  median antecubital vein was isolated for 3 cm in length. Next the forearm aponeurosis was partially released and the brachial artery secured with a vessel loop. The patient was heparinized. The cephalic vein distal to the median antecubital vein was ligated. The median antecubital vein was transected and ligated medially with a 2-0 silk stick-tie. The vein was dilated with coronary dilators and flushed with heparin saline. Vascular clamps were placed proximally and distally on the brachial artery and a 5 mm arteriotomy was created on the brachial artery. This was flushed with heparin saline.  An anastomosis was created in end to side fashion on the brachial artery using running 6-0 Prolene suture.  Prior to completing the anastomosis, the vessels were flushed and the suture line  was tied down.  The fistula was pulsatile with an area of stenosis at the mid humerus.  A transverse venotomy was made on the fistula and a #2 Fogarty catheter was advanced to the axilla and pulled back. I moved to a #3 Fogarty catheter due to the size of the vein.  This yielded thrombus which was removed from the cephalic vein.  The transverse arteriotomy was closed using 6-0 Prolene suture.  On release of the vascular clamps, there was excellent filling of the cephalic vein into the axilla, with a thrill in the axilla.  The patient had a multiphasic radial and ulnar signal. He had an excellent doppler signal in the fistula. The incision was irrigated and hemostasis achieved with cautery and suture. The deeper tissue was closed with 3-0 Vicryl and the skin closed with 4-0 Monocryl.  Dermabond was applied to the incisions. The patient was transferred to PACU in stable condition.   Given the complexity of the case a first assistant was necessary in order to expedient the procedure and safely perform the technical aspects of the operation.  Macie Burows, MD [facility]  DATE OF DICTATION:   05/13/2021

## 2021-05-14 ENCOUNTER — Encounter (HOSPITAL_COMMUNITY): Payer: Self-pay | Admitting: Vascular Surgery

## 2021-05-14 LAB — MULTIPLE MYELOMA PANEL, SERUM
Albumin SerPl Elph-Mcnc: 3.1 g/dL (ref 2.9–4.4)
Albumin/Glob SerPl: 1.1 (ref 0.7–1.7)
Alpha 1: 0.2 g/dL (ref 0.0–0.4)
Alpha2 Glob SerPl Elph-Mcnc: 0.8 g/dL (ref 0.4–1.0)
B-Globulin SerPl Elph-Mcnc: 0.8 g/dL (ref 0.7–1.3)
Gamma Glob SerPl Elph-Mcnc: 1.1 g/dL (ref 0.4–1.8)
Globulin, Total: 2.9 g/dL (ref 2.2–3.9)
IgA: 316 mg/dL (ref 90–386)
IgG (Immunoglobin G), Serum: 1124 mg/dL (ref 603–1613)
IgM (Immunoglobulin M), Srm: 65 mg/dL (ref 20–172)
Total Protein ELP: 6 g/dL (ref 6.0–8.5)

## 2021-05-14 LAB — RENAL FUNCTION PANEL
Albumin: 3.1 g/dL — ABNORMAL LOW (ref 3.5–5.0)
Anion gap: 8 (ref 5–15)
BUN: 75 mg/dL — ABNORMAL HIGH (ref 6–20)
CO2: 19 mmol/L — ABNORMAL LOW (ref 22–32)
Calcium: 8.7 mg/dL — ABNORMAL LOW (ref 8.9–10.3)
Chloride: 112 mmol/L — ABNORMAL HIGH (ref 98–111)
Creatinine, Ser: 7.05 mg/dL — ABNORMAL HIGH (ref 0.61–1.24)
GFR, Estimated: 8 mL/min — ABNORMAL LOW (ref >60)
Glucose, Bld: 101 mg/dL — ABNORMAL HIGH (ref 70–99)
Phosphorus: 3.6 mg/dL (ref 2.5–4.6)
Potassium: 5.5 mmol/L — ABNORMAL HIGH (ref 3.5–5.1)
Sodium: 139 mmol/L (ref 135–145)

## 2021-05-14 LAB — CBC
HCT: 28 % — ABNORMAL LOW (ref 39.0–52.0)
Hemoglobin: 8.8 g/dL — ABNORMAL LOW (ref 13.0–17.0)
MCH: 31.9 pg (ref 26.0–34.0)
MCHC: 31.4 g/dL (ref 30.0–36.0)
MCV: 101.4 fL — ABNORMAL HIGH (ref 80.0–100.0)
Platelets: 178 10*3/uL (ref 150–400)
RBC: 2.76 MIL/uL — ABNORMAL LOW (ref 4.22–5.81)
RDW: 13.9 % (ref 11.5–15.5)
WBC: 6.7 10*3/uL (ref 4.0–10.5)
nRBC: 0 % (ref 0.0–0.2)

## 2021-05-14 LAB — GLUCOSE, CAPILLARY
Glucose-Capillary: 90 mg/dL (ref 70–99)
Glucose-Capillary: 89 mg/dL (ref 70–99)

## 2021-05-14 MED ORDER — HYDRALAZINE HCL 50 MG PO TABS
50.0000 mg | ORAL_TABLET | Freq: Three times a day (TID) | ORAL | Status: DC
  Administered 2021-05-14 – 2021-05-15 (×3): 50 mg via ORAL
  Filled 2021-05-14: qty 1

## 2021-05-14 NOTE — Progress Notes (Signed)
  Mobility Specialist Criteria Algorithm Info.  05/14/21 1535  Mobility  Activity Ambulated in hall  Range of Motion/Exercises Active;All extremities  Level of Assistance Independent  Assistive Device None  Distance Ambulated (ft) 2750 ft  Mobility Ambulated with assistance in hallway  Mobility Response Tolerated well  Mobility performed by Mobility specialist  Bed Position Semi-fowlers      Patient ambulated in hallway independently with steady gait. Tolerated ambulation well without complaint or incident. Was left lying supine in bed with all needs met.   05/14/2021 4:04 PM

## 2021-05-14 NOTE — Plan of Care (Signed)
  Problem: Education: Goal: Knowledge of General Education information will improve Description: Including pain rating scale, medication(s)/side effects and non-pharmacologic comfort measures Outcome: Progressing   Problem: Clinical Measurements: Goal: Will remain free from infection Outcome: Progressing   

## 2021-05-14 NOTE — TOC Progression Note (Addendum)
Transition of Care [facility]) - Progression Note    Patient Details  Name: Phillip Lynch MRN: [id number] Date of Birth: 01/03/1966  Transition of Care [facility]) CM/SW Contact  Jasmaine Rochel, [place] Phone Number: 05/14/2021, 9:46 AM  Clinical Narrative:    HF CSW spoke with Phillip Lynch this morning about the plan for discharge. Phillip Lynch reported that he is calling around to different shelters from the list the CSW provided. CSW will also follow back up with the [facility] to see about the waitlist or availability for Phillip Lynch. Phillip Lynch reported to be open to going to a shelter, inpatient treatment, or any available options for discharge. CSW and Phillip Lynch to see about discharge options. 9:52am - CSW reached out to the Lynch however nobody answered the phone and CSW had to leave a voicemail (asked for either a [place] or [place]). CSW spoke with [place] the outpatient HF CSW to see about any contacts with the [facility] as well.  10:04am - CSW called [facility] [phone] however nobody answered the phone and CSW had to leave a voicemail. 10:05am - CSW called [facility] [phone] and was transferred to speak with someone about their [facility]'s Recovery Program [phone] a 30-60 day program for men with substance use and to complete a phone screen for Phillip Lynch to call [phone]. CSW provided this information to Phillip Lynch to call for the phone screen and Phillip Lynch reported that he "doesn't know anything about [place] and may consider calling." 11:49am - HF CSW spoke with Phillip Lynch he reported that he did complete the phone screen with [facility] and that he would need to complete an interview in person between Monday-Thursday and would need a ride to get there. Phillip Lynch reported he is working his way through calling the shelter list to see which shelter has an opening.  12:08pm - HF CSW spoke with Phillip Lynch who reported he was able to get in touch with  Phillip Lynch at the [facility] and he gave her the CSW's phone number to coordinate a discharge plan and hopefully shelter. CSW is awaiting a call from [place]. Phillip Lynch provided the CSW with the [facility] contact information [phone] [facility] ext [phone] and Phillip Lynch as well as the student intern Phillip Lynch [phone]. CSW outreached Phillip Lynch [phone] ext [phone] and had to leave a voicemail for her to return the call. CSW [place] Con[place] [phone] and was able to speak with her who reported that [place] would be the best person to speak with as she is involved with the [employer] temporary shelter program at the [facility]. Phillip Lynch reported that she will be at the [facility] and will know more and for the CSW to call her tomorrow. 1:52pm - CSW spoke with Phillip Lynch to see if he had any luck with the shelter list and he reported that he has called but hasn't had much luck with finding shelter for discharge. Phillip Lynch reported that he called [phone] but that wasn't very helpful either. 2:00pm - CSW attending QC length of stay and was informed to call [facility] [phone] however they did not answer the phone and CSW left a voicemail for them to return the call. 3:13pm - CSW called the [facility] [phone] to see about bed availability however they did not pick up the phone and CSW had to leave a voicemail for them to return the call.  3:18pm - CSW attempted the call again to the [facility] and  was able to get in touch with admin. who reported that they are completely full at this time and to call the hotline number [phone] and leave the patients name and number and they will call once a bed becomes available. CSW left Mr. Henricks name and phone number for them to call. 3:47pm - HF CSW called [facility] in [place] however they did not answer the phone and CSW had to leave a voicemail for them to return the call. 3:53pm - CSW called a shelter in [place], [place]  however couldn't leave a voicemail as the mailbox was full.  CSW will continue to follow throughout discharge.   Expected Discharge Plan: Homeless Shelter Barriers to Discharge: Continued Medical Work up  Expected Discharge Plan and Services Expected Discharge Plan: Homeless Shelter In-house Referral: Clinical Social Work, Conservation officer, nature Services: CM Consult   Living arrangements for the past 2 months: No permanent address, Homeless                                       Social Determinants of Health (SDOH) Interventions Food Insecurity Interventions: Intervention Not Indicated, Other (Comment) (Patient reported he has filled out a food stamp application and is awaiting to hear back) Financial Strain Interventions: Development worker, community (CAFA involved screened for Medicaid, pending) Housing Interventions: Intervention Not Indicated, Other (Comment) (Resources provided and encouraged to reach out) Transportation Interventions: [employer]  Readmission Risk Interventions No flowsheet data found.  Braun Rocca, MSW, [place] Heart Failure Social Worker

## 2021-05-14 NOTE — Progress Notes (Signed)
  Postoperative hemodialysis access     Date of Surgery:  05/13/2021 Surgeon: Virl Cagey  Subjective:  says he has a little surgical pain at incision.  Denies any pain or numbness in his left hand.  PHYSICAL EXAMINATION:  Vitals:   05/13/21 1610 05/14/21 0500  BP: (!) 162/74 (!) 173/79  Pulse: 86 83  Resp: 18 18  Temp: 98.4 F (36.9 C) 98.6 F (37 C)  SpO2: 100% 97%    Incision is clean and dry Sensation in digits is intact;  There is  Thrill  The fistula is palpable  The radial pulse is palpable   ASSESSMENT/PLAN:  Phillip Lynch is a 55 y.o. year old male who is s/p left BC AVF.  - fistula is patent and easily ppalpable -pt does not have evidence of steal sx -f/u with [facility] in 6 weeks with duplex to check maturation of AVF.  Our office will arrange appointment. -will sign off-call as needed.   Leontine Locket, PA-C [facility] [phone]

## 2021-05-14 NOTE — Progress Notes (Signed)
Hospital day: 8  Subjective:  Phillip Lynch is a 55 y.o. male with PMH of unilateral renal agenesis, HTN, and CKD who was admitted for influenza A, progression of CKD, and newly diagnosed HFrEF.   Overnight event: No acute events.   Patient seen at bedside during morning rounds. He reports that he is feeling well. He has some soreness in his right arm from the IV and his left arm from his AVF. He has been calling various organizations for housing options.   Objective:  Vital signs in last 24 hours: Vitals:   05/13/21 1342 05/13/21 1610 05/14/21 0500 05/14/21 0758  BP: (!) 178/79 (!) 162/74 (!) 173/79 (!) 174/92  Pulse: 83 86 83 86  Resp: 18 18 18 20   Temp: (!) 97.5 F (36.4 C) 98.4 F (36.9 C) 98.6 F (37 C) 98.9 F (37.2 C)  TempSrc: Oral Oral Oral Oral  SpO2: 100% 100% 97% 99%  Weight:      Height:        Filed Weights   05/11/21 0504 05/12/21 0402 05/13/21 0546  Weight: 55.8 kg 55.4 kg 53.1 kg     Intake/Output Summary (Last 24 hours) at 05/14/2021 1131 Last data filed at 05/14/2021 0900 Gross per 24 hour  Intake 458.83 ml  Output 1625 ml  Net -1166.17 ml   Net IO Since Admission: 2,530.67 mL [05/14/21 1131]  Recent Labs    05/12/21 0741 05/13/21 0740 05/14/21 0757  GLUCAP 79 86 90     Pertinent Labs: CBC Latest Ref Rng & Units 05/14/2021 05/13/2021 05/12/2021  WBC 4.0 - 10.5 K/uL 6.7 5.8 4.0  Hemoglobin 13.0 - 17.0 g/dL 8.8(L) 8.9(L) 8.6(L)  Hematocrit 39.0 - 52.0 % 28.0(L) 28.1(L) 26.8(L)  Platelets 150 - 400 K/uL 178 159 145(L)   BMP Latest Ref Rng & Units 05/14/2021 05/13/2021 05/12/2021  Glucose 70 - 99 mg/dL 101(H) 91 96  BUN 6 - 20 mg/dL 75(H) 82(H) 80(H)  Creatinine 0.61 - 1.24 mg/dL 7.05(H) 7.09(H) 7.58(H)  Sodium 135 - 145 mmol/L 139 136 136  Potassium 3.5 - 5.1 mmol/L 5.5(H) 5.5(H) 4.9  Chloride 98 - 111 mmol/L 112(H) 108 106  CO2 22 - 32 mmol/L 19(L) 16(L) 20(L)  Calcium 8.9 - 10.3 mg/dL 8.7(L) 8.3(L) 8.1(L)    Physical  Exam  General: Pleasant, well appearing male in NAD  CV: RRR, murmurs heard  Pulmonary: Normal WOB, lungs CTAB. Deep inspiration provokes coughing  Abdominal: Soft, nontender and nondistended   Extremities: Mild swelling and tenderness near right antecubital region, well healing AVF on left antecubital region Skin: Warm, dry  Neuro: A&Ox3 Psych: Appropriate mood and behavior   Assessment/Plan: Phillip Lynch is a 55 y.o. male with hx of unilateral renal agenesis, HTN, and CKD presenting with influenza A, progression of CKD, and newly discovered HFrEF.   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 Nephrology increased hydralazine today, will continue other anti-hypertensive medications.  -HF team consulted, appreciate recs  -Increase hydralazine to 50mg  TID -Continue carvedilol 12.5mg  po BID, isosorbide 10mg  po TID  #Progression of CKD to stage V #Anemia, likely 2/2 CKD  Cr stable at 7.05. K+ elevated at 5.5,  started on Lokelma yesterday. Improving metabolic acidosis with bicarb of 19 up from 16. Oral sodium bicarb was increased yesterday. Hgb stable at 8.8. Vascular surgery with planned follow-up with [facility] in 6 weeks to check maturation of AVF. Outpatient nephrology follow-up with  Dr. Posey Pronto in 1-2 weeks.  -Nephrology consulted, appreciate recs  -Lokelma 10g po daily for hyperkalemia  -Sodium bicarb 1300mg  po BID  #SDOH  Discussed that we are working with TOC and IRC to find housing options. Patient states he does not want to be around "knuckleheads" that are at [facility] and wants to avoid being around people engaged in substance use. He prefers to stay in [place] as opposed to [place].  -f/u with TOC and IRC   Diet: Renal  IVF: None VTE: Heparin  CODE: Full  Prior to Admission Living Arrangement: Unhoused, staying with friend Anticipated Discharge Location: Shelter Barriers to Discharge: Pending shelter availability  Dispo: Anticipated discharge in 0-1 days   Signed: Stefani Dama, Medical Student 05/14/2021, 11:31 AM  Internal Medicine Teaching Service [phone] Please contact the on call pager after 5 pm and on weekends at [phone].

## 2021-05-14 NOTE — Progress Notes (Signed)
[facility] NEPHROLOGY PROGRESS NOTE  Assessment/ Plan:  # Acute kidney injury on chronic kidney disease stage V: With acute component from volume contraction on progressive chronic kidney disease that is now likely at stage V (corroborated by significant renal atrophy on ultrasound).  He underwent left brachiocephalic fistula creation by Dr. Virl Cagey on 11/28.  He is nonoliguric and creatinine level seems to be stable.  No features of uremia and clinically looks stable.  No need for dialysis at this time.  Hopefully he can wait until the fistula matures.  Ok to discharge from renal perspective and we will arrange outpatient follow-up with Dr. Posey Pronto in next 1 to 2 weeks.  Patient understands to come to ER if he develops any symptoms.  #  Influenza: He has completed his antiviral therapy with oseltamivir for influenza along with ongoing symptomatic management with guaifenesin/Tylenol and Tessalon Perles.   #  Anemia: Likely secondary to chronic disease, ordered for intravenous iron for borderline iron stores and begin ESA.  #  Anion gap metabolic acidosis: Increased sodium bicarbonate dose.  Monitor CO2 level.  # Hypertension: Blood pressure elevated.  I will increase hydralazine to 50 mg twice a day.  Continue carvedilol and Isordil.  Volume status acceptable therefore no need for diuretics.   #Hyperkalemia: Order Lokelma.  I discussed about low potassium diet with him.  I recommend to discharge the patient with [facility] and please provide few packets of Lokelma with him, maybe pharmacist can help to arrange it.  #CKD-MBD: Phosphorus level acceptable.  Calcium improving.  Sign off, please call back with question.  Subjective: Seen and examined at bedside.  He is feeling good.  Denies nausea, vomiting, chest pain, shortness of breath.  No uremic symptoms.  Objective Vital signs in last 24 hours: Vitals:   05/13/21 1342 05/13/21 1610 05/14/21 0500 05/14/21 0758  BP: (!) 178/79  (!) 162/74 (!) 173/79 (!) 174/92  Pulse: 83 86 83 86  Resp: 18 18 18 20   Temp: (!) 97.5 F (36.4 C) 98.4 F (36.9 C) 98.6 F (37 C) 98.9 F (37.2 C)  TempSrc: Oral Oral Oral Oral  SpO2: 100% 100% 97% 99%  Weight:      Height:       Weight change:   Intake/Output Summary (Last 24 hours) at 05/14/2021 1240 Last data filed at 05/14/2021 0900 Gross per 24 hour  Intake 58.83 ml  Output 1600 ml  Net -1541.17 ml        Labs: Basic Metabolic Panel: Recent Labs  Lab 05/12/21 0040 05/13/21 0237 05/14/21 0241  NA 136 136 139  K 4.9 5.5* 5.5*  CL 106 108 112*  CO2 20* 16* 19*  GLUCOSE 96 91 101*  BUN 80* 82* 75*  CREATININE 7.58* 7.09* 7.05*  CALCIUM 8.1* 8.3* 8.7*  PHOS 4.4 4.0 3.6    Liver Function Tests: Recent Labs  Lab 05/12/21 0040 05/13/21 0237 05/14/21 0241  ALBUMIN 2.9* 3.0* 3.1*    No results for input(s): LIPASE, AMYLASE in the last 168 hours. No results for input(s): AMMONIA in the last 168 hours. CBC: Recent Labs  Lab 05/09/21 0206 05/10/21 0140 05/11/21 0149 05/12/21 0040 05/13/21 0237 05/14/21 0241  WBC 3.0* 3.5* 3.7* 4.0 5.8 6.7  NEUTROABS 1.5*  --   --   --   --   --   HGB 10.5* 9.6* 8.9* 8.6* 8.9* 8.8*  HCT 32.5* 29.8* 28.2* 26.8* 28.1* 28.0*  MCV 99.4 100.3* 101.4* 101.5* 101.1* 101.4*  PLT  127* 128* 148* 145* 159 178    Cardiac Enzymes: No results for input(s): CKTOTAL, CKMB, CKMBINDEX, TROPONINI in the last 168 hours. CBG: Recent Labs  Lab 05/10/21 0629 05/11/21 0911 05/12/21 0741 05/13/21 0740 05/14/21 0757  GLUCAP 82 93 79 86 90     Iron Studies:  No results for input(s): IRON, TIBC, TRANSFERRIN, FERRITIN in the last 72 hours.  Studies/Results: No results found.  Medications: Infusions:  sodium chloride 10 mL/hr at 05/13/21 0907    Scheduled Medications:  carvedilol  12.5 mg Oral BID WC   darbepoetin (ARANESP) injection - NON-DIALYSIS  60 mcg Subcutaneous Q Sat-1800   feeding supplement (NEPRO CARB STEADY)   237 mL Oral BID BM   guaiFENesin  600 mg Oral BID   heparin  5,000 Units Subcutaneous Q8H   hydrALAZINE  50 mg Oral Q8H   isosorbide dinitrate  10 mg Oral TID   sodium bicarbonate  1,300 mg Oral BID   sodium zirconium cyclosilicate  10 g Oral Daily    have reviewed scheduled and prn medications.  Physical Exam: General: Lying on bed comfortable, not in distress Heart:RRR, s1s2 nl Lungs: Clear b/l, no crackle Abdomen:soft, Non-tender, non-distended Extremities:No leg edema Dialysis Access: Left AV fistula site is clean without bleeding.  Mallary Kreger Prasad Brentlee Delage 05/14/2021,12:40 PM  LOS: 7 days

## 2021-05-14 NOTE — TOC Progression Note (Addendum)
Transition of Care [facility]) - Progression Note    Patient Details  Name: Phillip Lynch MRN: [id number] Date of Birth: 06/01/1966  Transition of Care [facility]) CM/SW [place], [place] Phone Number: 05/14/2021, 5:28 PM  Clinical Narrative:    HF CSW received a call from teaching service MD reporting that [name] will have a hotel room with the [facility] and needs to be discharged with a ride tomorrow morning before 10am to the [facility]. The attending MD was able to speak with the director of the [facility], Rayfield Citizen to arrange for a hotel room for [name]. CSW will arrange transport tomorrow morning first thing to get [name] over to the [facility] before 10am.   5:26pm- CSW spoke with [name] to let him know about the plan for tomorrow and the hotel room with the [facility] and transportation will be arranged tomorrow morning first thing.  CSW will continue to follow throughout discharge.   Expected Discharge Plan: Homeless Shelter Barriers to Discharge: Continued Medical Work up  Expected Discharge Plan and Services Expected Discharge Plan: Homeless Shelter In-house Referral: Clinical Social Work, Conservation officer, nature Services: CM Consult   Living arrangements for the past 2 months: No permanent address, Homeless                                       Social Determinants of Health (SDOH) Interventions Food Insecurity Interventions: Intervention Not Indicated, Other (Comment) (Patient reported he has filled out a food stamp application and is awaiting to hear back) Financial Strain Interventions: Development worker, community (CAFA involved screened for Medicaid, pending) Housing Interventions: Intervention Not Indicated, Other (Comment) (Resources provided and encouraged to reach out) Transportation Interventions: [employer]  Readmission Risk Interventions No flowsheet data found.  Rc Amison, MSW, [place] Heart Failure Social Worker

## 2021-05-15 ENCOUNTER — Encounter: Payer: Self-pay | Admitting: *Deleted

## 2021-05-15 ENCOUNTER — Other Ambulatory Visit (HOSPITAL_COMMUNITY): Payer: Self-pay

## 2021-05-15 LAB — CBC
HCT: 27.9 % — ABNORMAL LOW (ref 39.0–52.0)
Hemoglobin: 8.9 g/dL — ABNORMAL LOW (ref 13.0–17.0)
MCH: 32.5 pg (ref 26.0–34.0)
MCHC: 31.9 g/dL (ref 30.0–36.0)
MCV: 101.8 fL — ABNORMAL HIGH (ref 80.0–100.0)
Platelets: 186 10*3/uL (ref 150–400)
RBC: 2.74 MIL/uL — ABNORMAL LOW (ref 4.22–5.81)
RDW: 13.8 % (ref 11.5–15.5)
WBC: 6.8 10*3/uL (ref 4.0–10.5)
nRBC: 0 % (ref 0.0–0.2)

## 2021-05-15 LAB — RENAL FUNCTION PANEL
Albumin: 3 g/dL — ABNORMAL LOW (ref 3.5–5.0)
Anion gap: 7 (ref 5–15)
BUN: 79 mg/dL — ABNORMAL HIGH (ref 6–20)
CO2: 19 mmol/L — ABNORMAL LOW (ref 22–32)
Calcium: 8.4 mg/dL — ABNORMAL LOW (ref 8.9–10.3)
Chloride: 109 mmol/L (ref 98–111)
Creatinine, Ser: 7.05 mg/dL — ABNORMAL HIGH (ref 0.61–1.24)
GFR, Estimated: 8 mL/min — ABNORMAL LOW (ref >60)
Glucose, Bld: 101 mg/dL — ABNORMAL HIGH (ref 70–99)
Phosphorus: 3 mg/dL (ref 2.5–4.6)
Potassium: 5.8 mmol/L — ABNORMAL HIGH (ref 3.5–5.1)
Sodium: 135 mmol/L (ref 135–145)

## 2021-05-15 MED ORDER — BENZONATATE 200 MG PO CAPS
200.0000 mg | ORAL_CAPSULE | Freq: Two times a day (BID) | ORAL | 0 refills | Status: AC | PRN
  Filled 2021-05-15: qty 14, 7d supply, fill #0

## 2021-05-15 MED ORDER — ISOSORBIDE DINITRATE 10 MG PO TABS
10.0000 mg | ORAL_TABLET | Freq: Three times a day (TID) | ORAL | 0 refills | Status: DC
  Filled 2021-05-15: qty 90, 30d supply, fill #0

## 2021-05-15 MED ORDER — SODIUM ZIRCONIUM CYCLOSILICATE 10 G PO PACK
10.0000 g | PACK | Freq: Every day | ORAL | 0 refills | Status: AC
Start: 2021-05-15 — End: 2021-06-14
  Filled 2021-05-15: qty 30, 30d supply, fill #0

## 2021-05-15 MED ORDER — HYDRALAZINE HCL 50 MG PO TABS
50.0000 mg | ORAL_TABLET | Freq: Three times a day (TID) | ORAL | 0 refills | Status: DC
  Filled 2021-05-15: qty 90, 30d supply, fill #0

## 2021-05-15 MED ORDER — AMLODIPINE BESYLATE 10 MG PO TABS
10.0000 mg | ORAL_TABLET | Freq: Every day | ORAL | 0 refills | Status: DC
  Filled 2021-05-15: qty 30, 30d supply, fill #0

## 2021-05-15 MED ORDER — SODIUM BICARBONATE 650 MG PO TABS
1300.0000 mg | ORAL_TABLET | Freq: Two times a day (BID) | ORAL | 0 refills | Status: AC
  Filled 2021-05-15: qty 30, 8d supply, fill #0

## 2021-05-15 MED ORDER — CARVEDILOL 12.5 MG PO TABS
12.5000 mg | ORAL_TABLET | Freq: Two times a day (BID) | ORAL | 0 refills | Status: DC
  Filled 2021-05-15: qty 60, 30d supply, fill #0

## 2021-05-15 MED ORDER — SODIUM ZIRCONIUM CYCLOSILICATE 10 G PO PACK
10.0000 g | PACK | Freq: Every day | ORAL | Status: DC
  Administered 2021-05-15: 10 g via ORAL
  Filled 2021-05-15: qty 1

## 2021-05-15 MED ORDER — GUAIFENESIN ER 600 MG PO TB12
600.0000 mg | ORAL_TABLET | Freq: Two times a day (BID) | ORAL | 0 refills | Status: AC
Start: 2021-05-15 — End: 2021-05-22
  Filled 2021-05-15: qty 14, 7d supply, fill #0

## 2021-05-15 NOTE — Discharge Summary (Signed)
Name: Beniah Magnan MRN: [id number] DOB: February 04, 1966 55 y.o. PCP: Patient, [name] (Inactive)  Date of Admission: 05/06/2021  9:49 AM Date of Discharge:  05/15/2021 Attending Physician: Dr. Philipp Ovens  DISCHARGE DIAGNOSIS:  Primary Problem: Influenza due to identified novel influenza A virus with other respiratory manifestations   Hospital Problems: Principal Problem:   Influenza due to identified novel influenza A virus with other respiratory manifestations Active Problems:   Acute kidney injury superimposed on chronic kidney disease ([place])   Uncontrolled hypertension   Unintentional weight loss   Influenza A   HFrEF (heart failure with reduced ejection fraction) (HCC)   CKD (chronic kidney disease) stage V requiring chronic dialysis (HCC)   Chronic combined systolic and diastolic congestive heart failure (HCC)   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   benzonatate 200 MG capsule Commonly known as: TESSALON Take 1 capsule (200 mg total) by mouth 2 (two) times daily as needed for up to 7 days for cough.   carvedilol 12.5 MG tablet Commonly known as: COREG Take 1 tablet (12.5 mg total) by mouth 2 (two) times daily with a meal.   ferrous sulfate 325 (65 FE) MG tablet Take 1 tablet (325 mg total) by mouth daily with breakfast.   guaiFENesin 600 MG 12 hr tablet Commonly known as: MUCINEX Take 1 tablet (600 mg total) by  mouth 2 (two) times daily for 7 days.   hydrALAZINE 50 MG tablet Commonly known as: APRESOLINE Take 1 tablet (50 mg total) by mouth every 8 (eight) hours. What changed:  medication strength how much to take when to take this   isosorbide dinitrate 10 MG tablet Commonly known as: ISORDIL Take 1 tablet (10 mg total) by mouth 3 (three) times daily.   sodium bicarbonate 650 MG tablet Take 2 tablets (1,300 mg total) by mouth 2 (two) times daily.   sodium zirconium cyclosilicate 10 g Pack packet Commonly known as: LOKELMA Take 1 packet (10 g) by mouth daily.        DISPOSITION AND FOLLOW-UP:  Mr.Nikko Doucette was discharged from [facility] in Stable condition. At the hospital follow up visit please address:  Follow-up Recommendations: Consults: Nephrology, Cardiology Labs: Basic Metabolic Profile and CBC (Follow up Cr, potassium, and Hb) Studies: None Medications: START carvedilol 12.5 mg bid, hydralazine 50 mg q8h, isordil 10 mg tid all for blood pressure control Lokelma 10 g packet daily - will need K recheck Continue amlodipine 10 mg daily  STOP metoprolol  Follow-up Appointments:  Follow-up [facility] Follow up on 05/30/2021.   Why: Post hospital follow up scheduled for 05/30/2021 at 10:30am with Dr. Asencion Noble Contact information: [place] [id number] [phone]  Tolia, Sunit, DO Follow up on 06/18/2021.   Specialties: Cardiology, Vascular Surgery Why: @245pm  Heart failure management Contact information: [place] [address] [phone]         [facility] Follow up.   Why: 4-6 weeks. The office will call the patient with an appointment(sent) Contact information: [address] [place]        Elmarie Shiley, MD. Schedule an appointment as soon as possible  for a visit in 1 week(s).   Specialty: Nephrology Contact information: [place] [address] [phone]         Gaylan Gerold, DO. Go on 05/22/2021.   Specialty: Internal Medicine Why: 9:45 appt- hospital follow up Contact information: [place] [address] [phone]                 HOSPITAL COURSE:  Patient Summary: Patient Summary  Faheem Ziemann is a 55 year old male with a history of hypertension, congenital unilateral renal agenesis, and CKD who presented to the ED on 11/21 with acute onset URI symptoms admitted for influenza A, progression of CKD, and new onset HFrEF.   ED Course Patient presented with 3 days of URI symptoms and BP of 183/135. RPP positive for influenza A. CMP notable for hyperkalemia of 5.5, Cr 7.48, BUN 60, GFR 8, and metabolic acidosis with CO2 of 16. UA with microscopic hematuria, proteinuria, few bacteria. HIV and RPR testing added at patient request. Lactic acid wnl. EKG showed biatrial enlargement and RVH. CXR with cardiomegaly, no acute consolidations. Patient given Ceftriaxone, Lokelma x1, fluid bolus, and labetalol. Blood and urine cultures were obtained and nephrology was consulted.   #Influenza A Patient was started on Tamiflu 30mg  renally dosed qod x 5 days, as well as symptomatic care with Tylenol, Guaifenesin, and Tessalon Perles. Completed Tamiflu course and symptomatically improved, although cough lingered.   #HTN  Patient was started on Amlodipine 10mg  po daily and Metoprolol 12.5mg  po BID for elevated BP. Patient was switched to Hydralazine 50mg  TID and started on Lasix per cardiology recs, although lasix was discontinued in the setting of worsening kidney function. Metoprolol was also transitioned to carvedilol. At time of discharge, BP was stable, although still hypertensive.  Patient was continued on carvedilol 12.5 mg bid, hydralazine 50 mg tid, and isordil 10 mg tid. Will have follow up with the [facility] in 1  week, and recommended to follow up with nephrology in 1 week as well.   #Acute on chronic kidney disease  #Solitary kidney  #Microscopic hematuria  #Hyperkalemia, resolved  #Metabolic acidosis, resolved Renal ultrasound with significant atrophy compared to prior study in Feb 2021. Likely progression of CKD to stage V. Repeat BMP showed K+ wnl, CO2 16>21, Cr 6.8, GFR 9, BUN 64. No urgent indications for HD but likely approaching ESRD and will need HD in the next few months. Nephrology arranged for vein mapping and vascular was consulted for AV fistula placement. Rechecked ANA which was positive, with RNP still positive, as well. Kappa/lambda free light chains elevated, with ratio slightly elevated at 1.79. SPEP negative. AV fistula was placed on 11/28. K+ elevated to 5.8 at time of discharge, patient given a 10 day supply of Lokelma and instructed to take daily. Patient recommended to make a follow up appointment with Dr. Posey Pronto, nephrology, in 1 week for a hospital follow up and to recheck kidney function/electrolytes.   #New HFrEF likely secondary to HTN  cardiomyopathy Echo was obtained to evaluate for pulmonary artery hypertension and structure heart disease based on abnormal EKG and prior autoimmune panel with +ANA/+u1 RNP. Echo revealed EF 20-25%, moderate LVH, and grade I diastolic dysfunction. Bright myocardium with LVH and relatively preserved apical strain suggests the possibility of  cardiac amyloidosis. PYP scan to evaluate for amyloidosis was negative, thus ruling out cardiac amyloidosis. Myeloma workup also negative, making amyloidosis unlikely, as well. Patient started on hydralazine 25 mg tid, isordil 10 mg tid, carvedilol 12.5 mg bid, and lasix 20 mg daily. Lasix was discontinued due to rising creatinine/euvolemic exam and creatinine improved, and remained stable after discontinuation of lasix. Will require follow up with cardiology in the future, as well.  #Macrocytic anemia  Initial Hgb  of 11.9 with MCV 103. Folate and B12 wnl. Iron 222, 88% saturation. 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 Received 2 IV iron infusions and started on Aranesp injection by nephrology.   #Unintentional weight loss  Patient with 30lb weight loss in past 6 months. HIV negative, TSH wnl. Patient denies dysphagia, abdominal pain, hematuria, melena, chronic constipation, and constitutional symptoms. No family history of cancer. Has never had a colonoscopy. Weight loss likely due to CKD and HFrEF. We have arranged PCP follow-up with [facility] on 05/22/21 with Dr. Alfonse Spruce.   #SDOH  Patient currently uninsured, lacking transportation, and facing housing insecurity since early summer. Case management was consulted during his stay and referral made for Medicaid evaluation. Patient had been in contact with the [facility] and was discharged to [facility] with a hotel room through the [facility]:   Discharge Instructions     ([place]) Call MD:  Anytime you have any of the following symptoms: 1) 3 pound weight gain in 24 hours or 5 pounds in 1 week 2) shortness of breath, with or without a dry hacking cough 3) swelling in the hands, feet or stomach 4) if you have to sleep on extra pillows at night in order to breathe.   Complete by: As directed    Call MD for:  redness, tenderness, or signs of infection (pain, swelling, redness, odor or green/yellow discharge around incision site)   Complete by: As directed    Call MD for:  temperature >100.4   Complete by: As directed    Diet - low sodium heart healthy   Complete by: As directed    Increase activity slowly   Complete by: As directed    No wound care   Complete by: As directed        Dear Mr. Spang,   You were hospitalized for progression of your chronic kidney disease, the flu, and new onset heart failure.   Please take lokelma 10 g packet every day until your hospital follow up with Dr. Alfonse Spruce at 9:45 am on 12/7 at the [facility]. This is to keep your potassium levels low. Also, please take hydralazine 50 mg three times a day, isordil 10 mg three times a day, and carvediolol 12.5 mg twice a day for your blood pressure. You will require a hospital follow up appt with the kidney doctors, as well, so please call to make an appt for ONE week from now.  You can also take the mucinex and tessalon, as needed for your cough and congestion. You do not need to take these everyday, just as needed for your flu symptoms.   If you have any questions or concerns, call our clinic at [phone] or  after hours call [phone] and ask for the internal medicine resident on call.  SUBJECTIVE:  Refoel Palladino was seen and evaluated on the day of discharge. He feels well overall and is eager to be discharged. Is having some pain in his arm, at the site of his IV, but notes this is stable and well controlled.    Discharge Vitals:   BP (!) 165/76 (BP Location: Right Leg)   Pulse 88   Temp 98.3 F (36.8 C) (Oral)   Resp 14   Ht 5\' 9"  (1.753 m)   Wt 53.1 kg   SpO2 99%   BMI 17.29 kg/m   OBJECTIVE:  General: Pleasant, well-appearing male laying in bed. No acute distress. CV: RRR. No murmurs, rubs, or gallops. No LE edema Pulmonary: Lungs CTAB. Normal effort. Abdominal: Soft, nontender, nondistended. Normal bowel sounds. Extremities: Palpable radial and DP pulses. S/p L AV fistula Skin: Warm and dry. No obvious rash or lesions. Neuro: A&Ox3. No focal deficit. Psych: Normal mood and affect    Pertinent Labs, Studies, and Procedures:  CBC Latest Ref Rng & Units 05/15/2021 05/14/2021 05/13/2021  WBC 4.0 - 10.5 K/uL 6.8 6.7 5.8  Hemoglobin 13.0 - 17.0 g/dL 8.9(L) 8.8(L) 8.9(L)  Hematocrit 39.0 - 52.0 % 27.9(L) 28.0(L) 28.1(L)  Platelets 150 - 400 K/uL 186 178 159    CMP Latest Ref Rng & Units 05/15/2021 05/14/2021 05/13/2021  Glucose 70 - 99 mg/dL 101(H) 101(H) 91  BUN 6 - 20 mg/dL 79(H) 75(H) 82(H)  Creatinine 0.61 - 1.24  mg/dL 7.05(H) 7.05(H) 7.09(H)  Sodium 135 - 145 mmol/L 135 139 136  Potassium 3.5 - 5.1 mmol/L 5.8(H) 5.5(H) 5.5(H)  Chloride 98 - 111 mmol/L 109 112(H) 108  CO2 22 - 32 mmol/L 19(L) 19(L) 16(L)  Calcium 8.9 - 10.3 mg/dL 8.4(L) 8.7(L) 8.3(L)  Total Protein 6.5 - 8.1 g/dL - - -  Total Bilirubin 0.3 - 1.2 mg/dL - - -  Alkaline Phos 38 - 126 U/L - - -  AST 15 - 41 U/L - - -  ALT 0 - 44 U/L - - -    DG Chest 2 View  Result Date: 05/06/2021 CLINICAL DATA:  Shortness of breath EXAM: CHEST - 2 VIEW COMPARISON:  None. FINDINGS: Heart is mildly enlarged. Mediastinum appears normal. No focal consolidation identified in the lungs. Pulmonary vasculature is normal. No pleural effusion or pneumothorax. IMPRESSION: Cardiomegaly with no acute process identified. Electronically Signed   By: Ofilia Neas M.D.   On: 05/06/2021 12:17   US RENAL  Result Date: 05/06/2021 CLINICAL DATA:  Echogenic left kidney.  Right kidney absent. EXAM: RENAL / URINARY TRACT ULTRASOUND COMPLETE COMPARISON:  None. FINDINGS: LEFT kidney: Renal measurements: 8.9 x 5.1 x 3.8 cm = volume: 89 mL. Echogenicity increased. No mass or hydronephrosis visualized. RIGHT kidney: Not visualized. Urinary bladder bladder: Appears normal for degree of bladder distention. Other: None. IMPRESSION: 1. Atrophic left kidney with increased echogenicity suggestive of renal parenchymal disease. 2. Absent right kidney. Electronically Signed   By: Iven Finn M.D.   On: 05/06/2021 15:29   ECHOCARDIOGRAM COMPLETE  Result Date: 05/07/2021    ECHOCARDIOGRAM REPORT   Patient Name:   AKSHATH MCCAREY Date of Exam: 05/07/2021 Medical Rec #:  [id number]     Height:       69.0 in Accession #:    [id number]    Weight:       150.8 lb Date of Birth:  Jul 30, 1965     BSA:  1.832 m Patient Age:    9 years      BP:           140/96 mmHg Patient Gender: M             HR:           97 bpm. Exam Location:  Inpatient Procedure: 2D Echo, 3D Echo, Cardiac  Doppler, Color Doppler and Strain Analysis Indications:    R94.31 Abnormal EKG  History:        Patient has no prior history of Echocardiogram examinations.                 Risk Factors:Hypertension.  Sonographer:    Bernadene Person RDCS Referring Phys: [id number] [place]  1. Left ventricular ejection fraction, by estimation, is 20 to 25%. The left ventricle has severely decreased function. The left ventricle demonstrates global hypokinesis. The left ventricular internal cavity size was mildly dilated. There is moderate left ventricular hypertrophy. Left ventricular diastolic parameters are consistent with Grade I diastolic dysfunction (impaired relaxation). The average left ventricular global longitudinal strain is -9.0 %. The global longitudinal strain is abnormal. Strain is relatively preserved at the apical cap. Bright myocardium with LVH and relatively preserved apical strain suggests the possibility of cardiac amyloidosis.  2. Right ventricular systolic function is normal. The right ventricular size is normal. There is normal pulmonary artery systolic pressure. The estimated right ventricular systolic pressure is 03.0 mmHg.  3. The mitral valve is normal in structure. Trivial mitral valve regurgitation. No evidence of mitral stenosis.  4. The aortic valve is tricuspid. Aortic valve regurgitation is mild. No aortic stenosis is present.  5. Aortic dilatation noted. There is mild dilatation of the ascending aorta, measuring 37 mm.  6. The inferior vena cava is normal in size with greater than 50% respiratory variability, suggesting right atrial pressure of 3 mmHg. FINDINGS  Left Ventricle: Left ventricular ejection fraction, by estimation, is 20 to 25%. The left ventricle has severely decreased function. The left ventricle demonstrates global hypokinesis. The average left ventricular global longitudinal strain is -9.0 %. The global longitudinal strain is abnormal. The left ventricular internal  cavity size was mildly dilated. There is moderate left ventricular hypertrophy. Left ventricular diastolic parameters are consistent with Grade I diastolic dysfunction (impaired relaxation). Right Ventricle: The right ventricular size is normal. No increase in right ventricular wall thickness. Right ventricular systolic function is normal. There is normal pulmonary artery systolic pressure. The tricuspid regurgitant velocity is 1.79 m/s, and  with an assumed right atrial pressure of 3 mmHg, the estimated right ventricular systolic pressure is 09.2 mmHg. Left Atrium: Left atrial size was normal in size. Right Atrium: Right atrial size was normal in size. Pericardium: There is no evidence of pericardial effusion. Mitral Valve: The mitral valve is normal in structure. Trivial mitral valve regurgitation. No evidence of mitral valve stenosis. Tricuspid Valve: The tricuspid valve is normal in structure. Tricuspid valve regurgitation is trivial. Aortic Valve: The aortic valve is tricuspid. Aortic valve regurgitation is mild. No aortic stenosis is present. Pulmonic Valve: The pulmonic valve was normal in structure. Pulmonic valve regurgitation is not visualized. Aorta: The aortic root is normal in size and structure and aortic dilatation noted. There is mild dilatation of the ascending aorta, measuring 37 mm. Venous: The inferior vena cava is normal in size with greater than 50% respiratory variability, suggesting right atrial pressure of 3 mmHg. IAS/Shunts: No atrial level shunt detected by color flow Doppler.  LEFT VENTRICLE PLAX 2D LVIDd:         5.60 cm      Diastology LVIDs:         4.90 cm      LV e' medial:    3.31 cm/s LV PW:         1.40 cm      LV E/e' medial:  15.0 LV IVS:        0.90 cm      LV e' lateral:   3.37 cm/s LVOT diam:     2.10 cm      LV E/e' lateral: 14.7 LV SV:         43 LV SV Index:   24           2D Longitudinal Strain LVOT Area:     3.46 cm     2D Strain GLS Avg:     -9.0 %  LV Volumes (MOD) LV  vol d, MOD A2C: 130.0 ml 3D Volume EF: LV vol d, MOD A4C: 161.0 ml 3D EF:        37 % LV vol s, MOD A2C: 91.1 ml  LV EDV:       246 ml LV vol s, MOD A4C: 109.0 ml LV ESV:       155 ml LV SV MOD A2C:     38.9 ml  LV SV:        91 ml LV SV MOD A4C:     161.0 ml LV SV MOD BP:      45.2 ml RIGHT VENTRICLE RV S prime:     11.90 cm/s TAPSE (M-mode): 2.4 cm LEFT ATRIUM           Index        RIGHT ATRIUM           Index LA diam:      2.40 cm 1.31 cm/m   RA Area:     13.90 cm LA Vol (A2C): 43.5 ml 23.74 ml/m  RA Volume:   34.10 ml  18.61 ml/m LA Vol (A4C): 40.3 ml 21.99 ml/m  AORTIC VALVE LVOT Vmax:   91.30 cm/s LVOT Vmean:  56.400 cm/s LVOT VTI:    0.125 m  AORTA Ao Root diam: 3.20 cm Ao Asc diam:  3.70 cm MITRAL VALVE               TRICUSPID VALVE MV Area (PHT): 4.68 cm    TR Peak grad:   12.8 mmHg MV Decel Time: 162 msec    TR Vmax:        179.00 cm/s MV E velocity: 49.50 cm/s MV A velocity: 81.50 cm/s  SHUNTS MV E/A ratio:  0.61        Systemic VTI:  0.12 m                            Systemic Diam: 2.10 cm Dalton McleanMD Electronically signed by Franki Monte Signature Date/Time: 05/07/2021/12:37:12 PM    Final    VAS [place] UPPER EXT VEIN MAPPING (PRE-OP AVF)  Result Date: 05/07/2021 UPPER EXTREMITY VEIN MAPPING Patient Name:  KOLEMAN MARLING  Date of Exam:   05/07/2021 Medical Rec #: [id number]      Accession #:    [id number] Date of Birth: 1965/06/19      Patient Gender: M Patient Age:   74 years Exam Location:  [facility] Procedure:  VAS [place] UPPER EXT VEIN MAPPING (PRE-OP AVF) Referring Phys: JAY PATEL --------------------------------------------------------------------------------  Indications: Pre-access. History: CKD, congenital solitary kidney.  Comparison Study: No prior study Performing Technologist: Maudry Mayhew MHA, RDMS, RVT, RDCS  Examination Guidelines: A complete evaluation includes B-mode imaging, spectral Doppler, color Doppler, and power Doppler as needed of all accessible  portions of each vessel. Bilateral testing is considered an integral part of a complete examination. Limited examinations for reoccurring indications may be performed as noted. +-----------------+-------------+----------+--------------+ Right Cephalic   Diameter (cm)Depth (cm)   Findings    +-----------------+-------------+----------+--------------+ Shoulder                                not visualized +-----------------+-------------+----------+--------------+ Prox upper arm                          not visualized +-----------------+-------------+----------+--------------+ Mid upper arm                           not visualized +-----------------+-------------+----------+--------------+ Dist upper arm                          not visualized +-----------------+-------------+----------+--------------+ Antecubital fossa                       not visualized +-----------------+-------------+----------+--------------+ Prox forearm                            not visualized +-----------------+-------------+----------+--------------+ Mid forearm                             not visualized +-----------------+-------------+----------+--------------+ Dist forearm                            not visualized +-----------------+-------------+----------+--------------+ Wrist                                   not visualized +-----------------+-------------+----------+--------------+ +-----------------+-------------+----------+--------+ Right Basilic    Diameter (cm)Depth (cm)Findings +-----------------+-------------+----------+--------+ Dist upper arm       0.45                        +-----------------+-------------+----------+--------+ Antecubital fossa    0.46                        +-----------------+-------------+----------+--------+ Prox forearm         0.20                        +-----------------+-------------+----------+--------+  +-----------------+-------------+----------+---------+ Left Cephalic    Diameter (cm)Depth (cm)Findings  +-----------------+-------------+----------+---------+ Shoulder             0.37                         +-----------------+-------------+----------+---------+ Prox upper arm       0.50                         +-----------------+-------------+----------+---------+ Mid upper arm        0.18                         +-----------------+-------------+----------+---------+  Dist upper arm       0.43                         +-----------------+-------------+----------+---------+ Antecubital fossa    0.54               branching +-----------------+-------------+----------+---------+ Prox forearm         0.40                         +-----------------+-------------+----------+---------+ Mid forearm          0.39                         +-----------------+-------------+----------+---------+ Dist forearm         0.33                         +-----------------+-------------+----------+---------+ Wrist                0.31                         +-----------------+-------------+----------+---------+ *See table(s) above for measurements and observations.  Diagnosing physician: Deitra Mayo MD Electronically signed by Deitra Mayo MD on 05/07/2021 at 5:35:11 PM.    Final      Signed: Buddy Duty, D.O.  Internal Medicine Resident, PGY-1 [facility]  Pager: [phone] 9:11 AM, 05/15/2021

## 2021-05-15 NOTE — [facility] Nurse Program (Signed)
  Dept: [facility] Nurse Program Note  Date of Encounter: 05/15/2021  Past Medical History: Past Medical History:  Diagnosis Date   Congenital renal agenesis, unilateral    Hypertension    Unintentional weight loss     Encounter Details:  CNP Questionnaire - 05/15/21 1032       Questionnaire   Do you give verbal consent to treat you today? Yes    Location Patient Served  [facility]    Visit Setting Church or Organization;Phone/Text/Email    Patient Status Homeless    Insurance [employer] Referral N/A    Medication N/A    Medical Provider Yes    Screening Referrals N/A    Medical Referral Other    Medical Appointment Made N/A    Food N/A    Transportation N/A    Housing/Utilities No permanent housing    Interpersonal Safety N/A    Intervention Support    ED Visit Averted N/A    Life-Saving Intervention Made N/A            Client seen when arrived at [facility]. Had spoken with [employer] about need for housing. Emailed [employer] about options. Referred to [place] about housing and client was called for an intake. Offered to monitor BP for client as needed. Mitra Duling W RN CN [phone]

## 2021-05-15 NOTE — TOC Transition Note (Addendum)
Transition of Care [facility]) - CM/SW Discharge Note   Patient Details  Name: Phillip Lynch MRN: [id number] Date of Birth: September 28, 1965  Transition of Care [facility]) CM/SW Contact:  [place], Cash Phone Number: 05/15/2021, 9:09 AM   Clinical Narrative:    HF CSW spoke with [name] this morning and he reported he is all ready to go to the [facility] and very thankful and appreciative for all of the help. CSW reached out to the nurse to see if the CSW can arrange transportation and the nurse reported [name] is waiting on medications. CSW confirmed [name] received his medications and CSW arranged [name] a ride through the [place] portal online. CSW reached out to [place] at the [employer] ext [phone] to let her know that [name] is on his way but his ride is behind and Benita reported that he just needs to be at the [facility] before 2pm.  CSW received a secure chat from teaching service asking to cancel the follow up appointment at [facility] on 05/30/21 at 10:30 with Dr. Joya Gaskins as [name] will follow up in their outpatient clinic instead. CSW cancelled the appointment at [facility] per request.  CSW will sign off for now as social work intervention is no longer needed. Please consult [place] again if new needs arise.  Final next level of care: Homeless Shelter Barriers to Discharge: No Barriers Identified   Patient Goals and CMS Choice Patient states their goals for this hospitalization and ongoing recovery are:: return to work and find Archivist and Services In-house Referral: Clinical Social Work, Conservation officer, nature Services: CM Consult                                 Social Determinants of Health (SDOH) Interventions Food Insecurity Interventions: Intervention Not Indicated, Other (Comment) (Patient reported he has filled out a food stamp application and is awaiting to hear back) Financial Strain  Interventions: Development worker, community (CAFA involved screened for Medicaid, pending) Housing Interventions: Intervention Not Indicated, Other (Comment) (Resources provided and encouraged to reach out) Transportation Interventions: [employer]   Readmission Risk Interventions No flowsheet data found.  Turner Baillie, MSW, [place] Heart Failure Social Worker

## 2021-05-15 NOTE — TOC CM/SW Note (Addendum)
HF TOC CM completed MATCH with copay override. Pt is currently homeless. Spoke to pt and made aware that he will use [facility] for his refills and they provider to arrange at his follow up appt with set up refills for medications. Spoke to [facility] pharmacy to make aware the [facility] cost exceeds MATCH limit. The pharmacist will assist with pt getting medication. [place], Heart Failure TOC CM [phone]

## 2021-05-15 NOTE — Progress Notes (Signed)
Pt taught discharge information, IV removed and transportation set up for client

## 2021-05-22 ENCOUNTER — Encounter: Payer: Self-pay | Admitting: Student

## 2021-05-22 ENCOUNTER — Ambulatory Visit (INDEPENDENT_AMBULATORY_CARE_PROVIDER_SITE_OTHER): Payer: Self-pay | Admitting: Student

## 2021-05-22 ENCOUNTER — Other Ambulatory Visit (HOSPITAL_COMMUNITY): Payer: Self-pay

## 2021-05-22 VITALS — BP 145/85 | HR 91 | Temp 98.3°F | Wt 142.4 lb

## 2021-05-22 DIAGNOSIS — N185 Chronic kidney disease, stage 5: Secondary | ICD-10-CM

## 2021-05-22 DIAGNOSIS — Z0000 Encounter for general adult medical examination without abnormal findings: Secondary | ICD-10-CM | POA: Insufficient documentation

## 2021-05-22 DIAGNOSIS — I132 Hypertensive heart and chronic kidney disease with heart failure and with stage 5 chronic kidney disease, or end stage renal disease: Secondary | ICD-10-CM

## 2021-05-22 DIAGNOSIS — Z992 Dependence on renal dialysis: Secondary | ICD-10-CM

## 2021-05-22 DIAGNOSIS — I5020 Unspecified systolic (congestive) heart failure: Secondary | ICD-10-CM

## 2021-05-22 DIAGNOSIS — Z23 Encounter for immunization: Secondary | ICD-10-CM

## 2021-05-22 DIAGNOSIS — R634 Abnormal weight loss: Secondary | ICD-10-CM

## 2021-05-22 DIAGNOSIS — D631 Anemia in chronic kidney disease: Secondary | ICD-10-CM

## 2021-05-22 DIAGNOSIS — N186 End stage renal disease: Secondary | ICD-10-CM

## 2021-05-22 DIAGNOSIS — I10 Essential (primary) hypertension: Secondary | ICD-10-CM

## 2021-05-22 HISTORY — DX: Chronic kidney disease, stage 5: N18.5

## 2021-05-22 MED ORDER — CARVEDILOL 25 MG PO TABS
25.0000 mg | ORAL_TABLET | Freq: Two times a day (BID) | ORAL | 2 refills | Status: DC
  Filled 2021-05-22: qty 60, 30d supply, fill #0

## 2021-05-22 NOTE — Assessment & Plan Note (Signed)
Patient with history of longstanding uncontrolled hypertension.  He was started on carvedilol, hydralazine, Imdur and amlodipine in the hospital.  His blood pressure today is much improved compared to before and 145/85.  He reports adherence to his medication.  -Will increase carvedilol to 25 mg twice daily.  His heart rate is 91 today. -Continue amlodipine 10 mg daily -Continue hydralazine 50 mg 3 times daily -Continue Imdur 10 mg 3 times daily -BMP today

## 2021-05-22 NOTE — Assessment & Plan Note (Signed)
Flu shot given today Check for hepatitis C

## 2021-05-22 NOTE — Assessment & Plan Note (Signed)
Baseline hemoglobin around 8 - 9.  He received 2 dose of IV iron and was started on EPO.

## 2021-05-22 NOTE — Patient Instructions (Signed)
Mr. Govan,  It was a pleasure seeing you in the clinic today.  Here is a summary what we talked about:  1.  High blood pressure: Your blood pressure is much better today compared to before.  There is still room for improvement.  I will increase the Coreg (Carvedilol) to 25 mg twice daily.   Please continue amlodipine, hydralazine and Imdur.  2.  Chronic kidney disease: You have an appointment with [facility] next week.  Please make sure that you keep that appointment.  3.  Heart failure: I am glad that you are doing well.  We will have you follow-up with the heart failure clinic.  4.  Weight loss: Please keep up with your nutrition.  Once you have the orange card, we will schedule you for a colonoscopy.  Please return in 3 months  Take care,  Dr. Alfonse Spruce

## 2021-05-22 NOTE — Assessment & Plan Note (Addendum)
He was admitted in November 2022 for influenza infection, found to have worsening of his CKD.  Underlying etiology was thought due to uncontrolled hypertension.  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  He has a fistula placed but did not require urgent dialysis.  He was seen previously in the hospital in 2021 for CKD.  His ANA and ribonucleoprotein was positive at that time.  Patient however did not follow-up with nephrology after discharge.  Repeat ANA he is still positive.  No kidney biopsy was performed.  Today patient reports doing well.  No signs of fluid overload on exam.  Reports that he still make good amount of urine daily.  -Repeat BMP today.  Last potassium was 5.8.  He is taking Lokelma 10 g daily. -Follow-up with [facility] in 1 week

## 2021-05-22 NOTE — Assessment & Plan Note (Addendum)
Given his positive ribonucleoprotein and evidence of atrial enlargement on EKG.  An echocardiogram was performed which showed EF of 20-25% with LVH.  He has no other symptoms of connective tissue disease.  Cardiology was consulted and thought that this is hypertensive cardiomyopathy.  Amyloid scan came back equivocal.  Multiple myeloma panel were also unremarkable.  No ischemic work-up was performed.  He was started on medical management with carvedilol, amlodipine, hydralazine and Imdur.  Patient appears euvolemic on exam today.  His heart failure is well compensated.  He still makes good amount of urine daily.  He was started on Lasix in the hospital but was discontinued due to worsening kidney function.  -Continue Coreg, amlodipine, hydralazine and Imdur -He has an appointment to follow-up with cardiology in January 3 -Advised patient to let us know if he notice any increase in swelling or weight gain 

## 2021-05-22 NOTE — Progress Notes (Signed)
   CC: hospital follow up for CKD and HFrEF  HPI:  Phillip Lynch is a 55 y.o. with past medical history of CKD 5, hypertensive cardiomyopathy who presented to the clinic today for hospital follow-up.  He was admitted in November 2022 for influenza infection, found to have worsening of his CKD.  Underlying etiology was thought due to uncontrolled hypertension.  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  He has a fistula placed but did not require urgent dialysis.  He was seen previously in the hospital in 2021 for CKD.  His ANA and ribonucleoprotein was positive at that time.  Patient however did not follow-up with nephrology after discharge.  Repeat ANA he is still positive.  No kidney biopsy was performed.  Given his positive ribonucleoprotein and evidence of atrial enlargement on EKG.  An echocardiogram was performed which showed EF of 20-25% with LVH.  He has no other symptoms of connective tissue disease.  Cardiology was consulted and thought that this is hypertensive cardiomyopathy.  Amyloid scan came back equivocal.  Multiple myeloma panel were also unremarkable.  Today he reports feeling well without any issue.  He report adherence to his medication.  Please see his problem based charting for details  Past Medical History:  Diagnosis Date   Congenital renal agenesis, unilateral    Hypertension    Unintentional weight loss    Review of Systems:  per HPI  Physical Exam:  Vitals:   05/22/21 0941  BP: (!) 145/85  Pulse: 91  Temp: 98.3 F (36.8 C)  TempSrc: Oral  SpO2: 100%  Weight: 142 lb 6.4 oz (64.6 kg)   Physical Exam Constitutional:      General: He is not in acute distress.    Appearance: He is not toxic-appearing.  HENT:     Head: Normocephalic.  Eyes:     General:        Right eye: No discharge.        Left eye: No discharge.     Conjunctiva/sclera: Conjunctivae normal.  Cardiovascular:     Rate and Rhythm: Normal rate and regular rhythm.      Heart sounds: Normal heart sounds.     Comments: No JVD.  Trace LE edema.  Patient appears euvolemic on exam. Pulmonary:     Effort: Pulmonary effort is normal. No respiratory distress.     Breath sounds: Normal breath sounds. No wheezing.  Abdominal:     General: There is no distension.     Palpations: Abdomen is soft.     Tenderness: There is no abdominal tenderness.  Musculoskeletal:        General: Normal range of motion.  Skin:    General: Skin is warm.     Coloration: Skin is not jaundiced.  Neurological:     General: No focal deficit present.     Mental Status: He is alert and oriented to person, place, and time.  Psychiatric:        Mood and Affect: Mood normal.        Behavior: Behavior normal.     Assessment & Plan:   See Encounters Tab for problem based charting.  Patient discussed with Dr. Dareen Piano

## 2021-05-22 NOTE — Assessment & Plan Note (Addendum)
He reports history of unintentional weight loss 25-30 pounds over the last 34-month.  He has multiple risk factor including food acquisition, CKD and newly diagnosed HFrEF.  He denies any GI symptoms.    Encourage patient to keep up with his nutrition.  He is currently have food stamp and report adequate food consumption.  When he obtains the orange card, will refer him to GI for colonoscopy.

## 2021-05-23 ENCOUNTER — Emergency Department (HOSPITAL_COMMUNITY)
Admission: EM | Admit: 2021-05-23 | Discharge: 2021-05-23 | Disposition: A | Discharge status: Home / Self Care | Payer: Medicaid Other | Attending: Emergency Medicine | Admitting: Emergency Medicine

## 2021-05-23 ENCOUNTER — Other Ambulatory Visit: Payer: Self-pay

## 2021-05-23 DIAGNOSIS — F17200 Nicotine dependence, unspecified, uncomplicated: Secondary | ICD-10-CM | POA: Diagnosis not present

## 2021-05-23 DIAGNOSIS — I120 Hypertensive chronic kidney disease with stage 5 chronic kidney disease or end stage renal disease: Secondary | ICD-10-CM | POA: Insufficient documentation

## 2021-05-23 DIAGNOSIS — E875 Hyperkalemia: Secondary | ICD-10-CM | POA: Insufficient documentation

## 2021-05-23 DIAGNOSIS — Z79899 Other long term (current) drug therapy: Secondary | ICD-10-CM | POA: Diagnosis not present

## 2021-05-23 DIAGNOSIS — N185 Chronic kidney disease, stage 5: Secondary | ICD-10-CM | POA: Diagnosis not present

## 2021-05-23 LAB — CBC WITH DIFFERENTIAL/PLATELET
Abs Immature Granulocytes: 0 10*3/uL (ref 0.00–0.07)
Basophils Absolute: 0 10*3/uL (ref 0.0–0.1)
Basophils Relative: 1 %
Eosinophils Absolute: 0.1 10*3/uL (ref 0.0–0.5)
Eosinophils Relative: 2 %
HCT: 27.2 % — ABNORMAL LOW (ref 39.0–52.0)
Hemoglobin: 8.3 g/dL — ABNORMAL LOW (ref 13.0–17.0)
Lymphocytes Relative: 15 %
Lymphs Abs: 0.7 10*3/uL (ref 0.7–4.0)
MCH: 33.7 pg (ref 26.0–34.0)
MCHC: 30.5 g/dL (ref 30.0–36.0)
MCV: 110.6 fL — ABNORMAL HIGH (ref 80.0–100.0)
Monocytes Absolute: 0.2 10*3/uL (ref 0.1–1.0)
Monocytes Relative: 5 %
Neutro Abs: 3.4 10*3/uL (ref 1.7–7.7)
Neutrophils Relative %: 77 %
Platelets: 209 10*3/uL (ref 150–400)
RBC: 2.46 MIL/uL — ABNORMAL LOW (ref 4.22–5.81)
RDW: 15.6 % — ABNORMAL HIGH (ref 11.5–15.5)
WBC: 4.4 10*3/uL (ref 4.0–10.5)
nRBC: 0 % (ref 0.0–0.2)
nRBC: 0 /100 WBC

## 2021-05-23 LAB — COMPREHENSIVE METABOLIC PANEL
ALT: 33 U/L (ref 0–44)
AST: 30 U/L (ref 15–41)
Albumin: 3.4 g/dL — ABNORMAL LOW (ref 3.5–5.0)
Alkaline Phosphatase: 66 U/L (ref 38–126)
Anion gap: 9 (ref 5–15)
BUN: 60 mg/dL — ABNORMAL HIGH (ref 6–20)
CO2: 19 mmol/L — ABNORMAL LOW (ref 22–32)
Calcium: 8.2 mg/dL — ABNORMAL LOW (ref 8.9–10.3)
Chloride: 110 mmol/L (ref 98–111)
Creatinine, Ser: 6.13 mg/dL — ABNORMAL HIGH (ref 0.61–1.24)
GFR, Estimated: 10 mL/min — ABNORMAL LOW (ref >60)
Glucose, Bld: 105 mg/dL — ABNORMAL HIGH (ref 70–99)
Potassium: 5.7 mmol/L — ABNORMAL HIGH (ref 3.5–5.1)
Sodium: 138 mmol/L (ref 135–145)
Total Bilirubin: 0.5 mg/dL (ref 0.3–1.2)
Total Protein: 6.6 g/dL (ref 6.5–8.1)

## 2021-05-23 LAB — HEPATITIS C ANTIBODY: Hep C Virus Ab: 0.2 s/co ratio (ref 0.0–0.9)

## 2021-05-23 LAB — BMP8+ANION GAP
Anion Gap: 16 mmol/L (ref 10.0–18.0)
BUN/Creatinine Ratio: 10 (ref 9–20)
BUN: 58 mg/dL — ABNORMAL HIGH (ref 6–24)
CO2: 17 mmol/L — ABNORMAL LOW (ref 20–29)
Calcium: 8.3 mg/dL — ABNORMAL LOW (ref 8.7–10.2)
Chloride: 105 mmol/L (ref 96–106)
Creatinine, Ser: 5.78 mg/dL — ABNORMAL HIGH (ref 0.76–1.27)
Glucose: 93 mg/dL (ref 70–99)
Potassium: 6.2 mmol/L — ABNORMAL HIGH (ref 3.5–5.2)
Sodium: 138 mmol/L (ref 134–144)
eGFR: 11 mL/min/{1.73_m2} — ABNORMAL LOW (ref >59)

## 2021-05-23 NOTE — Progress Notes (Signed)
Internal Medicine Clinic Attending  Case discussed with Dr. Nguyen  At the time of the visit.  We reviewed the resident's history and exam and pertinent patient test results.  I agree with the assessment, diagnosis, and plan of care documented in the resident's note. 

## 2021-05-23 NOTE — ED Provider Notes (Signed)
Emergency Medicine Provider Triage Evaluation Note  Phillip Lynch , a 55 y.o. male  was evaluated in triage.  Pt complains of hyperkalemia.  Called by his PCP to come here for elevated potassium.  No symptoms.  Recently admitted for AKI.  He has an AV fistula placed last week, has not yet started dialysis.  Still producing urine.  No shortness of breath or leg swelling.  Review of Systems  Positive: hyperkalemia Negative: Muscle cramps  Physical Exam  BP (!) 149/99 (BP Location: Right Arm)   Pulse 88   Temp 98.6 F (37 C) (Oral)   Resp 16   SpO2 100%  Gen:   Awake, no distress   Resp:  Normal effort  MSK:   Moves extremities without difficulty. No leg swelling Other:  No ttp of abd  Medical Decision Making  Medically screening exam initiated at 11:06 AM.  Appropriate orders placed.  Phillip Lynch was informed that the remainder of the evaluation will be completed by another provider, this initial triage assessment does not replace that evaluation, and the importance of remaining in the ED until their evaluation is complete.  Labs, ekg   [place], [place] 05/23/21 1110    Lucrezia Starch, MD 05/24/21 1506

## 2021-05-23 NOTE — Discharge Instructions (Addendum)
Continue your daily prescribed medications including your Lokelma packets.  Follow-up with internal medicine in the clinic.

## 2021-05-23 NOTE — Progress Notes (Signed)
Assessed patient in the ED. Initially advised to come to the ED by PCP due to elevated potassium on labs (K 6.2) in the setting of CKD V with recent AV fistula placement in anticipation for dialysis in the future.   Potassium 5.7 today and bicarbonate 19, which is stable. EKG showing no signs of hyperkalemic changes and he is asymptomatic.   Discussed why he was advised to come in and patient understood the concerns. He asked about ways to keep potassium low as an outpatient. Discussed to continue taking his home lokelma (which he has enough packets to last him through next week). He has an upcoming nephrology appointment next Wednesday (12/14), where they will be able to recheck chronic kidney labs. Otherwise, discussed foods to avoid as well.   Overall, patient is very stable at this time and can be discharged with home lokelma. Advised him to take two packets (total 20g) for today and tomorrow, then to resume daily lokelma (10g) thereafter. Emphasized importance of going to nephrology appointment, he confirms understanding.

## 2021-05-23 NOTE — ED Provider Notes (Signed)
[facility] EMERGENCY DEPARTMENT Provider Note   CSN: [id number] Arrival date & time: 05/23/21  1059     History Chief Complaint  Patient presents with   Abnormal Lab    Phillip Lynch is a 55 y.o. male.  55 y.o. male with history of CKD 5, hypertensive cardiomyopathy presents to the emergency department at the advice of his primary care doctor.  Had outpatient labs drawn yesterday which noted potassium of 6.2; potassium was 5.8 at time of discharge on 05/15/21.  He also had a decreasing bicarb.  There was concern that the patient may need to more urgently start dialysis and he was instructed to come to the ED.  He denies any complaints at this time.  No chest pain, shortness of breath.  He has been compliant with his Lokelma and his other daily medications.  The history is provided by the patient. No language interpreter was used.  Abnormal Lab     Past Medical History:  Diagnosis Date   Congenital renal agenesis, unilateral    Hypertension    Unintentional weight loss     Patient Active Problem List   Diagnosis Date Noted   CKD (chronic kidney disease) stage 5, GFR less than 15 ml/min (HCC) 05/22/2021   Anemia of chronic kidney failure, stage 5 ([place]) 05/22/2021   Health care maintenance 05/22/2021   Protein-calorie malnutrition, severe 05/10/2021   Unintentional weight loss 05/07/2021   HFrEF (heart failure with reduced ejection fraction) ([place])    Polysubstance use disorder    Solitary kidney 07/22/2019   Uncontrolled hypertension 07/22/2019    Past Surgical History:  Procedure Laterality Date   AV FISTULA PLACEMENT Left 05/13/2021   Procedure: ARTERIOVENOUS (AV) FISTULA CREATION;  Surgeon: Broadus John, MD;  Location: [facility] OR;  Service: Vascular;  Laterality: Left;       Family History  Problem Relation Age of Onset   Stroke Mother    Dementia Mother    Kidney disease Mother     Social History   Tobacco Use   Smoking status: Every Day    Smokeless tobacco: Never  Substance Use Topics   Alcohol use: Yes    Comment: Weekends.    Home Medications Prior to Admission medications   Medication Sig Start Date End Date Taking? Authorizing Provider  amLODipine (NORVASC) 10 MG tablet Take 1 tablet (10 mg total) by mouth daily. 05/15/21 06/14/21 Yes Atway, Rayann N, DO  carvedilol (COREG) 25 MG tablet Take 1 tablet (25 mg total) by mouth 2 (two) times daily with a meal. 05/22/21 08/20/21 Yes Gaylan Gerold, DO  guaiFENesin (MUCINEX) 600 MG 12 hr tablet Take 600 mg by mouth 2 (two) times daily.   Yes [provider]  hydrALAZINE (APRESOLINE) 50 MG tablet Take 1 tablet (50 mg total) by mouth every 8 (eight) hours. 05/15/21 06/14/21 Yes Atway, Rayann N, DO  isosorbide dinitrate (ISORDIL) 10 MG tablet Take 1 tablet (10 mg total) by mouth 3 (three) times daily. 05/15/21 06/14/21 Yes Atway, Rayann N, DO  sodium bicarbonate 650 MG tablet Take 2 tablets (1,300 mg total) by mouth 2 (two) times daily. 05/15/21 06/14/21 Yes Atway, Rayann N, DO  sodium zirconium cyclosilicate (LOKELMA) 10 g PACK packet Take 1 packet (10 g) by mouth daily. 05/15/21 06/14/21 Yes Atway, Rayann N, DO  ferrous sulfate 325 (65 FE) MG tablet Take 1 tablet (325 mg total) by mouth daily with breakfast. 07/23/19 08/22/19  Paticia Stack, MD    Allergies  Nsaids  Review of Systems   Review of Systems Ten systems reviewed and are negative for acute change, except as noted in the HPI.    Physical Exam Updated Vital Signs BP (!) 151/97 (BP Location: Right Arm)   Pulse 88   Temp 98.6 F (37 C) (Oral)   Resp 16   SpO2 100%   Physical Exam Vitals and nursing note reviewed.  Constitutional:      General: He is not in acute distress.    Appearance: He is well-developed. He is not diaphoretic.     Comments: Nontoxic appearing male, pleasant.  HENT:     Head: Normocephalic and atraumatic.  Eyes:     General: No scleral icterus.    Conjunctiva/sclera:  Conjunctivae normal.  Pulmonary:     Effort: Pulmonary effort is normal. No respiratory distress.     Breath sounds: No stridor.     Comments: Respirations even and unlabored Musculoskeletal:        General: Normal range of motion.     Cervical back: Normal range of motion.  Skin:    General: Skin is warm and dry.     Coloration: Skin is not pale.     Findings: No erythema or rash.  Neurological:     Mental Status: He is alert and oriented to person, place, and time.     Coordination: Coordination normal.  Psychiatric:        Behavior: Behavior normal.    ED Results / Procedures / Treatments   Labs (all labs ordered are listed, but only abnormal results are displayed) Labs Reviewed  CBC WITH DIFFERENTIAL/PLATELET - Abnormal; Notable for the following components:      Result Value   RBC 2.46 (*)    Hemoglobin 8.3 (*)    HCT 27.2 (*)    MCV 110.6 (*)    RDW 15.6 (*)    All other components within normal limits  COMPREHENSIVE METABOLIC PANEL - Abnormal; Notable for the following components:   Potassium 5.7 (*)    CO2 19 (*)    Glucose, Bld 105 (*)    BUN 60 (*)    Creatinine, Ser 6.13 (*)    Calcium 8.2 (*)    Albumin 3.4 (*)    GFR, Estimated 10 (*)    All other components within normal limits    EKG EKG Interpretation  Date/Time:  Thursday May 23 2021 11:06:07 EST Ventricular Rate:  90 PR Interval:  144 QRS Duration: 78 QT Interval:  386 QTC Calculation: 472 R Axis:   158 Text Interpretation: Normal sinus rhythm Possible Left atrial enlargement Right axis deviation T waves previously peaked, now decreased in size when compared to Nov 2022 Confirmed by Sherwood Gambler [phone]) on 05/23/2021 1:23:10 PM   Radiology No results found.  Procedures Procedures   Medications Ordered in ED Medications - No data to display   ED Course  I have reviewed the triage vital signs and the nursing notes.  Pertinent labs & imaging results that were available during  my care of the patient were reviewed by me and considered in my medical decision making (see chart for details).  Clinical Course as of 05/23/21 1807  Thu May 23, 2021  1705 Labs repeated on arrival which are fairly stable in comparison to his hospital discharge ~ 1 week ago. Called placed to IM teaching service to discuss plan for disposition. [EZ]  [id number] IM resident to discuss case with team and attending and call back with  recommendations. [KH]    Clinical Course User Index [KH] Beverely Pace   MDM Rules/Calculators/A&P                           55 year old male presents to the emergency department for evaluation.  Had outpatient labs drawn yesterday with a potassium of 6.2.  His potassium was repeated today and noted to be 5.7 in the department.  This is, overall, stable compared to hospital discharge.  He reports compliance with his Lokelma.  No evidence of peaked T waves on EKG today.  The patient is presently asymptomatic, hemodynamically stable.  He has been assessed at the bedside by the internal medicine teaching service in consultation.  They feel he is stable to continue follow-up in their clinic.  Advised patient to continue his prescribed medications and to return for new or concerning symptoms.  Discharged with no unaddressed concerns.   Final Clinical Impression(s) / ED Diagnoses Final diagnoses:  Hyperkalemia    Rx / DC Orders ED Discharge Orders     None        Antonietta Breach, PA-C 05/23/21 1808    Regan Lemming, MD 05/23/21 1842

## 2021-05-23 NOTE — ED Triage Notes (Signed)
Pt. Stated, Im here Im here cause they called me to have some more labs , I think they said it was my K

## 2021-05-30 ENCOUNTER — Inpatient Hospital Stay: Payer: Self-pay | Admitting: Critical Care Medicine

## 2021-05-30 ENCOUNTER — Other Ambulatory Visit (HOSPITAL_COMMUNITY): Payer: Self-pay

## 2021-06-10 ENCOUNTER — Telehealth: Payer: Self-pay

## 2021-06-10 NOTE — Telephone Encounter (Signed)
06/10/21 10:07am - HF CSW received a call and voicemail from [name] reporting that he will be out of his medications soon and he can't afford to pick up the refill they called in for him at [facility] that will around $74 without him having health insurance or his orange card coming through yet.  1:16pm - HF CSW called [name] back and encouraged him to call the prescribing doctor back and letting them know that he cannot afford the medications and to discuss other options if possible and see if they have a case manager on staff that he can speak with if he cannot get in touch with the provider. CSW also encouraged [name] to ask for [facility] and ask for a prescription discount card from the prescribing provider and let them know about his financial situation and barriers in his ongoing care.   Phillip Lynch, MSW, [place] Heart Failure Social Worker

## 2021-06-13 ENCOUNTER — Other Ambulatory Visit: Payer: Self-pay | Admitting: *Deleted

## 2021-06-13 DIAGNOSIS — N185 Chronic kidney disease, stage 5: Secondary | ICD-10-CM

## 2021-06-18 ENCOUNTER — Other Ambulatory Visit: Payer: Self-pay | Admitting: Pharmacist

## 2021-06-18 ENCOUNTER — Other Ambulatory Visit: Payer: Self-pay

## 2021-06-18 ENCOUNTER — Ambulatory Visit: Payer: Self-pay | Admitting: Cardiology

## 2021-06-18 ENCOUNTER — Encounter: Payer: Self-pay | Admitting: Cardiology

## 2021-06-18 VITALS — BP 136/80 | HR 109 | Temp 97.4°F | Resp 16 | Ht 69.0 in | Wt 141.6 lb

## 2021-06-18 DIAGNOSIS — I5042 Chronic combined systolic (congestive) and diastolic (congestive) heart failure: Secondary | ICD-10-CM

## 2021-06-18 DIAGNOSIS — I130 Hypertensive heart and chronic kidney disease with heart failure and stage 1 through stage 4 chronic kidney disease, or unspecified chronic kidney disease: Secondary | ICD-10-CM

## 2021-06-18 DIAGNOSIS — I429 Cardiomyopathy, unspecified: Secondary | ICD-10-CM

## 2021-06-18 DIAGNOSIS — Z8709 Personal history of other diseases of the respiratory system: Secondary | ICD-10-CM

## 2021-06-18 DIAGNOSIS — I10 Essential (primary) hypertension: Secondary | ICD-10-CM

## 2021-06-18 MED ORDER — CARVEDILOL 25 MG PO TABS: 25.0000 mg | ORAL_TABLET | Freq: Two times a day (BID) | ORAL | 2 refills | Status: DC

## 2021-06-18 MED ORDER — ISOSORBIDE MONONITRATE ER 30 MG PO TB24: 30.0000 mg | ORAL_TABLET | Freq: Every day | ORAL | 2 refills | Status: DC

## 2021-06-18 MED ORDER — HYDRALAZINE HCL 25 MG PO TABS: 50.0000 mg | ORAL_TABLET | Freq: Three times a day (TID) | ORAL | 2 refills | Status: DC

## 2021-06-18 NOTE — Progress Notes (Signed)
Date:  06/18/2021   ID:  [name], DOB [birth year], MRN [id number]  PCP:  Patient, [name] (Inactive)  Cardiologist:  Rex Kras, DO, [facility] (established care 05/07/2021) Advanced heart failure specialist: Dr. Haroldine Laws  REQUESTING PHYSICIAN:  No referring provider defined for this encounter.  Chief Complaint  Patient presents with   Congestive Heart Failure   New Patient (Initial Visit)    HPI  Phillip Lynch is a 56 y.o. 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   Patient is here status posthospitalization follow-up after being seen for heart failure management in November 2022.  Patient initially presented with symptoms of cold and flu and was diagnosed with influenza A infection.  He underwent an echocardiogram which noted severely reduced LVEF and concern for possible cardiac amyloidosis.  Cardiology was consulted for further evaluation and management.  In addition, given his history of solitary kidney and chronic kidney disease and uncontrolled hypertension he was also evaluated by nephrology and underwent fistula placement.  Patient's medications have been uptitrated to the maximally tolerated doses prior to discharge.  He recently ran out of carvedilol, sodium bicarbonate, amlodipine for the last 1 week at least.  He is currently taking hydralazine and isosorbide dinitrate.  He is currently not on ACE inhibitor's/ARB/Arni/Farxiga/diuretic therapy due to his renal function.  Since hospitalization he had another ED visit for hyperkalemia for which she is now on [facility].  Barriers to care include no insurance  and pending disability approval.  Clinically denies any chest pain or angina pectoris.  He is overall euvolemic and not in congestive heart failure.  He is no longer using illicit drugs.  FUNCTIONAL STATUS: No structured exercise program or daily routine.   ALLERGIES: Allergies  Allergen Reactions   Nsaids Other (See Comments)    Patient was born with only 1 kidney and was told to not take NSAID(s)    MEDICATION LIST PRIOR TO VISIT: Current Meds  Medication Sig   benzonatate (TESSALON) 200 MG capsule Take 200 mg by mouth 3 (three) times daily as needed for cough.   ferrous sulfate 325 (65 FE) MG tablet Take 1 tablet (325 mg total) by mouth daily with breakfast.   guaiFENesin (MUCINEX) 600 MG 12 hr tablet Take 600 mg by mouth 2 (two) times daily.   sodium bicarbonate 650 MG tablet Take 650 mg by mouth 2 (two) times daily.   sodium zirconium cyclosilicate (LOKELMA) 10 g PACK packet Take 10 g by mouth.   [DISCONTINUED] amLODipine (NORVASC) 10 MG tablet Take 1 tablet (10 mg total) by mouth daily.   [DISCONTINUED] carvedilol (COREG) 25 MG tablet Take 1 tablet (25 mg total) by mouth 2 (two) times daily with a meal.   [DISCONTINUED] hydrALAZINE (APRESOLINE) 50 MG tablet Take 1 tablet (50 mg total) by mouth every 8 (eight) hours.   [DISCONTINUED] isosorbide dinitrate (ISORDIL) 10 MG tablet Take 1 tablet (10 mg total) by mouth 3 (three) times daily.     PAST MEDICAL HISTORY: Past Medical History:  Diagnosis Date   Congenital renal agenesis, unilateral    Hypertension    Unintentional weight loss     PAST SURGICAL HISTORY: Past Surgical History:  Procedure Laterality Date  AV FISTULA PLACEMENT Left 05/13/2021   Procedure: ARTERIOVENOUS (AV) FISTULA CREATION;  Surgeon: Broadus John, MD;  Location: [facility];  Service: Vascular;  Laterality: Left;    FAMILY HISTORY: The patient family history includes Dementia in his mother; Kidney disease in his mother; Stroke in his  mother.  SOCIAL HISTORY:  The patient  reports that he quit smoking about 2 months ago. His smoking use included cigars. He has never used smokeless tobacco. He reports that he does not currently use alcohol. He reports that he does not currently use drugs after having used the following drugs: Marijuana.  REVIEW OF SYSTEMS: Review of Systems  Constitutional: Positive for malaise/fatigue. Negative for chills and fever.  HENT:  Negative for hoarse voice and nosebleeds.   Eyes:  Negative for discharge, double vision and pain.  Cardiovascular:  Negative for chest pain, claudication, dyspnea on exertion, leg swelling, near-syncope, orthopnea, palpitations, paroxysmal nocturnal dyspnea and syncope.  Respiratory:  Negative for hemoptysis and shortness of breath.   Musculoskeletal:  Negative for muscle cramps and myalgias.  Gastrointestinal:  Negative for abdominal pain, constipation, diarrhea, hematemesis, hematochezia, melena, nausea and vomiting.  Neurological:  Negative for dizziness and light-headedness.   PHYSICAL EXAM: Vitals with BMI 06/18/2021 05/23/2021 05/23/2021  Height 5' 9" - -  Weight 141 lbs 10 oz - -  BMI 10.9 - -  Systolic 323 557 322  Diastolic 80 97 99  Pulse 025 88 88    CONSTITUTIONAL: Well-developed and well-nourished. No acute distress.  SKIN: Skin is warm and dry. No rash noted. No cyanosis. No pallor. No jaundice HEAD: Normocephalic and atraumatic.  EYES: No scleral icterus MOUTH/THROAT: Moist oral membranes.  NECK: No JVD present. No thyromegaly noted. No carotid bruits  LYMPHATIC: No visible cervical adenopathy.  CHEST Normal respiratory effort. No intercostal retractions  LUNGS: Clear to auscultation bilaterally.  No stridor. No wheezes. No rales.  CARDIOVASCULAR: Regular rate and rhythm, positive S1-S2, no murmurs rubs or gallops appreciated. ABDOMINAL: Soft, nontender, nondistended, positive bowel sounds in all 4 quadrants, no apparent ascites.  EXTREMITIES:  No peripheral edema, warm to touch, 2+ DP and PT pulses bilaterally. HEMATOLOGIC: No significant bruising NEUROLOGIC: Oriented to person, place, and time. Nonfocal. Normal muscle tone.  PSYCHIATRIC: Normal mood and affect. Normal behavior. Cooperative  CARDIAC DATABASE: EKG: 06/18/2021: Sinus tachycardia, 104 bpm, normal axis, without underlying ischemia or injury pattern.   Echocardiogram: 05/07/2021:  1. Left ventricular ejection fraction, by estimation, is 20 to 25%. The  left ventricle has severely decreased function. The left ventricle  demonstrates global hypokinesis. The left ventricular internal cavity size  was mildly dilated. There is moderate  left ventricular hypertrophy. Left ventricular diastolic parameters are  consistent with Grade I diastolic dysfunction (impaired relaxation). The  average left ventricular global longitudinal strain is -9.0 %. The global  longitudinal strain is abnormal.  Strain is relatively preserved at the apical cap. Bright myocardium with  LVH and relatively preserved apical strain suggests the possibility of  cardiac amyloidosis.   2. Right ventricular systolic function is normal. The right ventricular  size is normal. There is normal pulmonary artery systolic pressure. The  estimated right ventricular systolic pressure is 42.7 mmHg.   3. The mitral valve is normal in structure. Trivial mitral valve  regurgitation. No evidence of mitral stenosis.   4. The aortic valve is tricuspid. Aortic valve regurgitation is mild. No  aortic stenosis is present.   5. Aortic dilatation noted. There is mild dilatation of the  ascending  aorta, measuring 37 mm.   6. The inferior vena cava is normal in size with greater than 50%  respiratory variability, suggesting right atrial pressure of 3 mmHg.    Stress Testing: No results found for this or any previous visit from the past 1095 days.  Heart Catheterization: None  PYP Study  05/10/2021: Visual and  quantitative assessment (grade 1, H/CLL equal 1.22) are equivocally suggestive of transthyretin amyloidosis.  LABORATORY DATA: CBC Latest Ref Rng & Units 05/23/2021 05/15/2021 05/14/2021  WBC 4.0 - 10.5 K/uL 4.4 6.8 6.7  Hemoglobin 13.0 - 17.0 g/dL 8.3(L) 8.9(L) 8.8(L)  Hematocrit 39.0 - 52.0 % 27.2(L) 27.9(L) 28.0(L)  Platelets 150 - 400 K/uL 209 186 178    CMP Latest Ref Rng & Units 05/23/2021 05/22/2021 05/15/2021  Glucose 70 - 99 mg/dL 105(H) 93 101(H)  BUN 6 - 20 mg/dL 60(H) 58(H) 79(H)  Creatinine 0.61 - 1.24 mg/dL 6.13(H) 5.78(H) 7.05(H)  Sodium 135 - 145 mmol/L 138 138 135  Potassium 3.5 - 5.1 mmol/L 5.7(H) 6.2(H) 5.8(H)  Chloride 98 - 111 mmol/L 110 105 109  CO2 22 - 32 mmol/L 19(L) 17(L) 19(L)  Calcium 8.9 - 10.3 mg/dL 8.2(L) 8.3(L) 8.4(L)  Total Protein 6.5 - 8.1 g/dL 6.6 - -  Total Bilirubin 0.3 - 1.2 mg/dL 0.5 - -  Alkaline Phos 38 - 126 U/L 66 - -  AST 15 - 41 U/L 30 - -  ALT 0 - 44 U/L 33 - -    Lipid Panel     Component Value Date/Time   CHOL 151 05/07/2021 1644   TRIG 135 05/07/2021 1644   HDL 66 05/07/2021 1644   CHOLHDL 2.3 05/07/2021 1644   VLDL 27 05/07/2021 1644   LDLCALC 58 05/07/2021 1644    No components found for: NTPROBNP No results for input(s): PROBNP in the last 8760 hours. Recent Labs    05/07/21 1126  TSH 1.142    BMP Recent Labs    05/14/21 0241 05/15/21 0224 05/22/21 1048 05/23/21 1112  NA 139 135 138 138  K 5.5* 5.8* 6.2* 5.7*  CL 112* 109 105 110  CO2 19* 19* 17* 19*  GLUCOSE 101* 101* 93 105*  BUN 75* 79* 58* 60*  CREATININE 7.05* 7.05* 5.78* 6.13*  CALCIUM 8.7* 8.4* 8.3* 8.2*  GFRNONAA 8* 8*  --  10*    HEMOGLOBIN A1C Lab Results  Component Value Date   HGBA1C 4.6 (L) 05/07/2021   MPG 85.32 05/07/2021    IMPRESSION:    ICD-10-CM   1. Chronic combined systolic and diastolic heart failure (HCC)  I50.42 EKG 12-Lead    2. 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 Cardiomyopathy,  unspecified type ([place])  I42.9     4. Hx of influenza  Z87.09        RECOMMENDATIONS: Phillip Lynch is a 56 y.o. 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   Chronic combined systolic and diastolic heart failure (HCC) Euvolemic.  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  Recent echocardiogram concerning for possible cardiac amyloidosis.  Underwent amyloidosis workup and evaluated by [facility] during his recent hospitalization and work-up was negative for TTR amyloidosis. Not on ideal GDMT due to CKD stage 5 and  lack of insurance and finances. Patient has ran out of his carvedilol over the last 1 week.  Shortly will run out of hydralazine and Isordil. I had the patient discussed which medications he will be able to afford at various community hospitals during today's office visit.  The final recommendations for now are to transition isosorbide dinitrate to isosorbide mononitrate as it is cost effective. Hydralazine 25 mg 2 tablets 3 times a day (as 53m tabs are cheaper).  Coreg 25 mg p.o. twice daily. Once the patient is on dialysis would recommend left and right heart catheterization to evaluate for coronaries and hemodynamics respectively. Currently not on ACE inhibitor/ARB/Arni secondary to stage V renal disease. 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 Carvedilol refilled. Hydralazine and isosorbide mononitrate refilled. Will continue to monitor.  Cardiomyopathy, unspecified type ([place] See above  Hx of influenza Treated with Tamiflu during his recent hospitalization  in November 2022.  FINAL MEDICATION LIST END OF ENCOUNTER: No orders of the defined types were placed in this encounter.   Medications Discontinued During This Encounter  Medication Reason   amLODipine (NORVASC) 10 MG tablet Discontinued by provider     Current Outpatient Medications:    benzonatate (TESSALON) 200 MG capsule, Take 200 mg by mouth 3 (three) times daily as needed for cough., Disp: , Rfl:    ferrous sulfate 325 (65 FE) MG tablet, Take 1 tablet (325 mg total) by mouth daily with breakfast., Disp: 30 tablet, Rfl: 0   guaiFENesin (MUCINEX) 600 MG 12 hr tablet, Take 600 mg by mouth 2 (two) times daily., Disp: , Rfl:    sodium bicarbonate 650 MG tablet, Take 650 mg by mouth 2 (two) times daily., Disp: , Rfl:    sodium zirconium cyclosilicate (LOKELMA) 10 g PACK packet, Take 10 g by mouth., Disp: , Rfl:    carvedilol (COREG) 25 MG tablet, Take 1 tablet (25 mg total) by mouth 2 (two) times daily with a meal., Disp: 60 tablet, Rfl: 2   hydrALAZINE (APRESOLINE) 25 MG tablet, Take 2 tablets (50 mg total) by mouth every 8 (eight) hours., Disp: 360 tablet, Rfl: 2   isosorbide mononitrate (IMDUR) 30 MG 24 hr tablet, Take 1 tablet (30 mg total) by mouth daily., Disp: 90 tablet, Rfl: 2  Orders Placed This Encounter  Procedures   EKG 12-Lead    There are no Patient Instructions on file for this visit.   --Continue cardiac medications as reconciled in final medication list. --Return in about 4 weeks (around 07/16/2021) for Follow up, heart failure management.. Or sooner if needed. --Continue follow-up with your primary care physician regarding the management of your other chronic comorbid conditions.  Patient's questions and concerns were addressed to his satisfaction. He voices understanding of the instructions provided during this encounter.   This note was created using a voice recognition software as a result there may be grammatical errors inadvertently enclosed that do not reflect  the nature of this encounter. Every attempt is made to correct such errors.  Total time spent: 66 minutes.  *Total Encounter Time as defined by the Centers for Medicare and Medicaid Services includes, in addition to the face-to-face time of a patient visit (documented in the note above) non-face-to-face time: obtaining and reviewing outside history recent hospitalization records, discharge summary, reviewing diagnostic testing and updating records above, ordering and reviewing medications, tests or  procedures, care coordination (communications with other health care professionals or caregivers), coordination with pharmacy with regards to cost effective medications that he can afford until his insurance is activate,  and documentation in the medical record.   Rex Kras, [place], [facility]  Pager: [phone] Office: [phone]

## 2021-06-28 ENCOUNTER — Other Ambulatory Visit: Payer: Self-pay

## 2021-06-28 ENCOUNTER — Ambulatory Visit (INDEPENDENT_AMBULATORY_CARE_PROVIDER_SITE_OTHER): Payer: Self-pay | Admitting: Physician Assistant

## 2021-06-28 ENCOUNTER — Encounter (HOSPITAL_COMMUNITY): Payer: Self-pay

## 2021-06-28 ENCOUNTER — Encounter: Payer: Self-pay | Admitting: *Deleted

## 2021-06-28 ENCOUNTER — Ambulatory Visit (HOSPITAL_COMMUNITY)
Admission: RE | Admit: 2021-06-28 | Discharge: 2021-06-28 | Disposition: A | Discharge status: Home / Self Care | Payer: Medicaid Other | Source: Ambulatory Visit | Attending: Vascular Surgery | Admitting: Vascular Surgery

## 2021-06-28 VITALS — BP 136/80 | HR 78 | Temp 98.0°F | Resp 20 | Ht 69.0 in | Wt 145.4 lb

## 2021-06-28 DIAGNOSIS — N185 Chronic kidney disease, stage 5: Secondary | ICD-10-CM | POA: Insufficient documentation

## 2021-06-28 NOTE — Progress Notes (Signed)
° ° °  Postoperative Access Visit   History of Present Illness   Phillip Lynch is a 56 y.o. year old male who presents for postoperative follow-up for: left brachiocephalic AV Fistula by Dr. Virl Cagey on 05/13/21.  The patient's wounds are well healed.  The patient notes no steal symptoms.  The patient is able to complete their activities of daily living.  He is not currently on HD. He is seen at [facility] but is unsure of his Nephrologist.  Physical Examination   Vitals:   06/28/21 1434  BP: 136/80  Pulse: 78  Resp: 20  Temp: 98 F (36.7 C)  TempSrc: Temporal  SpO2: 98%  Weight: 145 lb 6.4 oz (66 kg)  Height: 5\' 9"  (1.753 m)   Body mass index is 21.47 kg/m.  left arm Incision is well healed, 2+ radial pulse, hand grip is 5/5, sensation in digits is intact, palpable thrill, bruit can be auscultated    Non invasive vascular lab: Patent left brachial- cephalic AV fistula with no stenosis  Medical Decision Making   Phillip Lynch is a 55 y.o. year old male who presents s/p left brachiocephalic AV Fistula by Dr. Virl Cagey on 05/13/21. Duplex today shows patent fistula without stenosis. Fistula has matured nicely. Clinically with good thrill and easily palpable throughout. Patent is without signs or symptoms of steal syndrome The patient's access will be ready for use after 08/13/20 Advised patient to call [name] to find out when he needs to follow up  The patient may follow up on a prn basis   Karoline Caldwell, PA-C [facility]: [phone]  Clinic MD: Virl Cagey

## 2021-06-28 NOTE — Congregational Nurse Program (Signed)
°  Dept: [facility] Program Note  Date of Encounter: 06/28/2021  Past Medical History: Past Medical History:  Diagnosis Date   Congenital renal agenesis, unilateral    Hypertension    Unintentional weight loss     Encounter Details:  CNP Questionnaire - 06/28/21 1100       Questionnaire   Do you give verbal consent to treat you today? Yes    [facility] Patient S[facility]    Visit Setting Church or Organization    Patient Status Homeless    Insurance [facility]    Insurance Referral N/A    Medication N/A    Medical Provider Yes    Screening Referrals N/A    Medical Referral N/A    Medical Appointment Made N/A    Food N/A    Transportation Provided transportation assistance    Housing/Utilities No permanent housing    Interpersonal Safety N/A    Intervention Support    ED Visit Averted N/A    Life-Saving Intervention Made N/A            Per [facility] CM client is requesting assistance with transportation to dialysis appt today. Went to [place] where client has been provided temporary housing through [facility] and gave 4 bus passes. Client is to let CM/ nurse know when he gets dialysis schedule so we can get help with transportation. Medicaid is currently pending. Deonte Otting W RN CN

## 2021-07-04 ENCOUNTER — Telehealth (HOSPITAL_COMMUNITY): Payer: Self-pay

## 2021-07-04 ENCOUNTER — Ambulatory Visit (HOSPITAL_COMMUNITY): Payer: Self-pay

## 2021-07-04 NOTE — Telephone Encounter (Signed)
Returned pt's call, informed him that we haven't received an order yet for a catheter placement but I will keep checking and call him as soon as we get the order. He was fine with this plan. AW

## 2021-07-05 ENCOUNTER — Other Ambulatory Visit: Payer: Self-pay

## 2021-07-05 DIAGNOSIS — I5020 Unspecified systolic (congestive) heart failure: Secondary | ICD-10-CM

## 2021-07-08 ENCOUNTER — Other Ambulatory Visit (HOSPITAL_COMMUNITY): Payer: Self-pay | Admitting: Nephrology

## 2021-07-08 DIAGNOSIS — N185 Chronic kidney disease, stage 5: Secondary | ICD-10-CM

## 2021-07-11 ENCOUNTER — Other Ambulatory Visit: Payer: Self-pay | Admitting: Radiology

## 2021-07-13 ENCOUNTER — Other Ambulatory Visit: Payer: Self-pay | Admitting: Radiology

## 2021-07-15 ENCOUNTER — Other Ambulatory Visit (HOSPITAL_COMMUNITY): Payer: Self-pay | Admitting: Nephrology

## 2021-07-15 ENCOUNTER — Encounter (HOSPITAL_COMMUNITY): Payer: Self-pay

## 2021-07-15 ENCOUNTER — Other Ambulatory Visit: Payer: Self-pay

## 2021-07-15 ENCOUNTER — Ambulatory Visit (HOSPITAL_COMMUNITY)
Admission: RE | Admit: 2021-07-15 | Discharge: 2021-07-15 | Disposition: A | Discharge status: Home / Self Care | Payer: Medicaid Other | Source: Ambulatory Visit | Attending: Nephrology | Admitting: Nephrology

## 2021-07-15 DIAGNOSIS — Z87891 Personal history of nicotine dependence: Secondary | ICD-10-CM | POA: Insufficient documentation

## 2021-07-15 DIAGNOSIS — Q600 Renal agenesis, unilateral: Secondary | ICD-10-CM | POA: Insufficient documentation

## 2021-07-15 DIAGNOSIS — N185 Chronic kidney disease, stage 5: Secondary | ICD-10-CM

## 2021-07-15 DIAGNOSIS — I120 Hypertensive chronic kidney disease with stage 5 chronic kidney disease or end stage renal disease: Secondary | ICD-10-CM | POA: Insufficient documentation

## 2021-07-15 HISTORY — PX: IR FLUORO GUIDE CV LINE RIGHT: IMG2283

## 2021-07-15 MED ORDER — MIDAZOLAM HCL 2 MG/2ML IJ SOLN
INTRAMUSCULAR | Status: AC | PRN
Start: 2021-07-15 — End: 2021-07-15
  Administered 2021-07-15: 1 mg via INTRAVENOUS

## 2021-07-15 MED ORDER — FENTANYL CITRATE (PF) 100 MCG/2ML IJ SOLN
INTRAMUSCULAR | Status: AC | PRN
  Administered 2021-07-15: 50 ug via INTRAVENOUS

## 2021-07-15 MED ORDER — FENTANYL CITRATE (PF) 100 MCG/2ML IJ SOLN
INTRAMUSCULAR | Status: AC
  Filled 2021-07-15: qty 4

## 2021-07-15 MED ORDER — CEFAZOLIN SODIUM-DEXTROSE 2-4 GM/100ML-% IV SOLN
INTRAVENOUS | Status: AC
  Administered 2021-07-15: 2 g via INTRAVENOUS
  Filled 2021-07-15: qty 100

## 2021-07-15 MED ORDER — LIDOCAINE-EPINEPHRINE 1 %-1:100000 IJ SOLN
INTRAMUSCULAR | Status: AC
  Administered 2021-07-15: 20 mL
  Filled 2021-07-15: qty 1

## 2021-07-15 MED ORDER — LIDOCAINE HCL 1 % IJ SOLN
INTRAMUSCULAR | Status: AC
  Filled 2021-07-15: qty 20

## 2021-07-15 MED ORDER — MIDAZOLAM HCL 2 MG/2ML IJ SOLN
INTRAMUSCULAR | Status: AC
  Filled 2021-07-15: qty 4

## 2021-07-15 MED ORDER — SODIUM CHLORIDE 0.9 % IV SOLN: INTRAVENOUS | Status: DC

## 2021-07-15 MED ORDER — CEFAZOLIN SODIUM-DEXTROSE 2-4 GM/100ML-% IV SOLN: 2.0000 g | INTRAVENOUS | Status: AC

## 2021-07-15 MED ORDER — HEPARIN SODIUM (PORCINE) 1000 UNIT/ML IJ SOLN
INTRAMUSCULAR | Status: AC
  Filled 2021-07-15: qty 10

## 2021-07-15 NOTE — H&P (Signed)
Chief Complaint: Patient was seen in consultation today for tunneled Hemodialysis catheter placement at the request of Stovall,Kathryn R  Referring Physician(s): Loren Racer  Supervising Physician: Sandi Mariscal  Patient Status: [facility] - Out-pt  History of Present Illness: Phillip Lynch is a 56 y.o. male   CKD stage 5-- follows with Dr Carolin Sicks and Dr Posey Pronto Left AV fistula placed in OR Dr Virl Cagey 05/13/21 To be mature by 08/13/21 per pt Pt says Renal MD says labs have changed and dialysis must be initiated --  Scheduled for tunneled dialysis catheter now  Per Nephrology notes:  GFR less than 15 Hypertension; nephrosclerosis Congenital solitary kidney  Scheduled today for tunneled HD cath placement     Past Medical History:  Diagnosis Date   Congenital renal agenesis, unilateral    Hypertension    Unintentional weight loss     Past Surgical History:  Procedure Laterality Date   AV FISTULA PLACEMENT Left 05/13/2021   Procedure: ARTERIOVENOUS (AV) FISTULA CREATION;  Surgeon: Broadus John, MD;  Location: [place];  Service: Vascular;  Laterality: Left;    Allergies: Nsaids  Medications: Prior to Admission medications   Medication Sig Start Date End Date Taking? Authorizing Provider  carvedilol (COREG) 25 MG tablet Take 1 tablet (25 mg total) by mouth 2 (two) times daily with a meal. 06/18/21 09/16/21 Yes Tolia, Sunit, DO  hydrALAZINE (APRESOLINE) 25 MG tablet Take 2 tablets (50 mg total) by mouth every 8 (eight) hours. 06/18/21  Yes Tolia, Sunit, DO  isosorbide mononitrate (IMDUR) 30 MG 24 hr tablet Take 1 tablet (30 mg total) by mouth daily. 06/18/21  Yes Tolia, Sunit, DO  sodium zirconium cyclosilicate (LOKELMA) 10 g PACK packet Take 10 g by mouth daily.   Yes [provider]  sucroferric oxyhydroxide (VELPHORO) 500 MG chewable tablet Chew 500 mg by mouth 3 (three) times daily with meals.   Yes [provider]  ferrous sulfate 325 (65 FE) MG tablet  Take 1 tablet (325 mg total) by mouth daily with breakfast. Patient not taking: Reported on 07/10/2021 07/23/19 07/10/21  Paticia Stack, MD     Family History  Problem Relation Age of Onset   Stroke Mother    Dementia Mother    Kidney disease Mother     Social History   Socioeconomic History   Marital status: Legally Separated    Spouse name: Not on file   Number of children: Not on file   Years of education: Not on file   Highest education level: Not on file  Occupational History   Occupation: Landscaper  Tobacco Use   Smoking status: Former    Types: Cigars    Quit date: 04/2021    Years since quitting: 0.2   Smokeless tobacco: Never  Vaping Use   Vaping Use: Never used  Substance and Sexual Activity   Alcohol use: Not Currently   Drug use: Not Currently    Types: Marijuana   Sexual activity: Yes  Other Topics Concern   Not on file  Social History Narrative   Works as a Development worker, 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 His father passed in March and his mother moved into a nursing facility, and unfortunately he lost ownership of his family's house so has been experiencing housing instability since early summer. Occasionally resides with friend, also gets services from [facility]. Has slept outside as well.    Social Determinants of Health   Financial Resource Strain: High Risk   Difficulty of Paying Living Expenses: Very hard  Food Insecurity: Landscape architect Present   Worried About Charity fundraiser in the Last Year: Often true   Arboriculturist in the Last Year: Often true  Transportation Needs: Public librarian (Medical): Yes   Lack of Transportation (Non-Medical): Yes  Physical Activity: Not on file  Stress: Not on file  Social Connections: Not on file    Review of Systems: A 12 point ROS discussed and pertinent positives are indicated in the HPI above.  All other systems are negative.  Review of Systems  Constitutional:  Negative for activity change,  fatigue and fever.  Respiratory:  Negative for cough and shortness of breath.   Cardiovascular:  Negative for chest pain.  Gastrointestinal:  Negative for abdominal pain.  Neurological:  Negative for weakness.  Psychiatric/Behavioral:  Negative for behavioral problems and confusion.    Vital Signs: BP (!) 179/101 (BP Location: Right Arm)    Pulse 75    Temp 98.5 F (36.9 C) (Oral)    Resp 20    Ht 5\' 8"  (1.727 m)    Wt 145 lb (65.8 kg)    SpO2 100%    BMI 22.05 kg/m   Physical Exam Vitals reviewed.  HENT:     Mouth/Throat:     Mouth: Mucous membranes are moist.  Cardiovascular:     Rate and Rhythm: Normal rate and regular rhythm.     Heart sounds: Normal heart sounds.  Pulmonary:     Effort: Pulmonary effort is normal.     Breath sounds: Normal breath sounds.  Abdominal:     Palpations: Abdomen is soft.  Musculoskeletal:        General: Normal range of motion.  Skin:    General: Skin is warm.  Neurological:     Mental Status: He is alert and oriented to person, place, and time.  Psychiatric:        Behavior: Behavior normal.    Imaging: VAS US DUPLEX DIALYSIS ACCESS (AVF, AVG)  Result Date: 06/28/2021 DIALYSIS ACCESS Patient Name:  Phillip Lynch  Date of Exam:   06/28/2021 Medical Rec #: [id number]      Accession #:    [id number] Date of Birth: [birth year]      Patient Gender: M Patient Age:   72 years Exam [facility] Procedure:      VAS US DUPLEX DIALYSIS ACCESS (AVF, AVG) Referring Phys: JOSHUA ROBINS --------------------------------------------------------------------------------  Reason for Exam: Routine follow up. Access Site: Left Upper Extremity. Access Type: Brachial-cephalic AVF placed 43/15/4008. History: HTN,CKD,. Performing Technologist: Alvia Grove RVT  Examination Guidelines: A complete evaluation includes B-mode imaging, spectral Doppler, color Doppler, and power Doppler as needed of all accessible portions of each vessel. Unilateral  testing is considered an integral part of a complete examination. Limited examinations for reoccurring indications may be performed as noted.  Findings: +--------------------+----------+-----------------+--------+  AVF                  PSV (cm/s) Flow Vol (mL/min) Comments  +--------------------+----------+-----------------+--------+  Native artery inflow    253           2532                  +--------------------+----------+-----------------+--------+  AVF Anastomosis         615                                 +--------------------+----------+-----------------+--------+  +------------+---------+-------------+---------+------------------------------+  OUTFLOW VEIN    PSV    Diameter (cm)   Depth              Describe                            (cm/s)                   (cm)                                    +------------+---------+-------------+---------+------------------------------+  Shoulder        362        0.70                   competing branch 0.30 cm                                                                102cm/s              +------------+---------+-------------+---------+------------------------------+  Prox UA         520        0.55        0.24                                    +------------+---------+-------------+---------+------------------------------+  Mid UA          376        0.51        0.16                                    +------------+---------+-------------+---------+------------------------------+  Dist UA         241        0.52        0.19                                    +------------+---------+-------------+---------+------------------------------+  AC Fossa        520        0.57        0.33                                    +------------+---------+-------------+---------+------------------------------+   Summary: Patent left Brachial-Cephalic AVF with no visualized stenosis.  *See table(s) above for measurements and observations.  Diagnosing physician: Orlie Pollen  Electronically signed by Orlie Pollen on 06/28/2021 at 4:29:45 PM.    --------------------------------------------------------------------------------   Final     Labs:  CBC: Recent Labs    05/13/21 0237 05/14/21 0241 05/15/21 0224 05/23/21 1112  WBC 5.8 6.7 6.8 4.4  HGB 8.9* 8.8* 8.9* 8.3*  HCT 28.1* 28.0* 27.9* 27.2*  PLT 159 178 186 209    COAGS: No results for input(s): INR, APTT in the last 8760 hours.  BMP: Recent Labs    05/13/21 0237 05/14/21 0241 05/15/21 0224 05/22/21  1048 05/23/21 1112  NA 136 139 135 138 138  K 5.5* 5.5* 5.8* 6.2* 5.7*  CL 108 112* 109 105 110  CO2 16* 19* 19* 17* 19*  GLUCOSE 91 101* 101* 93 105*  BUN 82* 75* 79* 58* 60*  CALCIUM 8.3* 8.7* 8.4* 8.3* 8.2*  CREATININE 7.09* 7.05* 7.05* 5.78* 6.13*  GFRNONAA 8* 8* 8*  --  10*    LIVER FUNCTION TESTS: Recent Labs    05/06/21 1655 05/07/21 0146 05/08/21 0240 05/13/21 0237 05/14/21 0241 05/15/21 0224 05/23/21 1112  BILITOT 0.5 0.6  --   --   --   --  0.5  AST 25 22  --   --   --   --  30  ALT 23 20  --   --   --   --  33  ALKPHOS 68 69  --   --   --   --  66  PROT 6.0* 6.1*  --   --   --   --  6.6  ALBUMIN 3.4* 3.2*   < > 3.0* 3.1* 3.0* 3.4*   < > = values in this interval not displayed.    TUMOR MARKERS: No results for input(s): AFPTM, CEA, CA199, CHROMGRNA in the last 8760 hours.  Assessment and Plan:  Hx HTN; nephrosclerosis Solitary kidney at birth Scheduled for tunneled dialysis catheter placement in IR CKD5-- AV fistula placed 11/22---- mature date 2/23 To initiate dialysis this week per Nephrology For tunneled catheter placement to allow for same   Thank you for this interesting consult.  I greatly enjoyed meeting Jaion Lagrange and look forward to participating in their care.  A copy of this report was sent to the requesting provider on this date.  Electronically Signed: Lavonia Drafts, PA-C 07/15/2021, 10:56 AM   I spent a total of  30 Minutes   in face to  face in clinical consultation, greater than 50% of which was counseling/coordinating care for tunneled dialysis catheter placement

## 2021-07-15 NOTE — Progress Notes (Signed)
Pt taught all the dos and donts of dialysis catheter.  Given plastic emergency hemostats. When why and how to use them explained to pt who verbalizes understanding.

## 2021-07-15 NOTE — Progress Notes (Signed)
Pt ambulated without difficulty or bleeding.   Discharged home with April, friend, who will drive and stay with pt x 24 hrs .

## 2021-07-15 NOTE — Procedures (Signed)
Interventional Radiology Procedure Note  Procedure: Tunneled hemodialysis catheter placement  Findings: Please refer to procedural dictation for full description. Right IJ, 19 cm.  Complications: None immediate  Estimated Blood Loss: < 5 mL  Recommendations: Catheter ready for immediate use.  Ruthann Cancer, MD Pager: [phone]

## 2021-07-16 ENCOUNTER — Other Ambulatory Visit (HOSPITAL_COMMUNITY): Payer: Self-pay | Admitting: Nephrology

## 2021-07-16 ENCOUNTER — Encounter (HOSPITAL_COMMUNITY): Payer: Self-pay

## 2021-07-16 ENCOUNTER — Ambulatory Visit: Payer: Self-pay | Admitting: Cardiology

## 2021-07-16 DIAGNOSIS — N185 Chronic kidney disease, stage 5: Secondary | ICD-10-CM

## 2021-07-16 HISTORY — PX: IR US GUIDE VASC ACCESS RIGHT: IMG2390

## 2021-07-18 ENCOUNTER — Telehealth (HOSPITAL_COMMUNITY): Payer: Self-pay | Admitting: Licensed Clinical Social Worker

## 2021-07-18 NOTE — Telephone Encounter (Signed)
CSW received request from [employer], LCSW  inpatient TOC CSW to contact patient who has called multiple times for assistance. CSW contacted patient who states he has resolved his concerns and appreciates the call. He stated that he will start dialysis next week and spoke with a Education officer, museum from the Dialysis center who will follow up with patient next week. No further intervention needed as patient followed by Dialysis CSW. Raquel Sarna, [place], [place]

## 2021-07-29 ENCOUNTER — Encounter: Payer: Self-pay | Admitting: *Deleted

## 2021-07-29 NOTE — Congregational Nurse Program (Signed)
°  Dept: [phone]   Congregational Nurse Program Note  Date of Encounter: 07/29/2021  Past Medical History: Past Medical History:  Diagnosis Date   Congenital renal agenesis, unilateral    Hypertension    Unintentional weight loss     Encounter Details:  CNP Questionnaire - 07/29/21 1349       Questionnaire   Do you give verbal consent to treat you today? Yes    Location Patient Served  [facility]    Visit Setting Phone/Text/Email    Patient Status Homeless    Insurance Uninsured (Orange Card/Care Connects/Self-Pay)    Insurance Referral N/A    Medication N/A    Medical Provider Yes    Screening Referrals N/A    Medical Referral N/A    Medical Appointment Made Other    Food N/A    Transportation Provided transportation assistance    Housing/Utilities No permanent housing    Interpersonal Safety N/A    Intervention Support    ED Visit Averted N/A    Life-Saving Intervention Made N/A            Spoke with client on the phone as transportation has been requested to help client to dialysis Mon, Tues, Thurs and Friday. Dialysis time 10:30 and pick up time 2:15. The dialysis center is out of bus transportation range. Set up [facility] [employer] through 08/05/21. Client is working with a SW pending medicaid. He reports he recently received an orange card and will call writer back with information. Will check on additional benefits with [facility] transportation services.  Letesha Klecker W RN CN

## 2021-08-12 ENCOUNTER — Encounter: Payer: Self-pay | Admitting: *Deleted

## 2021-08-12 NOTE — Congregational Nurse Program (Signed)
°  Dept: [phone]   Congregational Nurse Program Note  Date of Encounter: 08/12/2021  Past Medical History: Past Medical History:  Diagnosis Date   Congenital renal agenesis, unilateral    Hypertension    Unintentional weight loss     Encounter Details:  CNP Questionnaire - 08/12/21 1430       Questionnaire   Do you give verbal consent to treat you today? Yes    Location Patient Served  [facility]    Visit Setting Phone/Text/Email    Patient Status Homeless    Insurance Uninsured (Orange Card/Care Connects/Self-Pay)    Insurance Referral N/A    Medication N/A    Medical Provider Yes    Screening Referrals N/A    Medical Referral N/A    Medical Appointment Made Other    Food N/A    Transportation Provided transportation assistance    Housing/Utilities No permanent housing    Interpersonal Safety N/A    Intervention Support    ED Visit Averted N/A    Life-Saving Intervention Made N/A            Set up transportation with [employer] through March 14th. Transportation is to and from Dialysis center and [employer]. Charisma Charlot W RN CN

## 2021-08-28 ENCOUNTER — Encounter: Payer: Self-pay | Admitting: *Deleted

## 2021-08-28 NOTE — Congregational Nurse Program (Signed)
?  Dept: [phone] ? ? ?Congregational Nurse Program Note ? ?Date of Encounter: 08/28/2021 ? ?Past Medical History: ?Past Medical History:  ?Diagnosis Date  ? Congenital renal agenesis, unilateral   ? Hypertension   ? Unintentional weight loss   ? ? ?Encounter Details: ? CNP Questionnaire - 08/28/21 1000   ? ?  ? Questionnaire  ? Do you give verbal consent to treat you today? Yes   ? Location Patient [place]  ? Visit Setting Phone/Text/Email;Church or Organization   ? Patient Status Homeless   ? Insurance Medicaid   ? Insurance Referral N/A   ? Medication N/A   ? Medical Provider Yes   ? Screening Referrals N/A   ? Medical Referral N/A   ? Medical Appointment Made N/A   ? Food N/A   ? Transportation Provided transportation assistance   ? Housing/Utilities No permanent housing   ? Interpersonal Safety N/A   ? Intervention Support   ? ED Visit Averted N/A   ? Life-Saving Intervention Made N/A   ? ?  ?  ? ?  ?Client seen at [facility] provided by [facility]. Client was recently accepted for medicaid coverage and disability. Explained to client how to call for transportation assistance. Transportation has been scheduled by [employer] through 09/03/21. Received a call from CM at [facility] in the afternoon that client was no longer staying at [facility]. Canceled transportation for tomorrow 3/16. Waiting to hear from client or CM if he needs assistance with transportation from a different location. ?Twila Rappa W RN CN ? ? ? ?

## 2021-10-15 ENCOUNTER — Emergency Department (HOSPITAL_COMMUNITY)
Admission: EM | Admit: 2021-10-15 | Discharge: 2021-10-15 | Disposition: A | Discharge status: Home / Self Care | Payer: Medicare Other | Attending: Emergency Medicine | Admitting: Emergency Medicine

## 2021-10-15 ENCOUNTER — Emergency Department (HOSPITAL_COMMUNITY): Payer: Medicare Other

## 2021-10-15 DIAGNOSIS — N189 Chronic kidney disease, unspecified: Secondary | ICD-10-CM | POA: Insufficient documentation

## 2021-10-15 DIAGNOSIS — I130 Hypertensive heart and chronic kidney disease with heart failure and stage 1 through stage 4 chronic kidney disease, or unspecified chronic kidney disease: Secondary | ICD-10-CM | POA: Insufficient documentation

## 2021-10-15 DIAGNOSIS — R531 Weakness: Secondary | ICD-10-CM | POA: Insufficient documentation

## 2021-10-15 DIAGNOSIS — M545 Low back pain, unspecified: Secondary | ICD-10-CM | POA: Insufficient documentation

## 2021-10-15 DIAGNOSIS — R519 Headache, unspecified: Secondary | ICD-10-CM | POA: Diagnosis not present

## 2021-10-15 DIAGNOSIS — M542 Cervicalgia: Secondary | ICD-10-CM | POA: Insufficient documentation

## 2021-10-15 DIAGNOSIS — I509 Heart failure, unspecified: Secondary | ICD-10-CM | POA: Insufficient documentation

## 2021-10-15 DIAGNOSIS — D631 Anemia in chronic kidney disease: Secondary | ICD-10-CM | POA: Insufficient documentation

## 2021-10-15 DIAGNOSIS — Z79899 Other long term (current) drug therapy: Secondary | ICD-10-CM | POA: Diagnosis not present

## 2021-10-15 DIAGNOSIS — R6883 Chills (without fever): Secondary | ICD-10-CM | POA: Diagnosis not present

## 2021-10-15 LAB — CBC WITH DIFFERENTIAL/PLATELET
Abs Immature Granulocytes: 0.02 10*3/uL (ref 0.00–0.07)
Basophils Absolute: 0 10*3/uL (ref 0.0–0.1)
Basophils Relative: 0 %
Eosinophils Absolute: 0.1 10*3/uL (ref 0.0–0.5)
Eosinophils Relative: 1 %
HCT: 31.1 % — ABNORMAL LOW (ref 39.0–52.0)
Hemoglobin: 9.8 g/dL — ABNORMAL LOW (ref 13.0–17.0)
Immature Granulocytes: 0 %
Lymphocytes Relative: 15 %
Lymphs Abs: 0.9 10*3/uL (ref 0.7–4.0)
MCH: 34.1 pg — ABNORMAL HIGH (ref 26.0–34.0)
MCHC: 31.5 g/dL (ref 30.0–36.0)
MCV: 108.4 fL — ABNORMAL HIGH (ref 80.0–100.0)
Monocytes Absolute: 0.5 10*3/uL (ref 0.1–1.0)
Monocytes Relative: 10 %
Neutro Abs: 4.2 10*3/uL (ref 1.7–7.7)
Neutrophils Relative %: 74 %
Platelets: 169 10*3/uL (ref 150–400)
RBC: 2.87 MIL/uL — ABNORMAL LOW (ref 4.22–5.81)
RDW: 14.3 % (ref 11.5–15.5)
WBC: 5.7 10*3/uL (ref 4.0–10.5)
nRBC: 0 % (ref 0.0–0.2)

## 2021-10-15 LAB — COMPREHENSIVE METABOLIC PANEL
ALT: 11 U/L (ref 0–44)
AST: 16 U/L (ref 15–41)
Albumin: 3.5 g/dL (ref 3.5–5.0)
Alkaline Phosphatase: 96 U/L (ref 38–126)
Anion gap: 10 (ref 5–15)
BUN: 49 mg/dL — ABNORMAL HIGH (ref 6–20)
CO2: 23 mmol/L (ref 22–32)
Calcium: 8.5 mg/dL — ABNORMAL LOW (ref 8.9–10.3)
Chloride: 105 mmol/L (ref 98–111)
Creatinine, Ser: 17.19 mg/dL — ABNORMAL HIGH (ref 0.61–1.24)
GFR, Estimated: 3 mL/min — ABNORMAL LOW (ref >60)
Glucose, Bld: 89 mg/dL (ref 70–99)
Potassium: 4.7 mmol/L (ref 3.5–5.1)
Sodium: 138 mmol/L (ref 135–145)
Total Bilirubin: 0.6 mg/dL (ref 0.3–1.2)
Total Protein: 7.1 g/dL (ref 6.5–8.1)

## 2021-10-15 MED ORDER — CYCLOBENZAPRINE HCL 5 MG PO TABS: 5.0000 mg | ORAL_TABLET | Freq: Two times a day (BID) | ORAL | 0 refills | Status: DC | PRN

## 2021-10-15 NOTE — ED Triage Notes (Signed)
Pt. Stated, ive had left side pain since Saturday that's really bothering me and I have some pain in my neck and Im not sure if its from the cathter in my chest. ?

## 2021-10-15 NOTE — ED Notes (Signed)
ED Provider at bedside. 

## 2021-10-15 NOTE — Discharge Instructions (Signed)
You have been evaluated for your symptoms.  Your low back pain may be due to muscle strain.  Take muscle relaxant as needed for discomfort.  You may also take Tylenol as needed for pain as well.  Your creatinine is high today at 17.  Please discuss this with your kidney specialist for further evaluation.  Return to the ER if your symptoms worsen or if you have any other concern. ?

## 2021-10-15 NOTE — ED Provider Triage Note (Signed)
Emergency Medicine Provider Triage Evaluation Note ? ?Phillip Lynch , a 56 y.o. male  was evaluated in triage.  Pt complains of left-sided back pain.  Reports that this started on Sunday however he thought it was gas and push through the day.  Yesterday he felt "off" and the pain continued.  Says it is worse when he is lying down however okay when he is sitting up.  Most movements also make it worse.  Was supposed to go to dialysis today however called them and told them that he needs to come to the emergency department. ? ?Also reports that he has had some right-sided neck pain ever since his dialysis catheter was placed patient reports solitary kidney since birth however he is unsure which kidney he has ? ?Review of Systems  ?Positive: Back pain ?Negative: No longer makes urine, no bowel dysfunction.  No fevers or chills. ? ?Physical Exam  ?BP (!) 136/98 (BP Location: Right Arm)   Pulse 76   Temp 98.5 ?F (36.9 ?C) (Oral)   Resp 14   Ht 5\' 8"  (1.727 m)   Wt 63.5 kg   SpO2 100%   BMI 21.29 kg/m?  ?Gen:   Awake, no distress   ?Resp:  Normal effort  ?MSK:   Moves extremities without difficulty  ?Other:  Left-sided fistula with palpable thrill.  No midline tenderness however severe tenderness to the paraspinal muscles on the left lumbar spine.  Also positive left CVA tenderness ? ?Medical Decision Making  ?Medically screening exam initiated at 10:40 AM.  Appropriate orders placed.  Phillip Lynch was informed that the remainder of the evaluation will be completed by another provider, this initial triage assessment does not replace that evaluation, and the importance of remaining in the ED until their evaluation is complete. ? ? ? ?  ?Rhae Hammock, PA-C ?10/15/21 1044 ? ?

## 2021-10-15 NOTE — ED Provider Notes (Signed)
?[name] ?Provider Note ? ? ?CSN: [id number] ?Arrival date & time: 10/15/21  0956 ? ?  ? ?History ? ?Chief Complaint  ?Patient presents with  ? left side pain  ? Neck Pain  ? ? ?Phillip Lynch is a 56 y.o. male. ? ?The history is provided by the patient and medical records. No language interpreter was used.  ?Neck Pain ? ? 56 year old male significant history of chronic kidney disease with solitary kidney, uncontrolled hypertension, CHF, anemia, polysubstance use, who presents complaining of back pain.  Patient reports atraumatic pain to his left lower back ongoing for the past 3 days.  Pain is described as a dull achy sensation sometimes with sharp with certain movement.  Pain is nonradiating, moderate in severity.  He also endorsed slight chills, weakness, and a headache yesterday.  Does not endorse any fever chest pain shortness of breath productive cough or abdominal pain.  Patient does not make urine.  He denies any nausea or vomiting.  He has been compliant with his dialysis on Tuesday Thursday and Saturday.  He normally stay active and ride a mountain bikes on a regular basis but denies any recent injury or increased activity.  No history of kidney stone. ?Patient also has a right Port-A-Cath that was placed in January and since then he occasionally will experience some discomfort to the back of his neck on the right side.  No significant complaint of neck pain at this time.  Patient is currently on the kidney transplant list at [facility] health. ? ? ? ? ? ?Home Medications ?Prior to Admission medications   ?Medication Sig Start Date End Date Taking? Authorizing Provider  ?carvedilol (COREG) 25 MG tablet Take 1 tablet (25 mg total) by mouth 2 (two) times daily with a meal. 06/18/21 09/16/21  Tolia, Sunit, DO  ?ferrous sulfate 325 (65 FE) MG tablet Take 1 tablet (325 mg total) by mouth daily with breakfast. ?Patient not taking: Reported on 07/10/2021 07/23/19 07/10/21   Paticia Stack, MD  ?hydrALAZINE (APRESOLINE) 25 MG tablet Take 2 tablets (50 mg total) by mouth every 8 (eight) hours. 06/18/21   Tolia, Sunit, DO  ?isosorbide mononitrate (IMDUR) 30 MG 24 hr tablet Take 1 tablet (30 mg total) by mouth daily. 06/18/21   Tolia, Sunit, DO  ?sodium zirconium cyclosilicate (LOKELMA) 10 g PACK packet Take 10 g by mouth daily.    [provider]  ?sucroferric oxyhydroxide (VELPHORO) 500 MG chewable tablet Chew 500 mg by mouth 3 (three) times daily with meals.    [provider]  ?   ? ?Allergies    ?Nsaids   ? ?Review of Systems   ?Review of Systems  ?Musculoskeletal:  Positive for neck pain.  ?All other systems reviewed and are negative. ? ?Physical Exam ?Updated Vital Signs ?BP (!) 144/93   Pulse 70   Temp 98 ?F (36.7 ?C) (Oral)   Resp 18   Ht 5\' 8"  (1.727 m)   Wt 63.5 kg   SpO2 100%   BMI 21.29 kg/m?  ?Physical Exam ?Vitals and nursing note reviewed.  ?Constitutional:   ?   General: He is not in acute distress. ?   Appearance: He is well-developed.  ?HENT:  ?   Head: Atraumatic.  ?Eyes:  ?   Conjunctiva/sclera: Conjunctivae normal.  ?Cardiovascular:  ?   Rate and Rhythm: Normal rate and regular rhythm.  ?   Pulses: Normal pulses.  ?   Heart sounds:  Normal heart sounds.  ?Pulmonary:  ?   Effort: Pulmonary effort is normal.  ?   Breath sounds: Normal breath sounds. No wheezing, rhonchi or rales.  ?Chest:  ?   Chest wall: No tenderness (Port-A-Cath site on the right side of chest without any signs of infection or tenderness noted.).  ?Abdominal:  ?   Palpations: Abdomen is soft.  ?   Tenderness: There is no abdominal tenderness.  ?Musculoskeletal:     ?   General: Tenderness (Tenderness to palpation of left lumbar paraspinal muscle without any overlying skin changes.  No CVA tenderness.  No significant midline spine tenderness.) present.  ?   Cervical back: Neck supple.  ?   Right lower leg: No edema.  ?   Left lower leg: No edema.  ?Skin: ?   Findings: No rash.   ?Neurological:  ?   Mental Status: He is alert and oriented to person, place, and time.  ? ? ?ED Results / Procedures / Treatments   ?Labs ?(all labs ordered are listed, but only abnormal results are displayed) ?Labs Reviewed  ?COMPREHENSIVE METABOLIC PANEL - Abnormal; Notable for the following components:  ?    Result Value  ? BUN 49 (*)   ? Creatinine, Ser 17.19 (*)   ? Calcium 8.5 (*)   ? GFR, Estimated 3 (*)   ? All other components within normal limits  ?CBC WITH DIFFERENTIAL/PLATELET - Abnormal; Notable for the following components:  ? RBC 2.87 (*)   ? Hemoglobin 9.8 (*)   ? HCT 31.1 (*)   ? MCV 108.4 (*)   ? MCH 34.1 (*)   ? All other components within normal limits  ? ? ?EKG ?None ? ?Radiology ?CT Renal Stone Study ? ?Result Date: 10/15/2021 ?CLINICAL DATA:  Flank pain, kidney stone suspected Renal ischemia or infarction EXAM: CT ABDOMEN AND PELVIS WITHOUT CONTRAST TECHNIQUE: Multidetector CT imaging of the abdomen and pelvis was performed following the standard protocol without IV contrast. RADIATION DOSE REDUCTION: This exam was performed according to the departmental dose-optimization program which includes automated exposure control, adjustment of the mA and/or kV according to patient size and/or use of iterative reconstruction technique. COMPARISON:  None Available. FINDINGS: Lower chest: No acute abnormality. Hepatobiliary: No focal liver abnormality is seen. No gallstones, gallbladder wall thickening, or biliary dilatation. Pancreas: Unremarkable. Spleen: Unremarkable. Adrenals/Urinary Tract: Adrenals are unremarkable. Right kidney is absent. No left renal calculi or hydronephrosis. Circumferential bladder wall thickening. Stomach/Bowel: Stomach is within normal limits. Bowel is normal in caliber. Question wall thickening along the proximal descending colon with mild infiltration of adjacent fat noted. Moderate stool throughout the colon. Normal appendix. Vascular/Lymphatic: No significant vascular  abnormality on this noncontrast study. Reproductive: Unremarkable. Other: Abdominal wall is unremarkable. Musculoskeletal: No acute osseous abnormality. IMPRESSION: No urinary tract calculi or hydronephrosis. Circumferential bladder wall thickening is probably due to under distension. Correlate for cystitis. Question of wall thickening along the proximal descending colon, which could be due to under distension. However, there is a mild infiltration of the adjacent fat raising possibility of colitis. Electronically Signed   By: Macy Mis M.D.   On: 10/15/2021 16:34   ? ?Procedures ?Procedures  ? ? ?Medications Ordered in ED ?Medications - No data to display ? ?ED Course/ Medical Decision Making/ A&P ?  ?                        ?Medical Decision Making ?Amount and/or Complexity of Data  Reviewed ?Radiology: ordered. ? ? ?BP (!) 153/88   Pulse 70   Temp 98 ?F (36.7 ?C) (Oral)   Resp 16   Ht 5\' 8"  (1.727 m)   Wt 63.5 kg   SpO2 100%   BMI 21.29 kg/m?  ? ?3:34 PM ?This is a 56 year old male with history of left solitary kidney, currently undergoing TTS dialysis who has been compliant with his dialysis but is presenting here with complaints of pain to his left lower back for the past 3 days.  Pain is reproducible on exam seems to be worsened with movement and suggestive of MSK pain.  Patient states fairly active riding mountain bike and ambulating regularly.  On appearance he is well-appearing in no acute discomfort.  Labs obtained independently viewed interpreted by me.  Labs remarkable for worsening creatinine of 17.19, it was 6.13 approximately 4 months ago.  Patient does not make urine.  He is afebrile, no hypoxia.  He is mildly hypertensive with a blood pressure 153/88.  Work-up initiated. ? ?4:56 PM ?Labs and imaging independently reviewed inter by me.  Labs remarkable with an elevated creatinine as mentioned above but the remainder of his labs are reassuring.  A CT is renal stone study obtained without  any evidence of kidney stone.  There is circumferential bladder wall thickening, probably due to underdistention.  Patient does not make urine therefore have low suspicion for cystitis.  There is a mild infiltration of adjac

## 2021-11-05 ENCOUNTER — Other Ambulatory Visit (HOSPITAL_COMMUNITY): Payer: Self-pay | Admitting: Nephrology

## 2021-11-05 DIAGNOSIS — I509 Heart failure, unspecified: Secondary | ICD-10-CM

## 2021-11-29 ENCOUNTER — Ambulatory Visit (HOSPITAL_COMMUNITY): Payer: Medicare Other | Attending: Cardiology

## 2021-11-29 DIAGNOSIS — I5020 Unspecified systolic (congestive) heart failure: Secondary | ICD-10-CM

## 2021-11-29 DIAGNOSIS — I509 Heart failure, unspecified: Secondary | ICD-10-CM | POA: Insufficient documentation

## 2021-11-29 LAB — ECHOCARDIOGRAM COMPLETE
Area-P 1/2: 2.87 cm2
Calc EF: 41.9 %
S' Lateral: 3.8 cm
Single Plane A2C EF: 38.2 %
Single Plane A4C EF: 44.3 %

## 2021-12-07 ENCOUNTER — Emergency Department (HOSPITAL_COMMUNITY): Payer: Medicare Other

## 2021-12-07 ENCOUNTER — Encounter (HOSPITAL_COMMUNITY): Payer: Self-pay | Admitting: Emergency Medicine

## 2021-12-07 ENCOUNTER — Emergency Department (HOSPITAL_COMMUNITY)
Admission: EM | Admit: 2021-12-07 | Discharge: 2021-12-07 | Disposition: A | Discharge status: Home / Self Care | Payer: Medicare Other | Attending: Emergency Medicine | Admitting: Emergency Medicine

## 2021-12-07 ENCOUNTER — Other Ambulatory Visit: Payer: Self-pay

## 2021-12-07 DIAGNOSIS — E860 Dehydration: Secondary | ICD-10-CM | POA: Diagnosis not present

## 2021-12-07 DIAGNOSIS — R000 Tachycardia, unspecified: Secondary | ICD-10-CM | POA: Diagnosis not present

## 2021-12-07 DIAGNOSIS — R0600 Dyspnea, unspecified: Secondary | ICD-10-CM | POA: Diagnosis not present

## 2021-12-07 DIAGNOSIS — R748 Abnormal levels of other serum enzymes: Secondary | ICD-10-CM | POA: Diagnosis not present

## 2021-12-07 DIAGNOSIS — R531 Weakness: Secondary | ICD-10-CM | POA: Diagnosis present

## 2021-12-07 LAB — CBC WITH DIFFERENTIAL/PLATELET
Abs Immature Granulocytes: 0.02 10*3/uL (ref 0.00–0.07)
Basophils Absolute: 0 10*3/uL (ref 0.0–0.1)
Basophils Relative: 0 %
Eosinophils Absolute: 0 10*3/uL (ref 0.0–0.5)
Eosinophils Relative: 0 %
HCT: 34.5 % — ABNORMAL LOW (ref 39.0–52.0)
Hemoglobin: 11.3 g/dL — ABNORMAL LOW (ref 13.0–17.0)
Immature Granulocytes: 0 %
Lymphocytes Relative: 21 %
Lymphs Abs: 1 10*3/uL (ref 0.7–4.0)
MCH: 33.4 pg (ref 26.0–34.0)
MCHC: 32.8 g/dL (ref 30.0–36.0)
MCV: 102.1 fL — ABNORMAL HIGH (ref 80.0–100.0)
Monocytes Absolute: 0.6 10*3/uL (ref 0.1–1.0)
Monocytes Relative: 12 %
Neutro Abs: 3.3 10*3/uL (ref 1.7–7.7)
Neutrophils Relative %: 67 %
Platelets: 185 10*3/uL (ref 150–400)
RBC: 3.38 MIL/uL — ABNORMAL LOW (ref 4.22–5.81)
RDW: 15 % (ref 11.5–15.5)
WBC: 5 10*3/uL (ref 4.0–10.5)
nRBC: 0 % (ref 0.0–0.2)

## 2021-12-07 LAB — COMPREHENSIVE METABOLIC PANEL
ALT: 14 U/L (ref 0–44)
AST: 31 U/L (ref 15–41)
Albumin: 4.1 g/dL (ref 3.5–5.0)
Alkaline Phosphatase: 86 U/L (ref 38–126)
Anion gap: 21 — ABNORMAL HIGH (ref 5–15)
BUN: 72 mg/dL — ABNORMAL HIGH (ref 6–20)
CO2: 27 mmol/L (ref 22–32)
Calcium: 9.8 mg/dL (ref 8.9–10.3)
Chloride: 93 mmol/L — ABNORMAL LOW (ref 98–111)
Creatinine, Ser: 14.44 mg/dL — ABNORMAL HIGH (ref 0.61–1.24)
GFR, Estimated: 4 mL/min — ABNORMAL LOW (ref >60)
Glucose, Bld: 71 mg/dL (ref 70–99)
Potassium: 4.5 mmol/L (ref 3.5–5.1)
Sodium: 141 mmol/L (ref 135–145)
Total Bilirubin: 0.7 mg/dL (ref 0.3–1.2)
Total Protein: 8.4 g/dL — ABNORMAL HIGH (ref 6.5–8.1)

## 2021-12-07 LAB — TROPONIN I (HIGH SENSITIVITY)
Troponin I (High Sensitivity): 18 ng/L — ABNORMAL HIGH (ref <18)
Troponin I (High Sensitivity): 19 ng/L — ABNORMAL HIGH (ref <18)

## 2021-12-07 LAB — CBG MONITORING, ED: Glucose-Capillary: 75 mg/dL (ref 70–99)

## 2021-12-07 MED ORDER — PATIROMER SORBITEX CALCIUM 8.4 G PO PACK: 8.4000 g | PACK | Freq: Every day | ORAL | Status: DC

## 2021-12-07 MED ORDER — SODIUM CHLORIDE 0.9 % IV BOLUS
250.0000 mL | Freq: Once | INTRAVENOUS | Status: AC
  Administered 2021-12-07: 250 mL via INTRAVENOUS

## 2021-12-07 MED ORDER — PATIROMER SORBITEX CALCIUM 8.4 G PO PACK
8.4000 g | PACK | Freq: Once | ORAL | Status: AC
Start: 2021-12-07 — End: 2021-12-07
  Administered 2021-12-07: 8.4 g via ORAL
  Filled 2021-12-07: qty 1

## 2021-12-07 MED ORDER — VELTASSA 8.4 G PO PACK: 8.4000 g | PACK | Freq: Every day | ORAL | 0 refills | Status: DC

## 2021-12-07 NOTE — ED Notes (Signed)
Pt verbalizes understanding of discharge instructions. Opportunity for questions and answers were provided. Pt discharged from the ED.   ?

## 2021-12-13 ENCOUNTER — Ambulatory Visit: Payer: Medicare Other | Admitting: Cardiology

## 2021-12-25 ENCOUNTER — Ambulatory Visit: Payer: Medicare Other | Admitting: Cardiology

## 2021-12-25 ENCOUNTER — Encounter: Payer: Self-pay | Admitting: Cardiology

## 2021-12-25 VITALS — BP 132/91 | HR 105 | Temp 98.0°F | Resp 16 | Ht 68.0 in | Wt 141.0 lb

## 2021-12-25 DIAGNOSIS — N186 End stage renal disease: Secondary | ICD-10-CM

## 2021-12-25 DIAGNOSIS — I5042 Chronic combined systolic (congestive) and diastolic (congestive) heart failure: Secondary | ICD-10-CM

## 2021-12-25 DIAGNOSIS — Z8709 Personal history of other diseases of the respiratory system: Secondary | ICD-10-CM

## 2021-12-25 DIAGNOSIS — I130 Hypertensive heart and chronic kidney disease with heart failure and stage 1 through stage 4 chronic kidney disease, or unspecified chronic kidney disease: Secondary | ICD-10-CM

## 2021-12-25 DIAGNOSIS — I429 Cardiomyopathy, unspecified: Secondary | ICD-10-CM

## 2021-12-25 MED ORDER — IVABRADINE HCL 5 MG PO TABS: 5.0000 mg | ORAL_TABLET | Freq: Two times a day (BID) | ORAL | 0 refills | Status: DC

## 2021-12-25 MED ORDER — ROSUVASTATIN CALCIUM 5 MG PO TABS: 5.0000 mg | ORAL_TABLET | Freq: Every evening | ORAL | 0 refills | Status: DC

## 2021-12-25 MED ORDER — ASPIRIN 81 MG PO TBEC: 81.0000 mg | DELAYED_RELEASE_TABLET | Freq: Every day | ORAL | 0 refills | Status: DC

## 2021-12-25 NOTE — Progress Notes (Signed)
Date:  12/25/2021   ID:  Phillip Lynch, DOB 03/07/1966, MRN [id number]  PCP:  Patient, [name]  Cardiologist:  Rex Kras, DO, [facility] (established care 05/07/2021) Advanced heart failure specialist: Dr. Haroldine Laws  Date: 12/25/21 Last Office Visit: 06/18/2021  Chief Complaint  Patient presents with   Follow-up    Heart Failure.    Cardiomyopathy    HPI  Phillip Lynch is a 56 y.o. African-American male whose past medical history and cardiovascular risk factors include: Chronic combined systolic and diastolic heart failure, stage C, NYHA class II, influenza A infection, history of solitary kidney, hypertension with end-stage renal disease on hemodialysis, history of polysubstance abuse.   Establish care during his hospitalization in November 2022 for heart failure.  He was diagnosed with influenza A infection after having cold/flulike symptoms.  Underwent echocardiogram to establish a baseline and was noted to have severely reduced LVEF with concerns for possible cardiac amyloidosis.  Cardiology was consulted and he was started on medical therapy which was limited given his chronic kidney disease stage V.  Was also evaluated advanced heart failure team underlying PYP scan.  His underlying cardiomyopathy likely secondary to uncontrolled hypertension and not amyloid.   He now presents for 61-month follow-up visit.   Since last office visit patient has been started on hemodialysis due to [place] Saturdays.  He was having episodes of hypotension including falls and therefore hydralazine and isosorbide have been discontinued.  Carvedilol has been reduced to 12.5 mg p.o. twice daily.  He does not take carvedilol prior to his dialysis sessions.  Clinically he is doing well denies anginal discomfort.  And he is able to ride his bike to and from dialysis.  Unfortunately, he still remains homeless, getting an apartment later in the next month.  Patient has received Medicaid and will be getting  disability income likely next month as well.  Patient states that he still struggling to maintain sleep with regards to healthy meals, shelter, and affording his medications.  Still remains to have a positive outlook and is in good spirits  FUNCTIONAL STATUS: No structured exercise program or daily routine.   ALLERGIES: Allergies  Allergen Reactions   Nsaids Other (See Comments)    Patient was born with only 1 kidney and was told to not take NSAID(s)    MEDICATION LIST PRIOR TO VISIT: Current Meds  Medication Sig   aspirin EC 81 MG tablet Take 1 tablet (81 mg total) by mouth daily. Swallow whole.   B Complex-C-Zn-Folic Acid (DIALYVITE/ZINC) TABS Take 1 tablet by mouth daily.   carvedilol (COREG) 25 MG tablet Take 1 tablet (25 mg total) by mouth 2 (two) times daily with a meal. (Patient taking differently: Take 12.5 mg by mouth daily.)   ferrous sulfate 325 (65 FE) MG tablet Take 1 tablet (325 mg total) by mouth daily with breakfast.   ivabradine (CORLANOR) 5 MG TABS tablet Take 1 tablet (5 mg total) by mouth 2 (two) times daily with a meal.   rosuvastatin (CRESTOR) 5 MG tablet Take 1 tablet (5 mg total) by mouth at bedtime.   SENSIPAR 30 MG tablet Take 30 mg by mouth once a week.     PAST MEDICAL HISTORY: Past Medical History:  Diagnosis Date   CHF (congestive heart failure) (HCC)    Congenital renal agenesis, unilateral    Hypertension    Unintentional weight loss     PAST SURGICAL HISTORY: Past Surgical History:  Procedure Laterality Date   AV FISTULA PLACEMENT  Left 05/13/2021   Procedure: ARTERIOVENOUS (AV) FISTULA CREATION;  Surgeon: Broadus John, MD;  Location: [facility] OR;  Service: Vascular;  Laterality: Left;   IR FLUORO GUIDE CV LINE RIGHT  07/15/2021   IR US GUIDE VASC ACCESS RIGHT  07/16/2021    FAMILY HISTORY: The patient family history includes Dementia in his mother; Kidney disease in his mother; Stroke in his mother.  SOCIAL HISTORY:  The patient  reports that  he quit smoking about 8 months ago. His smoking use included cigars. He has never used smokeless tobacco. He reports current alcohol use. He reports current drug use. Drug: Marijuana.  REVIEW OF SYSTEMS: Review of Systems  Cardiovascular:  Negative for chest pain, dyspnea on exertion, leg swelling, near-syncope, orthopnea, palpitations, paroxysmal nocturnal dyspnea and syncope.  Respiratory:  Negative for shortness of breath.     PHYSICAL EXAM:    12/25/2021    9:35 AM 12/07/2021    4:00 PM 12/07/2021    3:45 PM  Vitals with BMI  Height 5\' 8"     Weight 141 lbs    BMI 99.37    Systolic 169 678 938  Diastolic 91 82 94  Pulse 101 85 83    CONSTITUTIONAL: Well-developed and well-nourished. No acute distress.  SKIN: Skin is warm and dry. No rash noted. No cyanosis. No pallor. No jaundice HEAD: Normocephalic and atraumatic.  EYES: No scleral icterus.  Arcus senilis MOUTH/THROAT: Moist oral membranes.  NECK: No JVD present. No thyromegaly noted. No carotid bruits  CHEST Normal respiratory effort. No intercostal retractions.  Dialysis catheter right anterior chest wall LUNGS: Clear to auscultation bilaterally.  No stridor. No wheezes. No rales.  CARDIOVASCULAR: Regular rate and rhythm, positive S1-S2, no murmurs rubs or gallops appreciated. ABDOMINAL: Soft, nontender, nondistended, positive bowel sounds in all 4 quadrants, no apparent ascites.  EXTREMITIES: No peripheral edema, warm to touch, 2+ DP and PT pulses bilaterally. HEMATOLOGIC: No significant bruising NEUROLOGIC: Oriented to person, place, and time. Nonfocal. Normal muscle tone.  PSYCHIATRIC: Normal mood and affect. Normal behavior. Cooperative  CARDIAC DATABASE: EKG: 06/18/2021: Sinus tachycardia, 104 bpm, normal axis, without underlying ischemia or injury pattern.  12/25/2021: Ectopic atrial rhythm, 99bpm, without underlying injury pattern.   Echocardiogram: 05/07/2021:  1. Left ventricular ejection fraction, by estimation,  is 20 to 25%. The  left ventricle has severely decreased function. The left ventricle  demonstrates global hypokinesis. The left ventricular internal cavity size  was mildly dilated. There is moderate  left ventricular hypertrophy. Left ventricular diastolic parameters are  consistent with Grade I diastolic dysfunction (impaired relaxation). The  average left ventricular global longitudinal strain is -9.0 %. The global  longitudinal strain is abnormal.  Strain is relatively preserved at the apical cap. Bright myocardium with  LVH and relatively preserved apical strain suggests the possibility of  cardiac amyloidosis.   2. Right ventricular systolic function is normal. The right ventricular  size is normal. There is normal pulmonary artery systolic pressure. The  estimated right ventricular systolic pressure is 75.1 mmHg.   3. The mitral valve is normal in structure. Trivial mitral valve  regurgitation. No evidence of mitral stenosis.   4. The aortic valve is tricuspid. Aortic valve regurgitation is mild. No  aortic stenosis is present.   5. Aortic dilatation noted. 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   6. The inferior vena cava is normal in size with greater than 50%  respiratory variability, suggesting right atrial pressure of 3  mmHg.    Stress Testing: No results found for this or any previous visit from the past 1095 days.  Heart Catheterization: None  PYP Study  05/10/2021: Visual and quantitative assessment (grade 1, H/CLL equal 1.22) are equivocally suggestive of transthyretin amyloidosis.  LABORATORY DATA:    Latest Ref Rng & Units 12/07/2021   12:37 PM 10/15/2021   10:44 AM 05/23/2021   11:12 AM  CBC  WBC 4.0 - 10.5 K/uL 5.0  5.7  4.4   Hemoglobin 13.0 - 17.0 g/dL 11.3  9.8  8.3   Hematocrit 39.0 - 52.0 % 34.5  31.1  27.2   Platelets 150 - 400 K/uL 185  169  209        Latest Ref Rng & Units 12/07/2021   12:37 PM 10/15/2021   10:44 AM  05/23/2021   11:12 AM  CMP  Glucose 70 - 99 mg/dL 71  89  105   BUN 6 - 20 mg/dL 72  49  60   Creatinine 0.61 - 1.24 mg/dL 14.44  17.19  6.13   Sodium 135 - 145 mmol/L 141  138  138   Potassium 3.5 - 5.1 mmol/L 4.5  4.7  5.7   Chloride 98 - 111 mmol/L 93  105  110   CO2 22 - 32 mmol/L 27  23  19    Calcium 8.9 - 10.3 mg/dL 9.8  8.5  8.2   Total Protein 6.5 - 8.1 g/dL 8.4  7.1  6.6   Total Bilirubin 0.3 - 1.2 mg/dL 0.7  0.6  0.5   Alkaline Phos 38 - 126 U/L 86  96  66   AST 15 - 41 U/L 31  16  30    ALT 0 - 44 U/L 14  11  33     Lipid Panel     Component Value Date/Time   CHOL 151 05/07/2021 1644   TRIG 135 05/07/2021 1644   HDL 66 05/07/2021 1644   CHOLHDL 2.3 05/07/2021 1644   VLDL 27 05/07/2021 1644   LDLCALC 58 05/07/2021 1644    No components found for: "NTPROBNP" No results for input(s): "PROBNP" in the last 8760 hours. Recent Labs    05/07/21 1126  TSH 1.142    BMP Recent Labs    05/23/21 1112 10/15/21 1044 12/07/21 1237  NA 138 138 141  K 5.7* 4.7 4.5  CL 110 105 93*  CO2 19* 23 27  GLUCOSE 105* 89 71  BUN 60* 49* 72*  CREATININE 6.13* 17.19* 14.44*  CALCIUM 8.2* 8.5* 9.8  GFRNONAA 10* 3* 4*    HEMOGLOBIN A1C Lab Results  Component Value Date   HGBA1C 4.6 (L) 05/07/2021   MPG 85.32 05/07/2021    IMPRESSION:    ICD-10-CM   1. Chronic combined systolic and diastolic heart failure (HCC)  I50.42 EKG 12-Lead    ivabradine (CORLANOR) 5 MG TABS tablet    aspirin EC 81 MG tablet    rosuvastatin (CRESTOR) 5 MG tablet    2. Cardiomyopathy, unspecified type ([place])  I42.9 EKG 12-Lead    ivabradine (CORLANOR) 5 MG TABS tablet    3. 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 ESRD (end stage renal disease) on dialysis ([place])  N18.6    Z99.2     5. Hx of influenza  Z87.09        RECOMMENDATIONS: Phillip Lynch is a 56 y.o. African-American male whose past medical history  and cardiac risk factors include: Chronic  combined systolic and diastolic heart failure, stage C, NYHA class II, influenza A infection, history of solitary kidney, hypertension with end-stage renal disease on hemodialysis, history of polysubstance abuse.   Chronic combined systolic and diastolic heart failure (HCC) Euvolemic. Stage C, NYHA class II. Underlying cardiomyopathy likely due to hypertensive heart disease. During his hospitalization he underwent TTR amyloidosis work-up which was negative. Currently on carvedilol 12.5 mg p.o. twice daily. Start Corlanor 5 mg p.o. twice daily.   We will apply for patient assistance as patient is currently homeless and would benefit from patient assistance.  Since last visit his hydralazine and isosorbide medications were discontinued due to symptomatic hypotension after dialysis treatments.  He goes to dialysis Tuesday Thursdays and Saturdays. We will uptitrate GDMT as hemodynamics allow. We discussed undergoing undergoing left and right heart catheterization given his underlying cardiomyopathy.  For now he would like to wait. Will discuss at the next visit.  Start aspirin 81 mg p.o. daily. Start Crestor 5 mg p.o. 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 Holding off on up titration of GDMT due to episodes of hypotension after dialysis. For now initiated Corlanor as discussed above. Patient is asked to hold off his medications prior to dialysis sessions to prevent hypotension/falls.  FINAL MEDICATION LIST END OF ENCOUNTER: Meds ordered this encounter  Medications   ivabradine (CORLANOR) 5 MG TABS tablet    Sig: Take 1 tablet (5 mg total) by mouth 2 (two) times daily with a meal.    Dispense:  180 tablet    Refill:  0   aspirin EC 81 MG tablet    Sig: Take 1 tablet (81 mg total) by mouth daily. Swallow whole.    Dispense:  90 tablet    Refill:  0    rosuvastatin (CRESTOR) 5 MG tablet    Sig: Take 1 tablet (5 mg total) by mouth at bedtime.    Dispense:  90 tablet    Refill:  0    Medications Discontinued During This Encounter  Medication Reason   cyclobenzaprine (FLEXERIL) 5 MG tablet    hydrALAZINE (APRESOLINE) 25 MG tablet    isosorbide mononitrate (IMDUR) 30 MG 24 hr tablet    patiromer (VELTASSA) 8.4 g packet    sucroferric oxyhydroxide (VELPHORO) 500 MG chewable tablet      Current Outpatient Medications:    aspirin EC 81 MG tablet, Take 1 tablet (81 mg total) by mouth daily. Swallow whole., Disp: 90 tablet, Rfl: 0   B Complex-C-Zn-Folic Acid (DIALYVITE/ZINC) TABS, Take 1 tablet by mouth daily., Disp: , Rfl:    carvedilol (COREG) 25 MG tablet, Take 1 tablet (25 mg total) by mouth 2 (two) times daily with a meal. (Patient taking differently: Take 12.5 mg by mouth daily.), Disp: 60 tablet, Rfl: 2   ferrous sulfate 325 (65 FE) MG tablet, Take 1 tablet (325 mg total) by mouth daily with breakfast., Disp: 30 tablet, Rfl: 0   ivabradine (CORLANOR) 5 MG TABS tablet, Take 1 tablet (5 mg total) by mouth 2 (two) times daily with a meal., Disp: 180 tablet, Rfl: 0   rosuvastatin (CRESTOR) 5 MG tablet, Take 1 tablet (5 mg total) by mouth at bedtime., Disp: 90 tablet, Rfl: 0   SENSIPAR 30 MG tablet, Take 30 mg by mouth once a week., Disp: , Rfl:   Orders Placed This  Encounter  Procedures   EKG 12-Lead    There are no Patient Instructions on file for this visit.   --Continue cardiac medications as reconciled in final medication list. --Return in about 4 weeks (around 01/22/2022) for Follow up, heart failure management.. Or sooner if needed. --Continue follow-up with your primary care physician regarding the management of your other chronic comorbid conditions.  Patient's questions and concerns were addressed to his satisfaction. He voices understanding of the instructions provided during this encounter.   This note was created using a  voice recognition software as a result there may be grammatical errors inadvertently enclosed that do not reflect the nature of this encounter. Every attempt is made to correct such errors.  Rex Kras, [place], [facility]  Pager: [phone] Office: [phone]

## 2022-01-31 ENCOUNTER — Ambulatory Visit: Payer: Medicare Other | Admitting: Cardiology

## 2022-01-31 ENCOUNTER — Encounter: Payer: Self-pay | Admitting: Cardiology

## 2022-01-31 VITALS — BP 133/81 | HR 111 | Temp 97.5°F | Resp 16 | Ht 68.0 in | Wt 144.5 lb

## 2022-01-31 DIAGNOSIS — N186 End stage renal disease: Secondary | ICD-10-CM

## 2022-01-31 DIAGNOSIS — I130 Hypertensive heart and chronic kidney disease with heart failure and stage 1 through stage 4 chronic kidney disease, or unspecified chronic kidney disease: Secondary | ICD-10-CM

## 2022-01-31 DIAGNOSIS — I5042 Chronic combined systolic (congestive) and diastolic (congestive) heart failure: Secondary | ICD-10-CM

## 2022-01-31 DIAGNOSIS — I429 Cardiomyopathy, unspecified: Secondary | ICD-10-CM

## 2022-01-31 MED ORDER — IVABRADINE HCL 5 MG PO TABS: 5.0000 mg | ORAL_TABLET | Freq: Two times a day (BID) | ORAL | 0 refills | Status: DC

## 2022-01-31 MED ORDER — ROSUVASTATIN CALCIUM 5 MG PO TABS: 5.0000 mg | ORAL_TABLET | Freq: Every evening | ORAL | 0 refills | Status: DC

## 2022-01-31 NOTE — Progress Notes (Signed)
Date:  01/31/2022   ID:  Phillip Lynch, DOB July 31, 1965, MRN [id number]  PCP:  Patient, [name]  Cardiologist:  Rex Kras, DO, [facility] (established care 05/07/2021) Advanced heart failure specialist: Dr. Haroldine Laws  Date: 01/31/22 Last Office Visit: 12/25/2021  Chief Complaint  Patient presents with   Heart Failure management   Follow-up    4 weeks    HPI  Phillip Lynch is a 56 y.o. African-American male whose past medical history and cardiovascular risk factors include: Chronic combined systolic and diastolic heart failure, stage C, NYHA class II, influenza A infection, history of solitary kidney, hypertension with end-stage renal disease on hemodialysis, history of polysubstance abuse.   In November 2022 he presented to the hospital with cold/flulike symptoms and was diagnosed with influenza A infection.  Echocardiogram was performed which noted severely reduced LVEF and concern for possible cardiac amyloidosis.  Patient's medical therapy was uptitrated but limited given his chronic kidney disease stage V.  He also underwent PYP study which was negative for cardiac amyloidosis.  He now presents for follow-up.  He denies angina pectoris or heart failure symptoms.  He continues to be active with lifestyle implementations (rides his bike to and from dialysis).  In the past hydralazine/isosorbide was discontinued due to hypotension/falls.  Carvedilol also reduced to 12.5 mg p.o. twice daily.  At the last office visit he was started on aspirin, statin therapy, and Corlanor.  However, he still has not initiated statin therapy or Corlanor for reasons unknown.  He is doing hemodialysis Monday Wednesday Friday and currently being worked up for home hemodialysis.  Patient now has Medicare and Medicaid unable to get his medications filled and labs performed.  I  FUNCTIONAL STATUS: No structured exercise program or daily routine.   ALLERGIES: Allergies  Allergen Reactions   Nsaids Other (See  Comments)    Patient was born with only 1 kidney and was told to not take NSAID(s)    MEDICATION LIST PRIOR TO VISIT: Current Meds  Medication Sig   aspirin EC 81 MG tablet Take 1 tablet (81 mg total) by mouth daily. Swallow whole.   B Complex-C-Zn-Folic Acid (DIALYVITE/ZINC) TABS Take 1 tablet by mouth daily.   carvedilol (COREG) 25 MG tablet Take 1 tablet (25 mg total) by mouth 2 (two) times daily with a meal. (Patient taking differently: Take 12.5 mg by mouth daily.)   SENSIPAR 30 MG tablet Take 30 mg by mouth once a week.     PAST MEDICAL HISTORY: Past Medical History:  Diagnosis Date   CHF (congestive heart failure) (HCC)    Congenital renal agenesis, unilateral    Hypertension    Unintentional weight loss     PAST SURGICAL HISTORY: Past Surgical History:  Procedure Laterality Date   AV FISTULA PLACEMENT Left 05/13/2021   Procedure: ARTERIOVENOUS (AV) FISTULA CREATION;  Surgeon: Broadus John, MD;  Location: [facility] OR;  Service: Vascular;  Laterality: Left;   IR FLUORO GUIDE CV LINE RIGHT  07/15/2021   IR US GUIDE VASC ACCESS RIGHT  07/16/2021    FAMILY HISTORY: The patient family history includes Dementia in his mother; Kidney disease in his mother; Stroke in his mother.  SOCIAL HISTORY:  The patient  reports that he quit smoking about 9 months ago. His smoking use included cigars. He has never used smokeless tobacco. He reports current alcohol use. He reports current drug use. Drug: Marijuana.  REVIEW OF SYSTEMS: Review of Systems  Cardiovascular:  Negative for chest pain, dyspnea  on exertion, leg swelling, near-syncope, orthopnea, palpitations, paroxysmal nocturnal dyspnea and syncope.  Respiratory:  Negative for shortness of breath.     PHYSICAL EXAM:    01/31/2022   11:51 AM 01/31/2022   11:49 AM 12/25/2021    9:35 AM  Vitals with BMI  Height  5\' 8"  5\' 8"   Weight  144 lbs 8 oz 141 lbs  BMI  74.12 87.86  Systolic 767 209 470  Diastolic 91 95 91  Pulse 962 112  105    CONSTITUTIONAL: Well-developed and well-nourished. No acute distress.  SKIN: Skin is warm and dry. No rash noted. No cyanosis. No pallor. No jaundice HEAD: Normocephalic and atraumatic.  EYES: No scleral icterus.  Arcus senilis MOUTH/THROAT: Moist oral membranes.  NECK: JVD present. No thyromegaly noted. No carotid bruits  CHEST Normal respiratory effort. No intercostal retractions.  Dialysis catheter right anterior chest wall LUNGS: Clear to auscultation bilaterally.  No stridor. No wheezes. No rales.  CARDIOVASCULAR: Regular, tachycardic, positive S1-S2, no murmurs rubs or gallops appreciated. ABDOMINAL: Soft, nontender, nondistended, positive bowel sounds in all 4 quadrants, no apparent ascites.  EXTREMITIES: No peripheral edema, warm to touch, 2+ DP and PT pulses bilaterally. HEMATOLOGIC: No significant bruising NEUROLOGIC: Oriented to person, place, and time. Nonfocal. Normal muscle tone.  PSYCHIATRIC: Normal mood and affect. Normal behavior. Cooperative  CARDIAC DATABASE: EKG: 06/18/2021: Sinus tachycardia, 104 bpm, normal axis, without underlying ischemia or injury pattern.  12/25/2021: Ectopic atrial rhythm, 99bpm, without underlying injury pattern.   Echocardiogram: 05/07/2021:  1. Left ventricular ejection fraction, by estimation, is 20 to 25%. The  left ventricle has severely decreased function. The left ventricle  demonstrates global hypokinesis. The left ventricular internal cavity size  was mildly dilated. There is moderate  left ventricular hypertrophy. Left ventricular diastolic parameters are  consistent with Grade I diastolic dysfunction (impaired relaxation). The  average left ventricular global longitudinal strain is -9.0 %. The global  longitudinal strain is abnormal.  Strain is relatively preserved at the apical cap. Bright myocardium with  LVH and relatively preserved apical strain suggests the possibility of  cardiac amyloidosis.   2. Right ventricular  systolic function is normal. The right ventricular  size is normal. There is normal pulmonary artery systolic pressure. The  estimated right ventricular systolic pressure is 83.6 mmHg.   3. The mitral valve is normal in structure. Trivial mitral valve  regurgitation. No evidence of mitral stenosis.   4. The aortic valve is tricuspid. Aortic valve regurgitation is mild. No  aortic stenosis is present.   5. Aortic dilatation noted. 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   6. The inferior vena cava is normal in size with greater than 50%  respiratory variability, suggesting right atrial pressure of 3 mmHg.    Stress Testing: No results found for this or any previous visit from the past 1095 days.  Heart Catheterization: None  PYP Study  05/10/2021: Visual and quantitative assessment (grade 1, H/CLL equal 1.22) are equivocally suggestive of transthyretin amyloidosis.  LABORATORY DATA:    Latest Ref Rng & Units 12/07/2021   12:37 PM 10/15/2021   10:44 AM 05/23/2021   11:12 AM  CBC  WBC 4.0 - 10.5 K/uL 5.0  5.7  4.4   Hemoglobin 13.0 - 17.0 g/dL 11.3  9.8  8.3   Hematocrit 39.0 - 52.0 % 34.5  31.1  27.2   Platelets 150 - 400 K/uL 185  169  209  Latest Ref Rng & Units 12/07/2021   12:37 PM 10/15/2021   10:44 AM 05/23/2021   11:12 AM  CMP  Glucose 70 - 99 mg/dL 71  89  105   BUN 6 - 20 mg/dL 72  49  60   Creatinine 0.61 - 1.24 mg/dL 14.44  17.19  6.13   Sodium 135 - 145 mmol/L 141  138  138   Potassium 3.5 - 5.1 mmol/L 4.5  4.7  5.7   Chloride 98 - 111 mmol/L 93  105  110   CO2 22 - 32 mmol/L 27  23  19    Calcium 8.9 - 10.3 mg/dL 9.8  8.5  8.2   Total Protein 6.5 - 8.1 g/dL 8.4  7.1  6.6   Total Bilirubin 0.3 - 1.2 mg/dL 0.7  0.6  0.5   Alkaline Phos 38 - 126 U/L 86  96  66   AST 15 - 41 U/L 31  16  30    ALT 0 - 44 U/L 14  11  33     Lipid Panel     Component Value Date/Time   CHOL 151 05/07/2021 1644   TRIG 135 05/07/2021 1644   HDL 66  05/07/2021 1644   CHOLHDL 2.3 05/07/2021 1644   VLDL 27 05/07/2021 1644   LDLCALC 58 05/07/2021 1644    No components found for: "NTPROBNP" No results for input(s): "PROBNP" in the last 8760 hours. Recent Labs    05/07/21 1126  TSH 1.142    BMP Recent Labs    05/23/21 1112 10/15/21 1044 12/07/21 1237  NA 138 138 141  K 5.7* 4.7 4.5  CL 110 105 93*  CO2 19* 23 27  GLUCOSE 105* 89 71  BUN 60* 49* 72*  CREATININE 6.13* 17.19* 14.44*  CALCIUM 8.2* 8.5* 9.8  GFRNONAA 10* 3* 4*    HEMOGLOBIN A1C Lab Results  Component Value Date   HGBA1C 4.6 (L) 05/07/2021   MPG 85.32 05/07/2021    IMPRESSION:    ICD-10-CM   1. Cardiomyopathy, unspecified type ([place])  I42.9 ivabradine (CORLANOR) 5 MG TABS tablet    Basic metabolic panel    Hemoglobin and hematocrit, blood    Magnesium    CBC with Differential/Platelet    2. Chronic combined systolic and diastolic heart failure (HCC)  I50.42 rosuvastatin (CRESTOR) 5 MG tablet    ivabradine (CORLANOR) 5 MG TABS tablet    Basic metabolic panel    Hemoglobin and hematocrit, blood    Magnesium    CBC with Differential/Platelet    3. 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 ESRD (end stage renal disease) on dialysis [facility])  N18.6    Z99.2        RECOMMENDATIONS: Phillip Lynch is a 56 y.o. African-American male whose past medical history and cardiac risk factors include: Chronic combined systolic and diastolic heart failure, stage C, NYHA class II, influenza A infection, history of solitary kidney, hypertension with end-stage renal disease on hemodialysis, history of polysubstance abuse.   Cardiomyopathy, unspecified type ([place]) Likely secondary to hypertensive heart disease. Did not undergo left and right heart catheterization during his last hospitalization due to chronic kidney disease stage V. Since he is now on dialysis we discussed undergoing left and right heart catheterizations to further  evaluate his coronary anatomy given his severely reduced LVEF.  Patient is agreeable.  The procedure of left and right heart catheterization with possible intervention  was explained to the patient. The indication, alternatives, risks and benefits were reviewed.  Complications include but not limited to bleeding, infection, vascular injury, stroke, myocardial infarction, arrhythmia (requiring medical or cardiopulmonary resuscitation), kidney injury (requiring short-term or long-term hemodialysis), radiation-related injury in the case of prolonged fluoroscopy use, emergent cardiac surgery, temporary or permanent pacemaker, and death. The patient understands the risks of serious complication is 1-2 in 1497 with diagnostic cardiac cath and 1-2% or less with angioplasty/stenting.  The patient  voices understanding and provides verbal feedback questions and concerns are addressed to his satisfaction and patient wishes to proceed with left and right heart catheterization with possible PCI.  Chronic combined systolic and diastolic heart failure (HCC) Stage C, NYHA class II Compensated. Likely due to hypertensive heart disease. TTR amyloidosis work-up was negative-see above. Currently on carvedilol. Was unable to tolerate BiDil or higher dose of carvedilol in the past. Due to renal function has not been on ACE inhibitor/ARB/ARNI/Farxiga/spironolactone due to risk of hyperkalemia. Start Corlanor 5 mg p.o. twice daily (samples for 2 weeks provided). Sent in a prescription for Crestor 5 mg p.o. nightly  Hypertensive heart and kidney disease with HF and ESRD (end stage renal disease) on dialysis ([place]) Office blood pressures within acceptable range. Medication changes as discussed above. Likely will require slow up titration of GDMT given his history of hypotension after hemodialysis/falls.  FINAL MEDICATION LIST END OF ENCOUNTER: Meds ordered this encounter  Medications   rosuvastatin (CRESTOR) 5 MG  tablet    Sig: Take 1 tablet (5 mg total) by mouth at bedtime.    Dispense:  90 tablet    Refill:  0   ivabradine (CORLANOR) 5 MG TABS tablet    Sig: Take 1 tablet (5 mg total) by mouth 2 (two) times daily with a meal.    Dispense:  180 tablet    Refill:  0    Medications Discontinued During This Encounter  Medication Reason   ivabradine (CORLANOR) 5 MG TABS tablet    ferrous sulfate 325 (65 FE) MG tablet    rosuvastatin (CRESTOR) 5 MG tablet      Current Outpatient Medications:    aspirin EC 81 MG tablet, Take 1 tablet (81 mg total) by mouth daily. Swallow whole., Disp: 90 tablet, Rfl: 0   B Complex-C-Zn-Folic Acid (DIALYVITE/ZINC) TABS, Take 1 tablet by mouth daily., Disp: , Rfl:    carvedilol (COREG) 25 MG tablet, Take 1 tablet (25 mg total) by mouth 2 (two) times daily with a meal. (Patient taking differently: Take 12.5 mg by mouth daily.), Disp: 60 tablet, Rfl: 2   SENSIPAR 30 MG tablet, Take 30 mg by mouth once a week., Disp: , Rfl:    ivabradine (CORLANOR) 5 MG TABS tablet, Take 1 tablet (5 mg total) by mouth 2 (two) times daily with a meal., Disp: 180 tablet, Rfl: 0   rosuvastatin (CRESTOR) 5 MG tablet, Take 1 tablet (5 mg total) by mouth at bedtime., Disp: 90 tablet, Rfl: 0  Orders Placed This Encounter  Procedures   Basic metabolic panel   Hemoglobin and hematocrit, blood   Magnesium   CBC with Differential/Platelet    There are no Patient Instructions on file for this visit.   --Continue cardiac medications as reconciled in final medication list. --Return in about 4 weeks (around 02/28/2022) for Follow up cardiomyopathy. . Or sooner if needed. --Continue follow-up with your primary care physician regarding the management of your other chronic comorbid conditions.  Patient's questions and concerns  were addressed to his satisfaction. He voices understanding of the instructions provided during this encounter.   This note was created using a voice recognition software as  a result there may be grammatical errors inadvertently enclosed that do not reflect the nature of this encounter. Every attempt is made to correct such errors.  Rex Kras, [place], [facility]  Pager: [phone] Office: [phone]

## 2022-01-31 NOTE — H&P (View-Only) (Signed)
Date:  01/31/2022   ID:  Phillip Lynch, DOB 1966-04-04, MRN [id number]  PCP:  Patient, [name]  Cardiologist:  Rex Kras, DO, [facility] (established care 05/07/2021) Advanced heart failure specialist: Dr. Haroldine Laws  Date: 01/31/22 Last Office Visit: 12/25/2021  Chief Complaint  Patient presents with   Heart Failure management   Follow-up    4 weeks    HPI  Phillip Lynch is a 56 y.o. African-American male whose past medical history and cardiovascular risk factors include: Chronic combined systolic and diastolic heart failure, stage C, NYHA class II, influenza A infection, history of solitary kidney, hypertension with end-stage renal disease on hemodialysis, history of polysubstance abuse.   In November 2022 he presented to the hospital with cold/flulike symptoms and was diagnosed with influenza A infection.  Echocardiogram was performed which noted severely reduced LVEF and concern for possible cardiac amyloidosis.  Patient's medical therapy was uptitrated but limited given his chronic kidney disease stage V.  He also underwent PYP study which was negative for cardiac amyloidosis.  He now presents for follow-up.  He denies angina pectoris or heart failure symptoms.  He continues to be active with lifestyle implementations (rides his bike to and from dialysis).  In the past hydralazine/isosorbide was discontinued due to hypotension/falls.  Carvedilol also reduced to 12.5 mg p.o. twice daily.  At the last office visit he was started on aspirin, statin therapy, and Corlanor.  However, he still has not initiated statin therapy or Corlanor for reasons unknown.  He is doing hemodialysis Monday Wednesday Friday and currently being worked up for home hemodialysis.  Patient now has Medicare and Medicaid unable to get his medications filled and labs performed.  I  FUNCTIONAL STATUS: No structured exercise program or daily routine.   ALLERGIES: Allergies  Allergen Reactions   Nsaids Other (See  Comments)    Patient was born with only 1 kidney and was told to not take NSAID(s)    MEDICATION LIST PRIOR TO VISIT: Current Meds  Medication Sig   aspirin EC 81 MG tablet Take 1 tablet (81 mg total) by mouth daily. Swallow whole.   B Complex-C-Zn-Folic Acid (DIALYVITE/ZINC) TABS Take 1 tablet by mouth daily.   carvedilol (COREG) 25 MG tablet Take 1 tablet (25 mg total) by mouth 2 (two) times daily with a meal. (Patient taking differently: Take 12.5 mg by mouth daily.)   SENSIPAR 30 MG tablet Take 30 mg by mouth once a week.     PAST MEDICAL HISTORY: Past Medical History:  Diagnosis Date   CHF (congestive heart failure) (HCC)    Congenital renal agenesis, unilateral    Hypertension    Unintentional weight loss     PAST SURGICAL HISTORY: Past Surgical History:  Procedure Laterality Date   AV FISTULA PLACEMENT Left 05/13/2021   Procedure: ARTERIOVENOUS (AV) FISTULA CREATION;  Surgeon: Broadus John, MD;  Location: [facility] OR;  Service: Vascular;  Laterality: Left;   IR FLUORO GUIDE CV LINE RIGHT  07/15/2021   IR US GUIDE VASC ACCESS RIGHT  07/16/2021    FAMILY HISTORY: The patient family history includes Dementia in his mother; Kidney disease in his mother; Stroke in his mother.  SOCIAL HISTORY:  The patient  reports that he quit smoking about 9 months ago. His smoking use included cigars. He has never used smokeless tobacco. He reports current alcohol use. He reports current drug use. Drug: Marijuana.  REVIEW OF SYSTEMS: Review of Systems  Cardiovascular:  Negative for chest pain, dyspnea  on exertion, leg swelling, near-syncope, orthopnea, palpitations, paroxysmal nocturnal dyspnea and syncope.  Respiratory:  Negative for shortness of breath.     PHYSICAL EXAM:    01/31/2022   11:51 AM 01/31/2022   11:49 AM 12/25/2021    9:35 AM  Vitals with BMI  Height  5\' 8"  5\' 8"   Weight  144 lbs 8 oz 141 lbs  BMI  42.35 36.14  Systolic 431 540 086  Diastolic 91 95 91  Pulse 761 112  105    CONSTITUTIONAL: Well-developed and well-nourished. No acute distress.  SKIN: Skin is warm and dry. No rash noted. No cyanosis. No pallor. No jaundice HEAD: Normocephalic and atraumatic.  EYES: No scleral icterus.  Arcus senilis MOUTH/THROAT: Moist oral membranes.  NECK: JVD present. No thyromegaly noted. No carotid bruits  CHEST Normal respiratory effort. No intercostal retractions.  Dialysis catheter right anterior chest wall LUNGS: Clear to auscultation bilaterally.  No stridor. No wheezes. No rales.  CARDIOVASCULAR: Regular, tachycardic, positive S1-S2, no murmurs rubs or gallops appreciated. ABDOMINAL: Soft, nontender, nondistended, positive bowel sounds in all 4 quadrants, no apparent ascites.  EXTREMITIES: No peripheral edema, warm to touch, 2+ DP and PT pulses bilaterally. HEMATOLOGIC: No significant bruising NEUROLOGIC: Oriented to person, place, and time. Nonfocal. Normal muscle tone.  PSYCHIATRIC: Normal mood and affect. Normal behavior. Cooperative  CARDIAC DATABASE: EKG: 06/18/2021: Sinus tachycardia, 104 bpm, normal axis, without underlying ischemia or injury pattern.  12/25/2021: Ectopic atrial rhythm, 99bpm, without underlying injury pattern.   Echocardiogram: 05/07/2021:  1. Left ventricular ejection fraction, by estimation, is 20 to 25%. The  left ventricle has severely decreased function. The left ventricle  demonstrates global hypokinesis. The left ventricular internal cavity size  was mildly dilated. There is moderate  left ventricular hypertrophy. Left ventricular diastolic parameters are  consistent with Grade I diastolic dysfunction (impaired relaxation). The  average left ventricular global longitudinal strain is -9.0 %. The global  longitudinal strain is abnormal.  Strain is relatively preserved at the apical cap. Bright myocardium with  LVH and relatively preserved apical strain suggests the possibility of  cardiac amyloidosis.   2. Right ventricular  systolic function is normal. The right ventricular  size is normal. There is normal pulmonary artery systolic pressure. The  estimated right ventricular systolic pressure is 95.0 mmHg.   3. The mitral valve is normal in structure. Trivial mitral valve  regurgitation. No evidence of mitral stenosis.   4. The aortic valve is tricuspid. Aortic valve regurgitation is mild. No  aortic stenosis is present.   5. Aortic dilatation noted. 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   6. The inferior vena cava is normal in size with greater than 50%  respiratory variability, suggesting right atrial pressure of 3 mmHg.    Stress Testing: No results found for this or any previous visit from the past 1095 days.  Heart Catheterization: None  PYP Study  05/10/2021: Visual and quantitative assessment (grade 1, H/CLL equal 1.22) are equivocally suggestive of transthyretin amyloidosis.  LABORATORY DATA:    Latest Ref Rng & Units 12/07/2021   12:37 PM 10/15/2021   10:44 AM 05/23/2021   11:12 AM  CBC  WBC 4.0 - 10.5 K/uL 5.0  5.7  4.4   Hemoglobin 13.0 - 17.0 g/dL 11.3  9.8  8.3   Hematocrit 39.0 - 52.0 % 34.5  31.1  27.2   Platelets 150 - 400 K/uL 185  169  209  Latest Ref Rng & Units 12/07/2021   12:37 PM 10/15/2021   10:44 AM 05/23/2021   11:12 AM  CMP  Glucose 70 - 99 mg/dL 71  89  105   BUN 6 - 20 mg/dL 72  49  60   Creatinine 0.61 - 1.24 mg/dL 14.44  17.19  6.13   Sodium 135 - 145 mmol/L 141  138  138   Potassium 3.5 - 5.1 mmol/L 4.5  4.7  5.7   Chloride 98 - 111 mmol/L 93  105  110   CO2 22 - 32 mmol/L 27  23  19    Calcium 8.9 - 10.3 mg/dL 9.8  8.5  8.2   Total Protein 6.5 - 8.1 g/dL 8.4  7.1  6.6   Total Bilirubin 0.3 - 1.2 mg/dL 0.7  0.6  0.5   Alkaline Phos 38 - 126 U/L 86  96  66   AST 15 - 41 U/L 31  16  30    ALT 0 - 44 U/L 14  11  33     Lipid Panel     Component Value Date/Time   CHOL 151 05/07/2021 1644   TRIG 135 05/07/2021 1644   HDL 66  05/07/2021 1644   CHOLHDL 2.3 05/07/2021 1644   VLDL 27 05/07/2021 1644   LDLCALC 58 05/07/2021 1644    No components found for: "NTPROBNP" No results for input(s): "PROBNP" in the last 8760 hours. Recent Labs    05/07/21 1126  TSH 1.142    BMP Recent Labs    05/23/21 1112 10/15/21 1044 12/07/21 1237  NA 138 138 141  K 5.7* 4.7 4.5  CL 110 105 93*  CO2 19* 23 27  GLUCOSE 105* 89 71  BUN 60* 49* 72*  CREATININE 6.13* 17.19* 14.44*  CALCIUM 8.2* 8.5* 9.8  GFRNONAA 10* 3* 4*    HEMOGLOBIN A1C Lab Results  Component Value Date   HGBA1C 4.6 (L) 05/07/2021   MPG 85.32 05/07/2021    IMPRESSION:    ICD-10-CM   1. Cardiomyopathy, unspecified type ([place])  I42.9 ivabradine (CORLANOR) 5 MG TABS tablet    Basic metabolic panel    Hemoglobin and hematocrit, blood    Magnesium    CBC with Differential/Platelet    2. Chronic combined systolic and diastolic heart failure (HCC)  I50.42 rosuvastatin (CRESTOR) 5 MG tablet    ivabradine (CORLANOR) 5 MG TABS tablet    Basic metabolic panel    Hemoglobin and hematocrit, blood    Magnesium    CBC with Differential/Platelet    3. 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 ESRD (end stage renal disease) on dialysis [facility])  N18.6    Z99.2        RECOMMENDATIONS: Dandrae Kustra is a 55 y.o. African-American male whose past medical history and cardiac risk factors include: Chronic combined systolic and diastolic heart failure, stage C, NYHA class II, influenza A infection, history of solitary kidney, hypertension with end-stage renal disease on hemodialysis, history of polysubstance abuse.   Cardiomyopathy, unspecified type ([place]) Likely secondary to hypertensive heart disease. Did not undergo left and right heart catheterization during his last hospitalization due to chronic kidney disease stage V. Since he is now on dialysis we discussed undergoing left and right heart catheterizations to further  evaluate his coronary anatomy given his severely reduced LVEF.  Patient is agreeable.  The procedure of left and right heart catheterization with possible intervention  was explained to the patient. The indication, alternatives, risks and benefits were reviewed.  Complications include but not limited to bleeding, infection, vascular injury, stroke, myocardial infarction, arrhythmia (requiring medical or cardiopulmonary resuscitation), kidney injury (requiring short-term or long-term hemodialysis), radiation-related injury in the case of prolonged fluoroscopy use, emergent cardiac surgery, temporary or permanent pacemaker, and death. The patient understands the risks of serious complication is 1-2 in 4098 with diagnostic cardiac cath and 1-2% or less with angioplasty/stenting.  The patient  voices understanding and provides verbal feedback questions and concerns are addressed to his satisfaction and patient wishes to proceed with left and right heart catheterization with possible PCI.  Chronic combined systolic and diastolic heart failure (HCC) Stage C, NYHA class II Compensated. Likely due to hypertensive heart disease. TTR amyloidosis work-up was negative-see above. Currently on carvedilol. Was unable to tolerate BiDil or higher dose of carvedilol in the past. Due to renal function has not been on ACE inhibitor/ARB/ARNI/Farxiga/spironolactone due to risk of hyperkalemia. Start Corlanor 5 mg p.o. twice daily (samples for 2 weeks provided). Sent in a prescription for Crestor 5 mg p.o. nightly  Hypertensive heart and kidney disease with HF and ESRD (end stage renal disease) on dialysis ([place]) Office blood pressures within acceptable range. Medication changes as discussed above. Likely will require slow up titration of GDMT given his history of hypotension after hemodialysis/falls.  FINAL MEDICATION LIST END OF ENCOUNTER: Meds ordered this encounter  Medications   rosuvastatin (CRESTOR) 5 MG  tablet    Sig: Take 1 tablet (5 mg total) by mouth at bedtime.    Dispense:  90 tablet    Refill:  0   ivabradine (CORLANOR) 5 MG TABS tablet    Sig: Take 1 tablet (5 mg total) by mouth 2 (two) times daily with a meal.    Dispense:  180 tablet    Refill:  0    Medications Discontinued During This Encounter  Medication Reason   ivabradine (CORLANOR) 5 MG TABS tablet    ferrous sulfate 325 (65 FE) MG tablet    rosuvastatin (CRESTOR) 5 MG tablet      Current Outpatient Medications:    aspirin EC 81 MG tablet, Take 1 tablet (81 mg total) by mouth daily. Swallow whole., Disp: 90 tablet, Rfl: 0   B Complex-C-Zn-Folic Acid (DIALYVITE/ZINC) TABS, Take 1 tablet by mouth daily., Disp: , Rfl:    carvedilol (COREG) 25 MG tablet, Take 1 tablet (25 mg total) by mouth 2 (two) times daily with a meal. (Patient taking differently: Take 12.5 mg by mouth daily.), Disp: 60 tablet, Rfl: 2   SENSIPAR 30 MG tablet, Take 30 mg by mouth once a week., Disp: , Rfl:    ivabradine (CORLANOR) 5 MG TABS tablet, Take 1 tablet (5 mg total) by mouth 2 (two) times daily with a meal., Disp: 180 tablet, Rfl: 0   rosuvastatin (CRESTOR) 5 MG tablet, Take 1 tablet (5 mg total) by mouth at bedtime., Disp: 90 tablet, Rfl: 0  Orders Placed This Encounter  Procedures   Basic metabolic panel   Hemoglobin and hematocrit, blood   Magnesium   CBC with Differential/Platelet    There are no Patient Instructions on file for this visit.   --Continue cardiac medications as reconciled in final medication list. --Return in about 4 weeks (around 02/28/2022) for Follow up cardiomyopathy. . Or sooner if needed. --Continue follow-up with your primary care physician regarding the management of your other chronic comorbid conditions.  Patient's questions and concerns  were addressed to his satisfaction. He voices understanding of the instructions provided during this encounter.   This note was created using a voice recognition software as  a result there may be grammatical errors inadvertently enclosed that do not reflect the nature of this encounter. Every attempt is made to correct such errors.  Rex Kras, [place], [facility]  Pager: [phone] Office: [phone]

## 2022-02-18 ENCOUNTER — Other Ambulatory Visit: Payer: Self-pay

## 2022-02-18 ENCOUNTER — Ambulatory Visit (HOSPITAL_COMMUNITY)
Admission: RE | Admit: 2022-02-18 | Discharge: 2022-02-18 | Disposition: A | Discharge status: Home / Self Care | Payer: Medicare Other | Attending: Cardiology | Admitting: Cardiology

## 2022-02-18 ENCOUNTER — Ambulatory Visit (HOSPITAL_COMMUNITY)
Admission: RE | Disposition: A | Discharge status: Home / Self Care | Payer: Self-pay | Source: Home / Self Care | Attending: Cardiology

## 2022-02-18 DIAGNOSIS — Z79899 Other long term (current) drug therapy: Secondary | ICD-10-CM | POA: Insufficient documentation

## 2022-02-18 DIAGNOSIS — Z992 Dependence on renal dialysis: Secondary | ICD-10-CM | POA: Insufficient documentation

## 2022-02-18 DIAGNOSIS — I5042 Chronic combined systolic (congestive) and diastolic (congestive) heart failure: Secondary | ICD-10-CM | POA: Diagnosis not present

## 2022-02-18 DIAGNOSIS — I429 Cardiomyopathy, unspecified: Secondary | ICD-10-CM | POA: Insufficient documentation

## 2022-02-18 DIAGNOSIS — Z87891 Personal history of nicotine dependence: Secondary | ICD-10-CM | POA: Diagnosis not present

## 2022-02-18 DIAGNOSIS — N186 End stage renal disease: Secondary | ICD-10-CM | POA: Diagnosis not present

## 2022-02-18 DIAGNOSIS — I5022 Chronic systolic (congestive) heart failure: Secondary | ICD-10-CM

## 2022-02-18 DIAGNOSIS — E785 Hyperlipidemia, unspecified: Secondary | ICD-10-CM | POA: Diagnosis not present

## 2022-02-18 DIAGNOSIS — I132 Hypertensive heart and chronic kidney disease with heart failure and with stage 5 chronic kidney disease, or end stage renal disease: Secondary | ICD-10-CM | POA: Insufficient documentation

## 2022-02-18 HISTORY — PX: RIGHT/LEFT HEART CATH AND CORONARY ANGIOGRAPHY: CATH118266

## 2022-02-18 LAB — POCT I-STAT EG7
Acid-base deficit: 7 mmol/L — ABNORMAL HIGH (ref 0.0–2.0)
Bicarbonate: 19.9 mmol/L — ABNORMAL LOW (ref 20.0–28.0)
Calcium, Ion: 0.87 mmol/L — CL (ref 1.15–1.40)
HCT: 32 % — ABNORMAL LOW (ref 39.0–52.0)
Hemoglobin: 10.9 g/dL — ABNORMAL LOW (ref 13.0–17.0)
O2 Saturation: 69 %
Potassium: 4.3 mmol/L (ref 3.5–5.1)
Sodium: 118 mmol/L — CL (ref 135–145)
TCO2: 21 mmol/L — ABNORMAL LOW (ref 22–32)
pCO2, Ven: 44.9 mmHg (ref 44–60)
pH, Ven: 7.253 (ref 7.25–7.43)
pO2, Ven: 42 mmHg (ref 32–45)

## 2022-02-18 LAB — POCT I-STAT 7, (LYTES, BLD GAS, ICA,H+H)
Acid-base deficit: 4 mmol/L — ABNORMAL HIGH (ref 0.0–2.0)
Bicarbonate: 21.6 mmol/L (ref 20.0–28.0)
Calcium, Ion: 0.92 mmol/L — ABNORMAL LOW (ref 1.15–1.40)
HCT: 32 % — ABNORMAL LOW (ref 39.0–52.0)
Hemoglobin: 10.9 g/dL — ABNORMAL LOW (ref 13.0–17.0)
O2 Saturation: 97 %
Potassium: 4.8 mmol/L (ref 3.5–5.1)
Sodium: 127 mmol/L — ABNORMAL LOW (ref 135–145)
TCO2: 23 mmol/L (ref 22–32)
pCO2 arterial: 40.5 mmHg (ref 32–48)
pH, Arterial: 7.334 — ABNORMAL LOW (ref 7.35–7.45)
pO2, Arterial: 94 mmHg (ref 83–108)

## 2022-02-18 SURGERY — RIGHT/LEFT HEART CATH AND CORONARY ANGIOGRAPHY
Anesthesia: LOCAL

## 2022-02-18 MED ORDER — HEPARIN (PORCINE) IN NACL 1000-0.9 UT/500ML-% IV SOLN
INTRAVENOUS | Status: AC
Start: 2022-02-18 — End: ?
  Filled 2022-02-18: qty 500

## 2022-02-18 MED ORDER — ASPIRIN 81 MG PO CHEW
CHEWABLE_TABLET | ORAL | Status: AC
  Administered 2022-02-18: 81 mg via ORAL
  Filled 2022-02-18: qty 1

## 2022-02-18 MED ORDER — HYDRALAZINE HCL 20 MG/ML IJ SOLN
10.0000 mg | INTRAMUSCULAR | Status: DC | PRN
  Administered 2022-02-18: 10 mg via INTRAVENOUS

## 2022-02-18 MED ORDER — SODIUM CHLORIDE 0.9% FLUSH
3.0000 mL | Freq: Two times a day (BID) | INTRAVENOUS | Status: DC
Start: 2022-02-18 — End: 2022-02-18

## 2022-02-18 MED ORDER — ONDANSETRON HCL 4 MG/2ML IJ SOLN: 4.0000 mg | Freq: Four times a day (QID) | INTRAMUSCULAR | Status: DC | PRN

## 2022-02-18 MED ORDER — SODIUM CHLORIDE 0.9 % IV SOLN: 250.0000 mL | INTRAVENOUS | Status: DC | PRN

## 2022-02-18 MED ORDER — IOHEXOL 350 MG/ML SOLN
INTRAVENOUS | Status: DC | PRN
  Administered 2022-02-18: 35 mL

## 2022-02-18 MED ORDER — SODIUM CHLORIDE 0.9 % IV SOLN: INTRAVENOUS | Status: DC

## 2022-02-18 MED ORDER — MIDAZOLAM HCL 2 MG/2ML IJ SOLN
INTRAMUSCULAR | Status: AC
  Filled 2022-02-18: qty 2

## 2022-02-18 MED ORDER — SODIUM CHLORIDE 0.9% FLUSH: 3.0000 mL | INTRAVENOUS | Status: DC | PRN

## 2022-02-18 MED ORDER — MIDAZOLAM HCL 2 MG/2ML IJ SOLN
INTRAMUSCULAR | Status: DC | PRN
  Administered 2022-02-18: 2 mg via INTRAVENOUS

## 2022-02-18 MED ORDER — VERAPAMIL HCL 2.5 MG/ML IV SOLN
INTRAVENOUS | Status: AC
  Filled 2022-02-18: qty 2

## 2022-02-18 MED ORDER — HEPARIN (PORCINE) IN NACL 1000-0.9 UT/500ML-% IV SOLN
INTRAVENOUS | Status: AC
  Filled 2022-02-18: qty 500

## 2022-02-18 MED ORDER — HYDRALAZINE HCL 20 MG/ML IJ SOLN
INTRAMUSCULAR | Status: AC
  Filled 2022-02-18: qty 1

## 2022-02-18 MED ORDER — LIDOCAINE HCL (PF) 1 % IJ SOLN
INTRAMUSCULAR | Status: DC | PRN
  Administered 2022-02-18: 5 mL

## 2022-02-18 MED ORDER — LIDOCAINE HCL (PF) 1 % IJ SOLN
INTRAMUSCULAR | Status: AC
Start: 2022-02-18 — End: ?
  Filled 2022-02-18: qty 30

## 2022-02-18 MED ORDER — SODIUM CHLORIDE 0.9 % IV SOLN: INTRAVENOUS | Status: AC

## 2022-02-18 MED ORDER — HEPARIN (PORCINE) IN NACL 1000-0.9 UT/500ML-% IV SOLN
INTRAVENOUS | Status: DC | PRN
  Administered 2022-02-18 (×2): 500 mL

## 2022-02-18 MED ORDER — SODIUM CHLORIDE 0.9% FLUSH: 3.0000 mL | Freq: Two times a day (BID) | INTRAVENOUS | Status: DC

## 2022-02-18 MED ORDER — ASPIRIN 81 MG PO CHEW: 81.0000 mg | CHEWABLE_TABLET | ORAL | Status: AC

## 2022-02-18 MED ORDER — ASPIRIN 81 MG PO CHEW: 81.0000 mg | CHEWABLE_TABLET | ORAL | Status: DC

## 2022-02-18 MED ORDER — ACETAMINOPHEN 325 MG PO TABS: 650.0000 mg | ORAL_TABLET | ORAL | Status: DC | PRN

## 2022-02-18 SURGICAL SUPPLY — 14 items
CATH BALLN WEDGE 5F 110CM (CATHETERS) IMPLANT
CATH INFINITI 5 FR MPA2 (CATHETERS) IMPLANT
GLIDESHEATH SLEND SS 6F .021 (SHEATH) IMPLANT
GUIDEWIRE .025 260CM (WIRE) IMPLANT
GUIDEWIRE INQWIRE 1.5J.035X260 (WIRE) IMPLANT
INQWIRE 1.5J .035X260CM (WIRE) ×1
KIT HEART LEFT (KITS) ×1 IMPLANT
KIT MICROPUNCTURE NIT STIFF (SHEATH) IMPLANT
PACK CARDIAC CATHETERIZATION (CUSTOM PROCEDURE TRAY) ×1 IMPLANT
SHEATH GLIDE SLENDER 4/5FR (SHEATH) IMPLANT
SHEATH PINNACLE 5F 10CM (SHEATH) IMPLANT
SHEATH PROBE COVER 6X72 (BAG) IMPLANT
TRANSDUCER W/STOPCOCK (MISCELLANEOUS) ×1 IMPLANT
TUBING CIL FLEX 10 FLL-RA (TUBING) ×1 IMPLANT

## 2022-02-18 NOTE — Progress Notes (Signed)
Site area: RT GROIN Site Prior to Removal:  Level  0 Pressure Applied For: 20 MINUTES Manual:   YES Patient Status During Pull:  ASLEEP Post Pull Site:  Level 0 Post Pull Instructions Given: YES  Post Pull Pulses Present: LT DP PALPABLE Dressing Applied:  YES Bedrest begins @ 14: 05 Comments:

## 2022-02-18 NOTE — Interval H&P Note (Signed)
History and Physical Interval Note:  02/18/2022 12:19 PM  Phillip Lynch  has presented today for surgery, with the diagnosis of hf , cardiomyopathy.  The various methods of treatment have been discussed with the patient and family. After consideration of risks, benefits and other options for treatment, the patient has consented to  Procedure(s): RIGHT/LEFT HEART CATH AND CORONARY ANGIOGRAPHY (N/A) and possible angioplasty as a surgical intervention.  The patient's history has been reviewed, patient examined, no change in status, stable for surgery.  I have reviewed the patient's chart and labs.  Questions were answered to the patient's satisfaction.   Cath Lab Visit (complete for each Cath Lab visit)  Clinical Evaluation Leading to the Procedure:   ACS: No.  Non-ACS:    Anginal Classification: CCS III  Anti-ischemic medical therapy: Minimal Therapy (1 class of medications)  Non-Invasive Test Results: No non-invasive testing performed  Prior CABG: No previous CABG   Adrian Prows

## 2022-02-19 ENCOUNTER — Encounter (HOSPITAL_COMMUNITY): Payer: Self-pay | Admitting: Cardiology

## 2022-02-19 MED FILL — Verapamil HCl IV Soln 2.5 MG/ML: INTRAVENOUS | Qty: 2 | Status: AC

## 2022-02-20 LAB — POCT I-STAT EG7
Acid-base deficit: 8 mmol/L — ABNORMAL HIGH (ref 0.0–2.0)
Bicarbonate: 19 mmol/L — ABNORMAL LOW (ref 20.0–28.0)
Calcium, Ion: 0.85 mmol/L — CL (ref 1.15–1.40)
HCT: 32 % — ABNORMAL LOW (ref 39.0–52.0)
Hemoglobin: 10.9 g/dL — ABNORMAL LOW (ref 13.0–17.0)
O2 Saturation: 69 %
Potassium: 4.2 mmol/L (ref 3.5–5.1)
Sodium: 117 mmol/L — CL (ref 135–145)
TCO2: 20 mmol/L — ABNORMAL LOW (ref 22–32)
pCO2, Ven: 44.1 mmHg (ref 44–60)
pH, Ven: 7.242 — ABNORMAL LOW (ref 7.25–7.43)
pO2, Ven: 42 mmHg (ref 32–45)

## 2022-02-20 NOTE — Progress Notes (Signed)
Will address his electrolyte issues with follow up

## 2022-02-25 ENCOUNTER — Ambulatory Visit: Payer: Medicare Other | Admitting: Cardiology

## 2022-02-28 ENCOUNTER — Ambulatory Visit: Payer: Medicare Other | Admitting: Cardiology

## 2022-03-24 NOTE — Progress Notes (Unsigned)
[facility] [place]  ASSESSMENT / PLAN: Phillip Lynch is a 56 y.o. right handed male in need of permanent dialysis access. I reviewed options for dialysis in detail with the patient, including hemodialysis and peritoneal dialysis. I counseled the patient to ask their nephrologist about their candidacy for renal transplant. I counseled the patient that dialysis access requires surveillance and periodic maintenance. Plan to proceed with laparoscopic peritoneal dialysis catheter placement 04/10/22.   CHIEF COMPLAINT: Desires transition to peritoneal dialysis  HISTORY OF PRESENT ILLNESS: Phillip Lynch is a 56 y.o. male who presents to clinic for evaluation of peritoneal dialysis candidacy.  The patient is a very active gentleman with end-stage renal disease, dialyzing Monday, Wednesday, and Friday through a right IJ tunneled dialysis catheter.  He has no personal history of abdominal surgery.  We reviewed the risks and benefits of peritoneal dialysis.  He is a good candidate for peritoneal dialysis.  VASCULAR SURGICAL HISTORY: left brachiocephalic arteriovenous fistula 05/13/21.   Past Medical History:  Diagnosis Date   CHF (congestive heart failure) (HCC)    Congenital renal agenesis, unilateral    Hypertension    Unintentional weight loss     Past Surgical History:  Procedure Laterality Date   AV FISTULA PLACEMENT Left 05/13/2021   Procedure: ARTERIOVENOUS (AV) FISTULA CREATION;  Surgeon: Broadus John, MD;  Location: [facility] OR;  Service: Vascular;  Laterality: Left;   IR FLUORO GUIDE CV LINE RIGHT  07/15/2021   IR US GUIDE VASC ACCESS RIGHT  07/16/2021   RIGHT/LEFT HEART CATH AND CORONARY ANGIOGRAPHY N/A 02/18/2022   Procedure: RIGHT/LEFT HEART CATH AND CORONARY ANGIOGRAPHY;  Surgeon: Adrian Prows, MD;  Location: [facility];  Service: Cardiovascular;  Laterality: N/A;    Family History  Problem Relation Age of Onset   Stroke Mother    Dementia Mother     Kidney disease Mother     Social History   Socioeconomic History   Marital status: Divorced    Spouse name: Not on file   Number of children: 1   Years of education: Not on file   Highest education level: Not on file  Occupational History   Occupation: Landscaper  Tobacco Use   Smoking status: Former    Types: Cigars    Quit date: 04/2021    Years since quitting: 0.9   Smokeless tobacco: Never  Vaping Use   Vaping Use: Never used  Substance and Sexual Activity   Alcohol use: Yes    Comment: occ   Drug use: Yes    Types: Marijuana   Sexual activity: Yes  Other Topics Concern   Not on file  Social History Narrative   Works as a Development worker, 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 His father passed in March and his mother moved into a nursing facility, and unfortunately he lost ownership of his family's house so has been experiencing housing instability since early summer. Occasionally resides with friend, also gets services from [facility]. Has slept outside as well.    Social Determinants of Health   Financial Resource Strain: High Risk (05/13/2021)   Overall Financial Resource Strain (CARDIA)    Difficulty of Paying Living Expenses: Very hard  Food Insecurity: Food Insecurity Present (05/13/2021)   Hunger Vital Sign    Worried About Running Out of Food in the Last Year: Often true    Ran Out of Food in the Last Year: Often true  Transportation Needs: Unmet Transportation Needs (05/13/2021)   PRAPARE - Transportation    Lack  of Transportation (Medical): Yes    Lack of Transportation (Non-Medical): Yes  Physical Activity: Not on file  Stress: Not on file  Social Connections: Not on file  Intimate Partner Violence: Not on file    Allergies  Allergen Reactions   Nsaids Other (See Comments)    Patient was born with only 1 kidney and was told to not take NSAID(s)    Current Outpatient Medications  Medication Sig Dispense Refill   B Complex-C-Zn-Folic Acid (DIALYVITE/ZINC) TABS Take 1 tablet by  mouth in the morning.     carvedilol (COREG) 25 MG tablet Take 1 tablet (25 mg total) by mouth 2 (two) times daily with a meal. (Patient taking differently: Take 12.5 mg by mouth in the morning and at bedtime.) 60 tablet 2   ivabradine (CORLANOR) 5 MG TABS tablet Take 1 tablet (5 mg total) by mouth 2 (two) times daily with a meal. (Patient taking differently: Take 5 mg by mouth daily with supper.) 180 tablet 0   rosuvastatin (CRESTOR) 5 MG tablet Take 1 tablet (5 mg total) by mouth at bedtime. 90 tablet 0   SENSIPAR 30 MG tablet Take 30 mg by mouth every Monday, Wednesday, and Friday at 8 PM.     sucroferric oxyhydroxide (VELPHORO) 500 MG chewable tablet Chew 500 mg by mouth 3 (three) times daily with meals.     No current facility-administered medications for this visit.    PHYSICAL EXAM Vitals:   03/25/22 1010  BP: 108/84  Pulse: (!) 104  Resp: 20  Temp: 97.8 F (36.6 C)  SpO2: 100%  Weight: 144 lb (65.3 kg)  Height: 5\' 8"  (1.727 m)    Appearing man in no acute distress Regular rate and rhythm Unlabored breathing Soft nontender abdomen From both left arm brachiocephalic arteriovenous fistula  PERTINENT LABORATORY AND RADIOLOGIC DATA  Most recent CBC    Latest Ref Rng & Units 02/18/2022   12:38 PM 02/18/2022   12:33 PM 12/07/2021   12:37 PM  CBC  WBC 4.0 - 10.5 K/uL   5.0   Hemoglobin 13.0 - 17.0 g/dL 13.0 - 17.0 g/dL 10.9    10.9  10.9  11.3   Hematocrit 39.0 - 52.0 % 39.0 - 52.0 % 32.0    32.0  32.0  34.5   Platelets 150 - 400 K/uL   185      Most recent CMP    Latest Ref Rng & Units 02/18/2022   12:38 PM 02/18/2022   12:33 PM 12/07/2021   12:37 PM  CMP  Glucose 70 - 99 mg/dL   71   BUN 6 - 20 mg/dL   72   Creatinine 0.61 - 1.24 mg/dL   14.44   Sodium 135 - 145 mmol/L 135 - 145 mmol/L 118    117  127  141   Potassium 3.5 - 5.1 mmol/L 3.5 - 5.1 mmol/L 4.3    4.2  4.8  4.5   Chloride 98 - 111 mmol/L   93   CO2 22 - 32 mmol/L   27   Calcium 8.9 - 10.3 mg/dL    9.8   Total Protein 6.5 - 8.1 g/dL   8.4   Total Bilirubin 0.3 - 1.2 mg/dL   0.7   Alkaline Phos 38 - 126 U/L   86   AST 15 - 41 U/L   31   ALT 0 - 44 U/L   14     Renal function CrCl cannot be calculated (Patient's  most recent lab result is older than the maximum 21 days allowed.).  Hgb A1c MFr Bld (%)  Date Value  05/07/2021 4.6 (L)    LDL Cholesterol  Date Value Ref Range Status  05/07/2021 58 0 - 99 mg/dL Final    Comment:           Total Cholesterol/HDL:CHD Risk Coronary Heart Disease Risk Table                     Men   Women  1/2 Average Risk   3.4   3.3  Average Risk       5.0   4.4  2 X Average Risk   9.6   7.1  3 X Average Risk  23.4   11.0        Use the calculated Patient Ratio above and the CHD Risk Table to determine the patient's CHD Risk.        ATP III CLASSIFICATION (LDL):  <100     mg/dL   Optimal  100-129  mg/dL   Near or Above                    Optimal  130-159  mg/dL   Borderline  160-189  mg/dL   High  >190     mg/dL   Very High Performed at [facility] [address] [place], [place] [address]      Yevonne Aline. Stanford Breed, MD [facility] Phone Number: [phone] 03/25/2022 10:23 AM  Total time spent on preparing this encounter including chart review, data review, collecting history, examining the patient, coordinating care for this established patient, 30 minutes.  Portions of this report may have been transcribed using voice recognition software.  Every effort has been made to ensure accuracy; however, inadvertent computerized transcription errors may still be present.

## 2022-03-24 NOTE — H&P (View-Only) (Signed)
[facility] [place]  ASSESSMENT / PLAN: Phillip Lynch is a 56 y.o. right handed male in need of permanent dialysis access. I reviewed options for dialysis in detail with the patient, including hemodialysis and peritoneal dialysis. I counseled the patient to ask their nephrologist about their candidacy for renal transplant. I counseled the patient that dialysis access requires surveillance and periodic maintenance. Plan to proceed with laparoscopic peritoneal dialysis catheter placement 04/10/22.   CHIEF COMPLAINT: Desires transition to peritoneal dialysis  HISTORY OF PRESENT ILLNESS: Phillip Lynch is a 56 y.o. male who presents to clinic for evaluation of peritoneal dialysis candidacy.  The patient is a very active gentleman with end-stage renal disease, dialyzing Monday, Wednesday, and Friday through a right IJ tunneled dialysis catheter.  He has no personal history of abdominal surgery.  We reviewed the risks and benefits of peritoneal dialysis.  He is a good candidate for peritoneal dialysis.  VASCULAR SURGICAL HISTORY: left brachiocephalic arteriovenous fistula 05/13/21.   Past Medical History:  Diagnosis Date   CHF (congestive heart failure) (HCC)    Congenital renal agenesis, unilateral    Hypertension    Unintentional weight loss     Past Surgical History:  Procedure Laterality Date   AV FISTULA PLACEMENT Left 05/13/2021   Procedure: ARTERIOVENOUS (AV) FISTULA CREATION;  Surgeon: Broadus John, MD;  Location: [facility] OR;  Service: Vascular;  Laterality: Left;   IR FLUORO GUIDE CV LINE RIGHT  07/15/2021   IR US GUIDE VASC ACCESS RIGHT  07/16/2021   RIGHT/LEFT HEART CATH AND CORONARY ANGIOGRAPHY N/A 02/18/2022   Procedure: RIGHT/LEFT HEART CATH AND CORONARY ANGIOGRAPHY;  Surgeon: Adrian Prows, MD;  Location: [place] [facility];  Service: Cardiovascular;  Laterality: N/A;    Family History  Problem Relation Age of Onset   Stroke Mother    Dementia Mother     Kidney disease Mother     Social History   Socioeconomic History   Marital status: Divorced    Spouse name: Not on file   Number of children: 1   Years of education: Not on file   Highest education level: Not on file  Occupational History   Occupation: Landscaper  Tobacco Use   Smoking status: Former    Types: Cigars    Quit date: 04/2021    Years since quitting: 0.9   Smokeless tobacco: Never  Vaping Use   Vaping Use: Never used  Substance and Sexual Activity   Alcohol use: Yes    Comment: occ   Drug use: Yes    Types: Marijuana   Sexual activity: Yes  Other Topics Concern   Not on file  Social History Narrative   Works as a Development worker, 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 His father passed in March and his mother moved into a nursing facility, and unfortunately he lost ownership of his family's house so has been experiencing housing instability since early summer. Occasionally resides with friend, also gets services from [facility]. Has slept outside as well.    Social Determinants of Health   Financial Resource Strain: High Risk (05/13/2021)   Overall Financial Resource Strain (CARDIA)    Difficulty of Paying Living Expenses: Very hard  Food Insecurity: Food Insecurity Present (05/13/2021)   Hunger Vital Sign    Worried About Running Out of Food in the Last Year: Often true    Ran Out of Food in the Last Year: Often true  Transportation Needs: Unmet Transportation Needs (05/13/2021)   PRAPARE - Transportation    Lack  of Transportation (Medical): Yes    Lack of Transportation (Non-Medical): Yes  Physical Activity: Not on file  Stress: Not on file  Social Connections: Not on file  Intimate Partner Violence: Not on file    Allergies  Allergen Reactions   Nsaids Other (See Comments)    Patient was born with only 1 kidney and was told to not take NSAID(s)    Current Outpatient Medications  Medication Sig Dispense Refill   B Complex-C-Zn-Folic Acid (DIALYVITE/ZINC) TABS Take 1 tablet by  mouth in the morning.     carvedilol (COREG) 25 MG tablet Take 1 tablet (25 mg total) by mouth 2 (two) times daily with a meal. (Patient taking differently: Take 12.5 mg by mouth in the morning and at bedtime.) 60 tablet 2   ivabradine (CORLANOR) 5 MG TABS tablet Take 1 tablet (5 mg total) by mouth 2 (two) times daily with a meal. (Patient taking differently: Take 5 mg by mouth daily with supper.) 180 tablet 0   rosuvastatin (CRESTOR) 5 MG tablet Take 1 tablet (5 mg total) by mouth at bedtime. 90 tablet 0   SENSIPAR 30 MG tablet Take 30 mg by mouth every Monday, Wednesday, and Friday at 8 PM.     sucroferric oxyhydroxide (VELPHORO) 500 MG chewable tablet Chew 500 mg by mouth 3 (three) times daily with meals.     No current facility-administered medications for this visit.    PHYSICAL EXAM Vitals:   03/25/22 1010  BP: 108/84  Pulse: (!) 104  Resp: 20  Temp: 97.8 F (36.6 C)  SpO2: 100%  Weight: 144 lb (65.3 kg)  Height: 5\' 8"  (1.727 m)    Appearing man in no acute distress Regular rate and rhythm Unlabored breathing Soft nontender abdomen From both left arm brachiocephalic arteriovenous fistula  PERTINENT LABORATORY AND RADIOLOGIC DATA  Most recent CBC    Latest Ref Rng & Units 02/18/2022   12:38 PM 02/18/2022   12:33 PM 12/07/2021   12:37 PM  CBC  WBC 4.0 - 10.5 K/uL   5.0   Hemoglobin 13.0 - 17.0 g/dL 13.0 - 17.0 g/dL 10.9    10.9  10.9  11.3   Hematocrit 39.0 - 52.0 % 39.0 - 52.0 % 32.0    32.0  32.0  34.5   Platelets 150 - 400 K/uL   185      Most recent CMP    Latest Ref Rng & Units 02/18/2022   12:38 PM 02/18/2022   12:33 PM 12/07/2021   12:37 PM  CMP  Glucose 70 - 99 mg/dL   71   BUN 6 - 20 mg/dL   72   Creatinine 0.61 - 1.24 mg/dL   14.44   Sodium 135 - 145 mmol/L 135 - 145 mmol/L 118    117  127  141   Potassium 3.5 - 5.1 mmol/L 3.5 - 5.1 mmol/L 4.3    4.2  4.8  4.5   Chloride 98 - 111 mmol/L   93   CO2 22 - 32 mmol/L   27   Calcium 8.9 - 10.3 mg/dL    9.8   Total Protein 6.5 - 8.1 g/dL   8.4   Total Bilirubin 0.3 - 1.2 mg/dL   0.7   Alkaline Phos 38 - 126 U/L   86   AST 15 - 41 U/L   31   ALT 0 - 44 U/L   14     Renal function CrCl cannot be calculated (Patient's  most recent lab result is older than the maximum 21 days allowed.).  Hgb A1c MFr Bld (%)  Date Value  05/07/2021 4.6 (L)    LDL Cholesterol  Date Value Ref Range Status  05/07/2021 58 0 - 99 mg/dL Final    Comment:           Total Cholesterol/HDL:CHD Risk Coronary Heart Disease Risk Table                     Men   Women  1/2 Average Risk   3.4   3.3  Average Risk       5.0   4.4  2 X Average Risk   9.6   7.1  3 X Average Risk  23.4   11.0        Use the calculated Patient Ratio above and the CHD Risk Table to determine the patient's CHD Risk.        ATP III CLASSIFICATION (LDL):  <100     mg/dL   Optimal  100-129  mg/dL   Near or Above                    Optimal  130-159  mg/dL   Borderline  160-189  mg/dL   High  >190     mg/dL   Very High Performed at [facility] [address], [place], [place] [address]      Yevonne Aline. Stanford Breed, MD [facility] Phone Number: [phone] 03/25/2022 10:23 AM  Total time spent on preparing this encounter including chart review, data review, collecting history, examining the patient, coordinating care for this established patient, 30 minutes.  Portions of this report may have been transcribed using voice recognition software.  Every effort has been made to ensure accuracy; however, inadvertent computerized transcription errors may still be present.

## 2022-03-25 ENCOUNTER — Encounter: Payer: Self-pay | Admitting: Vascular Surgery

## 2022-03-25 ENCOUNTER — Other Ambulatory Visit: Payer: Self-pay

## 2022-03-25 ENCOUNTER — Ambulatory Visit (INDEPENDENT_AMBULATORY_CARE_PROVIDER_SITE_OTHER): Payer: Medicare Other | Admitting: Vascular Surgery

## 2022-03-25 VITALS — BP 108/84 | HR 104 | Temp 97.8°F | Resp 20 | Ht 68.0 in | Wt 144.0 lb

## 2022-03-25 DIAGNOSIS — N186 End stage renal disease: Secondary | ICD-10-CM | POA: Diagnosis not present

## 2022-03-25 DIAGNOSIS — Z992 Dependence on renal dialysis: Secondary | ICD-10-CM

## 2022-04-03 ENCOUNTER — Ambulatory Visit: Payer: Medicare Other | Admitting: Cardiology

## 2022-04-03 ENCOUNTER — Encounter: Payer: Self-pay | Admitting: Cardiology

## 2022-04-03 VITALS — BP 94/72 | HR 101 | Resp 16 | Ht 68.0 in | Wt 146.0 lb

## 2022-04-03 DIAGNOSIS — N186 End stage renal disease: Secondary | ICD-10-CM

## 2022-04-03 DIAGNOSIS — I5042 Chronic combined systolic (congestive) and diastolic (congestive) heart failure: Secondary | ICD-10-CM

## 2022-04-03 DIAGNOSIS — I130 Hypertensive heart and chronic kidney disease with heart failure and stage 1 through stage 4 chronic kidney disease, or unspecified chronic kidney disease: Secondary | ICD-10-CM

## 2022-04-03 DIAGNOSIS — I428 Other cardiomyopathies: Secondary | ICD-10-CM

## 2022-04-03 MED ORDER — CARVEDILOL 6.25 MG PO TABS: 6.2500 mg | ORAL_TABLET | Freq: Two times a day (BID) | ORAL | 3 refills | Status: DC

## 2022-04-03 NOTE — Progress Notes (Signed)
Date:  04/03/2022   ID:  Phillip Lynch, DOB 02-09-1966, MRN [id number]  PCP:  Patient, [name]  Cardiologist:  Rex Kras, DO, [facility] (established care 05/07/2021) Advanced heart failure specialist: Dr. Haroldine Laws  Date: 04/03/22 Last Office Visit: 01/31/2022  Chief Complaint  Patient presents with   Cardiomyopathy   Follow-up    HPI  Phillip Lynch is a 56 y.o. African-American male whose past medical history and cardiovascular risk factors include: Nonischemic cardiomyopathy, chronic combined systolic and diastolic heart failure, stage C, NYHA class II, influenza A infection, history of solitary kidney, hypertension with end-stage renal disease on hemodialysis, history of polysubstance abuse.   In November 2022 he presented to the ED with cough/flulike symptoms and was diagnosed with influenza A infection.  Echocardiogram was performed during the hospitalization and LVEF is severely reduced and there was concerns for possible cardiac amyloidosis.  Patient's medications were uptitrated but limited due to chronic kidney disease stage V pending hemodialysis.  He was also evaluated by advanced heart failure specialist Dr. Haroldine Laws and underwent appropriate amyloid work-up including PYP study and myeloma work-up.  It was deemed that his underlying cardiomyopathy is likely hypertensive heart disease.  Since then his medications have been uptitrated -though limited due to soft blood pressures/orthostasis.  After starting hemodialysis shared decision at the last office visit was to proceed with left and right heart catheterization to evaluate for obstructive CAD.   Left heart catheterization notes underlying cause of cardiomyopathy to be nonischemic.  His LVEF has improved compared to November 2022.  Clinically he denies anginal discomfort or heart failure symptoms.  He rides his bike to and from dialysis without exertional chest pain or shortness of breath.  He is planning to undergo  peritoneal dialysis cath placement on 04/10/2022 we will start home peritoneal dialysis in November 2023.  FUNCTIONAL STATUS: No structured exercise program or daily routine.   ALLERGIES: Allergies  Allergen Reactions   Nsaids Other (See Comments)    Patient was born with only 1 kidney and was told to not take NSAID(s)    MEDICATION LIST PRIOR TO VISIT: Current Meds  Medication Sig   carvedilol (COREG) 6.25 MG tablet Take 1 tablet (6.25 mg total) by mouth 2 (two) times daily.   SENSIPAR 30 MG tablet Take 30 mg by mouth daily with breakfast.   sucroferric oxyhydroxide (VELPHORO) 500 MG chewable tablet Chew 500 mg by mouth 3 (three) times daily with meals.   [DISCONTINUED] carvedilol (COREG) 25 MG tablet Take 1 tablet (25 mg total) by mouth 2 (two) times daily with a meal. (Patient taking differently: Take 12.5 mg by mouth in the morning and at bedtime.)     PAST MEDICAL HISTORY: Past Medical History:  Diagnosis Date   CHF (congestive heart failure) ([place])    Congenital renal agenesis, unilateral    Hypertension    Unintentional weight loss     PAST SURGICAL HISTORY: Past Surgical History:  Procedure Laterality Date   AV FISTULA PLACEMENT Left 05/13/2021   Procedure: ARTERIOVENOUS (AV) FISTULA CREATION;  Surgeon: Broadus John, MD;  Location: [place];  Service: Vascular;  Laterality: Left;   IR FLUORO GUIDE CV LINE RIGHT  07/15/2021   IR US GUIDE VASC ACCESS RIGHT  07/16/2021   RIGHT/LEFT HEART CATH AND CORONARY ANGIOGRAPHY N/A 02/18/2022   Procedure: RIGHT/LEFT HEART CATH AND CORONARY ANGIOGRAPHY;  Surgeon: Adrian Prows, MD;  Location: [place] [facility];  Service: Cardiovascular;  Laterality: N/A;    FAMILY HISTORY: The  patient family history includes Dementia in his mother; Kidney disease in his mother; Stroke in his mother.  SOCIAL HISTORY:  The patient  reports that he quit smoking about a year ago. His smoking use included cigars. He has never used smokeless tobacco. He  reports current alcohol use. He reports current drug use. Drug: Marijuana.  REVIEW OF SYSTEMS: Review of Systems  Cardiovascular:  Negative for chest pain, dyspnea on exertion, leg swelling, near-syncope, orthopnea, palpitations, paroxysmal nocturnal dyspnea and syncope.  Respiratory:  Negative for shortness of breath.     PHYSICAL EXAM:    04/03/2022    2:27 PM 03/25/2022   10:10 AM 02/18/2022    2:00 PM  Vitals with BMI  Height 5\' 8"  5\' 8"    Weight 146 lbs 144 lbs   BMI 94.8 54.6   Systolic 94 270 350  Diastolic 72 84 72  Pulse 093 104 72   Orthostatic VS for the past 72 hrs (Last 3 readings):  Orthostatic BP Patient Position BP Location Cuff Size Orthostatic Pulse  04/03/22 1434 (!) 86/61 Standing Right Arm Large 79  04/03/22 1433 114/61 Sitting Right Arm Large 106  04/03/22 1431 94/63 Supine Right Arm Large 92   Physical Exam  Constitutional: No distress.  Age appropriate, hemodynamically stable.   Eyes:  Arcus senilis  Neck: No JVD present.  Cardiovascular: Normal rate, regular rhythm, S1 normal, S2 normal, intact distal pulses and normal pulses. Exam reveals no gallop, no S3 and no S4.  No murmur heard. Pulses:      Dorsalis pedis pulses are 2+ on the right side and 2+ on the left side.       Posterior tibial pulses are 2+ on the right side and 2+ on the left side.  Pulmonary/Chest: Effort normal and breath sounds normal. No stridor. He has no wheezes. He has no rales.  Dialysis catheter right anterior chest wall  Abdominal: Soft. Bowel sounds are normal. He exhibits no distension. There is no abdominal tenderness.  Musculoskeletal:        General: No edema.     Cervical back: Neck supple.  Neurological: He is alert and oriented to person, place, and time. He has intact cranial nerves (2-12).  Skin: Skin is warm and moist.   CARDIAC DATABASE: EKG: 06/18/2021: Sinus tachycardia, 104 bpm, normal axis, without underlying ischemia or injury pattern.  12/25/2021:  Ectopic atrial rhythm, 99bpm, without underlying injury pattern.   Echocardiogram: 05/07/2021:  1. Left ventricular ejection fraction, by estimation, is 20 to 25%. The  left ventricle has severely decreased function. The left ventricle  demonstrates global hypokinesis. The left ventricular internal cavity size  was mildly dilated. There is moderate  left ventricular hypertrophy. Left ventricular diastolic parameters are  consistent with Grade I diastolic dysfunction (impaired relaxation). The  average left ventricular global longitudinal strain is -9.0 %. The global  longitudinal strain is abnormal.  Strain is relatively preserved at the apical cap. Bright myocardium with  LVH and relatively preserved apical strain suggests the possibility of  cardiac amyloidosis.   2. Right ventricular systolic function is normal. The right ventricular  size is normal. There is normal pulmonary artery systolic pressure. The  estimated right ventricular systolic pressure is 81.8 mmHg.   3. The mitral valve is normal in structure. Trivial mitral valve  regurgitation. No evidence of mitral stenosis.   4. The aortic valve is tricuspid. Aortic valve regurgitation is mild. No  aortic stenosis is present.   5. Aortic dilatation  noted. 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   6. The inferior vena cava is normal in size with greater than 50%  respiratory variability, suggesting right atrial pressure of 3 mmHg.    Stress Testing: No results found for this or any previous visit from the past 1095 days.  Heart Catheterization: 02/18/2022:   There is mild left ventricular systolic dysfunction.   LV end diastolic pressure is normal.   The left ventricular ejection fraction is 45-50% by visual estimate.   There is no mitral valve regurgitation.   Right & Left Heart Catheterization 02/18/22:  LV: 147/1, EDP 11 mmHg.  Ao 145/82, mean 112 mmHg.  No pressure gradient across the aortic  valve. LVEF 45% with global hypokinesis.  No significant mitral regurgitation. LM: Large-caliber vessel, normal. LAD: Gives origin to large D1 and D2.  The LAD wraps around the apex and supplies most of the posterior wall, type IV LAD.  It is smooth and normal. LCx: Moderate caliber vessel, gives origin to small PDA branch.  2 small marginal branches.  Smooth and normal. RI: Moderate caliber vessel, smooth and normal.   RA 13/12, mean 12 mmHg RV 29/7, EDP 12 mmHg PA 29/12, mean 24 mmHg. PW 24/21, mean 18 mmHg.  Ao saturation 97%, PA saturation 69%.  DP/+1.00 and CO 5.31, CI 3.01, normal.   Impression: Findings consistent with nonischemic cardiomyopathy with mild LV systolic dysfunction.  Preserved cardiac output and cardiac index without evidence of pulmonary hypertension.  No intracardiac shunting.  35 mill contrast utilized.   PYP Study  05/10/2021: Visual and quantitative assessment (grade 1, H/CLL equal 1.22) are equivocally suggestive of transthyretin amyloidosis.  LABORATORY DATA:    Latest Ref Rng & Units 02/18/2022   12:38 PM 02/18/2022   12:33 PM 12/07/2021   12:37 PM  CBC  WBC 4.0 - 10.5 K/uL   5.0   Hemoglobin 13.0 - 17.0 g/dL 13.0 - 17.0 g/dL 10.9    10.9  10.9  11.3   Hematocrit 39.0 - 52.0 % 39.0 - 52.0 % 32.0    32.0  32.0  34.5   Platelets 150 - 400 K/uL   185        Latest Ref Rng & Units 02/18/2022   12:38 PM 02/18/2022   12:33 PM 12/07/2021   12:37 PM  CMP  Glucose 70 - 99 mg/dL   71   BUN 6 - 20 mg/dL   72   Creatinine 0.61 - 1.24 mg/dL   14.44   Sodium 135 - 145 mmol/L 135 - 145 mmol/L 118    117  127  141   Potassium 3.5 - 5.1 mmol/L 3.5 - 5.1 mmol/L 4.3    4.2  4.8  4.5   Chloride 98 - 111 mmol/L   93   CO2 22 - 32 mmol/L   27   Calcium 8.9 - 10.3 mg/dL   9.8   Total Protein 6.5 - 8.1 g/dL   8.4   Total Bilirubin 0.3 - 1.2 mg/dL   0.7   Alkaline Phos 38 - 126 U/L   86   AST 15 - 41 U/L   31   ALT 0 - 44 U/L   14     Lipid Panel     Component  Value Date/Time   CHOL 151 05/07/2021 1644   TRIG 135 05/07/2021 1644   HDL 66 05/07/2021 1644   CHOLHDL 2.3 05/07/2021 1644   VLDL 27  05/07/2021 1644   [place] 58 05/07/2021 1644    No components found for: "NTPROBNP" No results for input(s): "PROBNP" in the last 8760 hours. Recent Labs    05/07/21 1126  TSH 1.142    BMP Recent Labs    05/23/21 1112 10/15/21 1044 12/07/21 1237 02/18/22 1233 02/18/22 1238  NA 138 138 141 127* 117*  118*  K 5.7* 4.7 4.5 4.8 4.2  4.3  CL 110 105 93*  --   --   CO2 19* 23 27  --   --   GLUCOSE 105* 89 71  --   --   BUN 60* 49* 72*  --   --   CREATININE 6.13* 17.19* 14.44*  --   --   CALCIUM 8.2* 8.5* 9.8  --   --   GFRNONAA 10* 3* 4*  --   --     HEMOGLOBIN A1C Lab Results  Component Value Date   HGBA1C 4.6 (L) 05/07/2021   MPG 85.32 05/07/2021    IMPRESSION:    ICD-10-CM   1. Chronic combined systolic and diastolic heart failure (HCC)  I50.42 carvedilol (COREG) 6.25 MG tablet    PCV ECHOCARDIOGRAM COMPLETE    2. Nonischemic cardiomyopathy (HCC)  I42.8 carvedilol (COREG) 6.25 MG tablet    PCV ECHOCARDIOGRAM COMPLETE    3. 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 ESRD (end stage renal disease) on dialysis [facility])  N18.6    Z99.2        RECOMMENDATIONS: Phillip Lynch is a 56 y.o. African-American male whose past medical history and cardiac risk factors include: Chronic combined systolic and diastolic heart failure, stage C, NYHA class II, influenza A infection, history of solitary kidney, hypertension with end-stage renal disease on hemodialysis, history of polysubstance abuse.   Chronic combined systolic and diastolic heart failure (HCC) /Nonischemic cardiomyopathy Likely secondary to hypertensive heart disease. Stage C, NYHA class II Reviewed the results of the left and right heart catheterization. Patient is not able to tolerate Corlanor or BiDil in the last. On the current dose of  carvedilol 12.5 mg p.o. twice daily he is still orthostatic positive. We will reduce the dose of carvedilol to 6.25 mg p.o. twice daily. I requested that he speak to his nephrologist with regards to the use of losartan or Entresto given his renal function and progressing to peritoneal dialysis. Echo will be ordered to evaluate for structural heart disease and left ventricular systolic function. We will slowly uptitrate GDMT.  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 Medication changes as noted above. Orthostatic vital signs positive. Unable to uptitrate GDMT due to low blood pressures/symptomatic orthostatic hypotension and ESRD. Monitor for now  FINAL MEDICATION LIST END OF ENCOUNTER: Meds ordered this encounter  Medications   carvedilol (COREG) 6.25 MG tablet    Sig: Take 1 tablet (6.25 mg total) by mouth 2 (two) times daily.    Dispense:  180 tablet    Refill:  3    Medications Discontinued During This Encounter  Medication Reason   B Complex-C-Zn-Folic Acid (DIALYVITE/ZINC) TABS    rosuvastatin (CRESTOR) 5 MG tablet    ivabradine (CORLANOR) 5 MG TABS tablet    carvedilol (COREG) 25 MG tablet Dose change     Current Outpatient Medications:    carvedilol (COREG) 6.25 MG tablet, Take 1 tablet (6.25 mg total) by mouth 2 (  two) times daily., Disp: 180 tablet, Rfl: 3   SENSIPAR 30 MG tablet, Take 30 mg by mouth daily with breakfast., Disp: , Rfl:    sucroferric oxyhydroxide (VELPHORO) 500 MG chewable tablet, Chew 500 mg by mouth 3 (three) times daily with meals., Disp: , Rfl:    B Complex-C-Folic Acid (DIALYVITE 346) 0.8 MG TABS, Take 1 tablet by mouth daily., Disp: , Rfl:   Orders Placed This Encounter  Procedures   PCV ECHOCARDIOGRAM COMPLETE    There are no Patient Instructions on file for this visit.   --Continue cardiac medications as reconciled in final medication list. --Return in  about 3 months (around 07/04/2022) for Follow up, heart failure management.. Or sooner if needed. --Continue follow-up with your primary care physician regarding the management of your other chronic comorbid conditions.  Patient's questions and concerns were addressed to his satisfaction. He voices understanding of the instructions provided during this encounter.   This note was created using a voice recognition software as a result there may be grammatical errors inadvertently enclosed that do not reflect the nature of this encounter. Every attempt is made to correct such errors.  Rex Kras, [place], [facility]  Pager: [phone] Office: [phone]

## 2022-04-03 NOTE — Progress Notes (Signed)
External Labs: Collected: 01/30/2022. Hemoglobin 11.6 g/dL, hematocrit 34.8%. Creatinine 14.16  Collected: 01/16/2022. Hemoglobin 10.7.

## 2022-04-09 ENCOUNTER — Other Ambulatory Visit: Payer: Self-pay

## 2022-04-09 ENCOUNTER — Encounter (HOSPITAL_COMMUNITY): Payer: Self-pay | Admitting: Vascular Surgery

## 2022-04-09 NOTE — Progress Notes (Signed)
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  Recent right/left heart cath showed EF improved to 45 to 50%, normal coronaries.  Last seen by Dr. Terri Skains on 04/03/2022.  Use of GDMT has been limited by soft blood pressures.  He was unable to tolerate Corlanor or BiDil.  His carvedilol was reduced to 6.25 mg twice daily as he was orthostatic on higher doses.  Currently dialyzing Monday Wednesday Friday through right IJ tunneled dialysis catheter.  Sodium noted to be critically low prior to cardiac catheterization on 02/18/2022.  Subsequent labs in Care Everywhere from dialysis center on 04/02/2022 showed sodium normal at 138.  Patient will need day of surgery labs and evaluation.  12/25/2021: Ectopic atrial rhythm, 99bpm, without underlying injury pattern.   Heart Catheterization: 02/18/2022:   There is mild left ventricular systolic dysfunction.   LV end diastolic pressure is normal.   The left ventricular ejection fraction is 45-50% by visual estimate.   There is no mitral valve regurgitation.   Right & Left Heart Catheterization 02/18/22:  LV: 147/1, EDP 11 mmHg.  Ao 145/82, mean 112 mmHg.  No pressure gradient across the aortic valve. LVEF 45% with global hypokinesis.  No significant mitral regurgitation. LM: Large-caliber vessel, normal. LAD: Gives origin to large D1 and D2.  The LAD wraps around the apex and supplies most of the posterior wall, type IV LAD.  It is smooth and normal. LCx: Moderate caliber vessel, gives origin to small PDA branch.  2 small marginal branches.  Smooth and normal. RI: Moderate caliber vessel, smooth and normal.   RA 13/12, mean 12 mmHg RV 29/7, EDP 12 mmHg PA 29/12, mean 24 mmHg. PW 24/21, mean 18 mmHg.  Ao saturation 97%, PA saturation 69%.  DP/+1.00 and CO 5.31, CI 3.01, normal.    Impression: Findings consistent with nonischemic cardiomyopathy with mild LV systolic dysfunction.  Preserved cardiac output and cardiac index without evidence of pulmonary hypertension.  No intracardiac shunting.  35 mill contrast utilized.  TTE 11/29/2021:  1. Left ventricular ejection fraction, by estimation, is 30 to 35%. The  left ventricle has moderately decreased function. The left ventricle  demonstrates global hypokinesis. There is moderate left ventricular  hypertrophy. Left ventricular diastolic  function could not be evaluated.   2. Right ventricular systolic function is normal. The right ventricular  size is normal. There is normal pulmonary artery systolic pressure.   3. Left atrial size was mildly dilated.   4. The mitral valve is normal in structure. No evidence of mitral valve  regurgitation. No evidence of mitral stenosis.   5. The aortic valve is tricuspid. Aortic valve regurgitation is mild. No  aortic stenosis is present.   6. The inferior vena cava is normal in size with greater than 50%  respiratory variability, suggesting right atrial pressure of 3 mmHg.   7. 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     Wynonia Musty [facility] Short Stay Center/Anesthesiology Phone [phone] 04/09/2022 12:38 PM

## 2022-04-09 NOTE — Progress Notes (Signed)
PCP - Shon Baton, MD Cardiologist - Rex Kras, MD  PPM/ICD - denies  Chest x-ray - -12/07/21 EKG - 12/25/21 ECHO - 11/29/21 Cardiac Cath - 02/18/22  CPAP - n/a  Fasting Blood Sugar - n/a  Blood Thinner Instructions: n/a Aspirin Instructions: Patient was instructed: As of today, STOP taking any Aspirin (unless otherwise instructed by your surgeon) Aleve, Naproxen, Ibuprofen, Motrin, Advil, Goody's, BC's, all herbal medications, fish oil, and all vitamins.   ERAS Protcol - n/a  COVID TEST- n/a  Anesthesia review: yes - cardiac history  Patient verbally denies any shortness of breath, fever, cough and chest pain during phone call   -------------  SDW INSTRUCTIONS given:  Your procedure is scheduled on Thursday, October 26th, 2023.  Report to [facility] Main Entrance "A" at 10:30 A.M., and check in at the Admitting office.  Call this number if you have problems the morning of surgery:  [phone]   Remember:  Do not eat or drink fter midnight the night before your surgery    Take these medicines the morning of surgery with A SIP OF WATER Coreg   The day of surgery:                     Do not wear jewelry,             Do not wear lotions, powders, colognes, or deodorant.            Men may shave face and neck.            Do not bring valuables to the hospital.            [facility] [place] is not responsible for any belongings or valuables.  Do NOT Smoke (Tobacco/Vaping) 24 hours prior to your procedure If you use a CPAP at night, you may bring all equipment for your overnight stay.   Contacts, glasses, dentures or bridgework may not be worn into surgery.      For patients admitted to the hospital, discharge time will be determined by your treatment team.   Patients discharged the day of surgery will not be allowed to drive home, and someone needs to stay with them for 24 hours.    Special instructions:   [facility]- Preparing For Surgery  Before surgery, you  can play an important role. Because skin is not sterile, your skin needs to be as free of germs as possible. You can reduce the number of germs on your skin by washing with CHG (chlorahexidine gluconate) Soap before surgery.  CHG is an antiseptic cleaner which kills germs and bonds with the skin to continue killing germs even after washing.    Oral Hygiene is also important to reduce your risk of infection.  Remember - BRUSH YOUR TEETH THE MORNING OF SURGERY WITH YOUR REGULAR TOOTHPASTE  Please do not use if you have an allergy to CHG or antibacterial soaps. If your skin becomes reddened/irritated stop using the CHG.  Do not shave (including legs and underarms) for at least 48 hours prior to first CHG shower. It is OK to shave your face.  Please follow these instructions carefully.   Shower the NIGHT BEFORE SURGERY and the MORNING OF SURGERY with DIAL Soap.   Pat yourself dry with a CLEAN TOWEL.  Wear CLEAN PAJAMAS to bed the night before surgery  Place CLEAN SHEETS on your bed the night of your first shower and DO NOT SLEEP WITH PETS.   Day of  Surgery: Please shower morning of surgery  Wear Clean/Comfortable clothing the morning of surgery Do not apply any deodorants/lotions.   Remember to brush your teeth WITH YOUR REGULAR TOOTHPASTE.   Questions were answered. Patient verbalized understanding of instructions.

## 2022-04-09 NOTE — Anesthesia Preprocedure Evaluation (Signed)
Anesthesia Evaluation  Patient identified by MRN, date of birth, ID band Patient awake    Reviewed: Allergy & Precautions, NPO status , Patient's Chart, lab work & pertinent test results, reviewed documented beta blocker date and time   History of Anesthesia Complications Negative for: history of anesthetic complications  Airway Mallampati: I  TM Distance: >3 FB Neck ROM: Full    Dental  (+) Dental Advisory Given, Chipped   Pulmonary former smoker,    Pulmonary exam normal        Cardiovascular hypertension, Pt. on home beta blockers and Pt. on medications (-) angina+CHF  Normal cardiovascular exam   '23 Cath - normal coronary arteries. EF 45-50% (improved from TTE below)  '23 TTE - EF 30 to 35%. Global hypokinesis. 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 Left atrial size was mildly dilated. Aortic valve regurgitation is mild.     Neuro/Psych negative neurological ROS  negative psych ROS   GI/Hepatic negative GI ROS, (+)     substance abuse  ,   Endo/Other  negative endocrine ROS  Renal/GU ESRFRenal disease Unilateral renal agenesis      Musculoskeletal negative musculoskeletal ROS (+)   Abdominal   Peds  Hematology negative hematology ROS (+)   Anesthesia Other Findings   Reproductive/Obstetrics                           Anesthesia Physical Anesthesia Plan  ASA: 4  Anesthesia Plan: General   Post-op Pain Management: Tylenol PO (pre-op)*   Induction: Intravenous  PONV Risk Score and Plan: 2 and Treatment may vary due to age or medical condition, Ondansetron, Dexamethasone and Midazolam  Airway Management Planned: Oral ETT  Additional Equipment: None  Intra-op Plan:   Post-operative Plan: Extubation in OR  Informed Consent: I have reviewed the patients History and Physical, chart, labs and discussed the procedure  including the risks, benefits and alternatives for the proposed anesthesia with the patient or authorized representative who has indicated his/her understanding and acceptance.     Dental advisory given  Plan Discussed with: CRNA and Anesthesiologist  Anesthesia Plan Comments: ( )      Anesthesia Quick Evaluation

## 2022-04-10 ENCOUNTER — Other Ambulatory Visit: Payer: Self-pay

## 2022-04-10 ENCOUNTER — Ambulatory Visit (HOSPITAL_COMMUNITY)
Admission: RE | Admit: 2022-04-10 | Discharge: 2022-04-10 | Disposition: A | Discharge status: Home / Self Care | Payer: Medicare Other | Attending: Vascular Surgery | Admitting: Vascular Surgery

## 2022-04-10 ENCOUNTER — Ambulatory Visit (HOSPITAL_BASED_OUTPATIENT_CLINIC_OR_DEPARTMENT_OTHER): Payer: Medicare Other | Admitting: Physician Assistant

## 2022-04-10 ENCOUNTER — Encounter (HOSPITAL_COMMUNITY)
Admission: RE | Disposition: A | Discharge status: Home / Self Care | Payer: Self-pay | Source: Home / Self Care | Attending: Vascular Surgery

## 2022-04-10 ENCOUNTER — Ambulatory Visit (HOSPITAL_COMMUNITY): Payer: Medicare Other | Admitting: Physician Assistant

## 2022-04-10 ENCOUNTER — Encounter (HOSPITAL_COMMUNITY): Payer: Self-pay | Admitting: Vascular Surgery

## 2022-04-10 DIAGNOSIS — Z992 Dependence on renal dialysis: Secondary | ICD-10-CM | POA: Insufficient documentation

## 2022-04-10 DIAGNOSIS — Z87891 Personal history of nicotine dependence: Secondary | ICD-10-CM

## 2022-04-10 DIAGNOSIS — N186 End stage renal disease: Secondary | ICD-10-CM

## 2022-04-10 DIAGNOSIS — Q600 Renal agenesis, unilateral: Secondary | ICD-10-CM | POA: Diagnosis not present

## 2022-04-10 DIAGNOSIS — I132 Hypertensive heart and chronic kidney disease with heart failure and with stage 5 chronic kidney disease, or end stage renal disease: Secondary | ICD-10-CM

## 2022-04-10 DIAGNOSIS — Z79899 Other long term (current) drug therapy: Secondary | ICD-10-CM | POA: Diagnosis not present

## 2022-04-10 DIAGNOSIS — I509 Heart failure, unspecified: Secondary | ICD-10-CM

## 2022-04-10 HISTORY — PX: CAPD INSERTION: SHX5233

## 2022-04-10 LAB — POCT I-STAT, CHEM 8
BUN: 35 mg/dL — ABNORMAL HIGH (ref 6–20)
Calcium, Ion: 0.88 mmol/L — CL (ref 1.15–1.40)
Chloride: 98 mmol/L (ref 98–111)
Creatinine, Ser: 11.4 mg/dL — ABNORMAL HIGH (ref 0.61–1.24)
Glucose, Bld: 74 mg/dL (ref 70–99)
HCT: 40 % (ref 39.0–52.0)
Hemoglobin: 13.6 g/dL (ref 13.0–17.0)
Potassium: 4 mmol/L (ref 3.5–5.1)
Sodium: 135 mmol/L (ref 135–145)
TCO2: 27 mmol/L (ref 22–32)

## 2022-04-10 SURGERY — LAPAROSCOPIC INSERTION CONTINUOUS AMBULATORY PERITONEAL DIALYSIS  (CAPD) CATHETER
Anesthesia: General

## 2022-04-10 MED ORDER — SODIUM CHLORIDE 0.9 % IR SOLN
Status: DC | PRN
  Administered 2022-04-10: 1

## 2022-04-10 MED ORDER — OXYCODONE HCL 5 MG/5ML PO SOLN: 5.0000 mg | Freq: Once | ORAL | Status: AC | PRN

## 2022-04-10 MED ORDER — LIDOCAINE 2% (20 MG/ML) 5 ML SYRINGE
INTRAMUSCULAR | Status: AC
  Filled 2022-04-10: qty 5

## 2022-04-10 MED ORDER — CHLORHEXIDINE GLUCONATE 4 % EX LIQD: 60.0000 mL | Freq: Once | CUTANEOUS | Status: DC

## 2022-04-10 MED ORDER — DEXAMETHASONE SODIUM PHOSPHATE 10 MG/ML IJ SOLN
INTRAMUSCULAR | Status: AC
  Filled 2022-04-10: qty 1

## 2022-04-10 MED ORDER — EPHEDRINE SULFATE-NACL 50-0.9 MG/10ML-% IV SOSY
PREFILLED_SYRINGE | INTRAVENOUS | Status: DC | PRN
  Administered 2022-04-10 (×2): 5 mg via INTRAVENOUS

## 2022-04-10 MED ORDER — FENTANYL CITRATE (PF) 250 MCG/5ML IJ SOLN
INTRAMUSCULAR | Status: DC | PRN
  Administered 2022-04-10: 25 ug via INTRAVENOUS
  Administered 2022-04-10 (×2): 50 ug via INTRAVENOUS

## 2022-04-10 MED ORDER — DEXMEDETOMIDINE HCL IN NACL 80 MCG/20ML IV SOLN
INTRAVENOUS | Status: DC | PRN
  Administered 2022-04-10: 4 ug via BUCCAL
  Administered 2022-04-10: 8 ug via BUCCAL

## 2022-04-10 MED ORDER — CHLORHEXIDINE GLUCONATE 0.12 % MT SOLN
OROMUCOSAL | Status: AC
  Administered 2022-04-10: 15 mL via OROMUCOSAL
  Filled 2022-04-10: qty 15

## 2022-04-10 MED ORDER — ONDANSETRON HCL 4 MG/2ML IJ SOLN
INTRAMUSCULAR | Status: AC
  Filled 2022-04-10: qty 2

## 2022-04-10 MED ORDER — PHENYLEPHRINE 80 MCG/ML (10ML) SYRINGE FOR IV PUSH (FOR BLOOD PRESSURE SUPPORT)
PREFILLED_SYRINGE | INTRAVENOUS | Status: DC | PRN
  Administered 2022-04-10: 160 ug via INTRAVENOUS

## 2022-04-10 MED ORDER — FENTANYL CITRATE (PF) 100 MCG/2ML IJ SOLN
25.0000 ug | INTRAMUSCULAR | Status: DC | PRN
  Administered 2022-04-10 (×2): 50 ug via INTRAVENOUS

## 2022-04-10 MED ORDER — SUGAMMADEX SODIUM 200 MG/2ML IV SOLN
INTRAVENOUS | Status: DC | PRN
  Administered 2022-04-10: 200 mg via INTRAVENOUS

## 2022-04-10 MED ORDER — OXYCODONE HCL 5 MG PO TABS
5.0000 mg | ORAL_TABLET | Freq: Once | ORAL | Status: AC | PRN
  Administered 2022-04-10: 5 mg via ORAL

## 2022-04-10 MED ORDER — LIDOCAINE 2% (20 MG/ML) 5 ML SYRINGE
INTRAMUSCULAR | Status: DC | PRN
  Administered 2022-04-10: 60 mg via INTRAVENOUS

## 2022-04-10 MED ORDER — ACETAMINOPHEN 500 MG PO TABS
1000.0000 mg | ORAL_TABLET | Freq: Once | ORAL | Status: AC
Start: 2022-04-10 — End: 2022-04-10

## 2022-04-10 MED ORDER — FENTANYL CITRATE (PF) 100 MCG/2ML IJ SOLN
INTRAMUSCULAR | Status: AC
  Filled 2022-04-10: qty 2

## 2022-04-10 MED ORDER — PROMETHAZINE HCL 25 MG/ML IJ SOLN: 6.2500 mg | INTRAMUSCULAR | Status: DC | PRN

## 2022-04-10 MED ORDER — ROCURONIUM BROMIDE 10 MG/ML (PF) SYRINGE
PREFILLED_SYRINGE | INTRAVENOUS | Status: AC
  Filled 2022-04-10: qty 10

## 2022-04-10 MED ORDER — ONDANSETRON HCL 4 MG/2ML IJ SOLN
INTRAMUSCULAR | Status: DC | PRN
  Administered 2022-04-10: 4 mg via INTRAVENOUS

## 2022-04-10 MED ORDER — PROPOFOL 10 MG/ML IV BOLUS
INTRAVENOUS | Status: AC
  Filled 2022-04-10: qty 20

## 2022-04-10 MED ORDER — SODIUM CHLORIDE 0.9 % IV SOLN: INTRAVENOUS | Status: DC

## 2022-04-10 MED ORDER — PROPOFOL 10 MG/ML IV BOLUS
INTRAVENOUS | Status: DC | PRN
  Administered 2022-04-10: 100 mg via INTRAVENOUS
  Administered 2022-04-10 (×2): 30 mg via INTRAVENOUS

## 2022-04-10 MED ORDER — ORAL CARE MOUTH RINSE: 15.0000 mL | Freq: Once | OROMUCOSAL | Status: AC

## 2022-04-10 MED ORDER — CEFAZOLIN SODIUM-DEXTROSE 2-4 GM/100ML-% IV SOLN
2.0000 g | INTRAVENOUS | Status: AC
  Administered 2022-04-10: 2 g via INTRAVENOUS

## 2022-04-10 MED ORDER — HYDROCODONE-ACETAMINOPHEN 5-325 MG PO TABS: 1.0000 | ORAL_TABLET | Freq: Four times a day (QID) | ORAL | 0 refills | Status: DC | PRN

## 2022-04-10 MED ORDER — CHLORHEXIDINE GLUCONATE 0.12 % MT SOLN: 15.0000 mL | Freq: Once | OROMUCOSAL | Status: AC

## 2022-04-10 MED ORDER — ACETAMINOPHEN 500 MG PO TABS
ORAL_TABLET | ORAL | Status: AC
  Administered 2022-04-10: 1000 mg via ORAL
  Filled 2022-04-10: qty 2

## 2022-04-10 MED ORDER — 0.9 % SODIUM CHLORIDE (POUR BTL) OPTIME
TOPICAL | Status: DC | PRN
  Administered 2022-04-10: 1000 mL

## 2022-04-10 MED ORDER — FENTANYL CITRATE (PF) 250 MCG/5ML IJ SOLN
INTRAMUSCULAR | Status: AC
  Filled 2022-04-10: qty 5

## 2022-04-10 MED ORDER — CEFAZOLIN SODIUM-DEXTROSE 2-4 GM/100ML-% IV SOLN
INTRAVENOUS | Status: AC
  Filled 2022-04-10: qty 100

## 2022-04-10 MED ORDER — ROCURONIUM BROMIDE 10 MG/ML (PF) SYRINGE
PREFILLED_SYRINGE | INTRAVENOUS | Status: DC | PRN
  Administered 2022-04-10: 50 mg via INTRAVENOUS

## 2022-04-10 MED ORDER — OXYCODONE HCL 5 MG PO TABS
ORAL_TABLET | ORAL | Status: AC
  Filled 2022-04-10: qty 1

## 2022-04-10 MED ORDER — DEXAMETHASONE SODIUM PHOSPHATE 10 MG/ML IJ SOLN
INTRAMUSCULAR | Status: DC | PRN
  Administered 2022-04-10: 5 mg via INTRAVENOUS

## 2022-04-10 SURGICAL SUPPLY — 49 items
ADAPTER TITANIUM MEDIONICS (MISCELLANEOUS) ×1 IMPLANT
BAG DECANTER FOR FLEXI CONT (MISCELLANEOUS) ×1 IMPLANT
BENZOIN TINCTURE PRP APPL 2/3 (GAUZE/BANDAGES/DRESSINGS) ×1 IMPLANT
BIOPATCH RED 1 DISK 7.0 (GAUZE/BANDAGES/DRESSINGS) ×1 IMPLANT
BLADE CLIPPER SURG (BLADE) IMPLANT
BLADE SURG 11 STRL SS (BLADE) ×1 IMPLANT
CATH EXTENDED DIALYSIS (CATHETERS) IMPLANT
CHLORAPREP W/TINT 26 (MISCELLANEOUS) ×1 IMPLANT
COVER SURGICAL LIGHT HANDLE (MISCELLANEOUS) ×1 IMPLANT
DERMABOND ADVANCED .7 DNX12 (GAUZE/BANDAGES/DRESSINGS) IMPLANT
DRSG TEGADERM 4X4.75 (GAUZE/BANDAGES/DRESSINGS) IMPLANT
ELECT REM PT RETURN 9FT ADLT (ELECTROSURGICAL) ×1
ELECTRODE REM PT RTRN 9FT ADLT (ELECTROSURGICAL) ×1 IMPLANT
GAUZE SPONGE 4X4 12PLY STRL (GAUZE/BANDAGES/DRESSINGS) ×1 IMPLANT
GLOVE BIO SURGEON STRL SZ8 (GLOVE) ×1 IMPLANT
GLOVE INDICATOR 6.5 STRL GRN (GLOVE) ×1 IMPLANT
GOWN STRL REUS W/ TWL LRG LVL3 (GOWN DISPOSABLE) ×3 IMPLANT
GOWN STRL REUS W/ TWL XL LVL3 (GOWN DISPOSABLE) ×1 IMPLANT
GOWN STRL REUS W/TWL LRG LVL3 (GOWN DISPOSABLE) ×3
GOWN STRL REUS W/TWL XL LVL3 (GOWN DISPOSABLE) ×1
GRASPER SUT TROCAR 14GX15 (MISCELLANEOUS) ×1 IMPLANT
IV NS 1000ML (IV SOLUTION) ×1
IV NS 1000ML BAXH (IV SOLUTION) ×1 IMPLANT
KIT BASIN OR (CUSTOM PROCEDURE TRAY) ×1 IMPLANT
KIT TURNOVER KIT B (KITS) ×1 IMPLANT
NDL INSUFFLATION 14GA 120MM (NEEDLE) ×1 IMPLANT
NEEDLE INSUFFLATION 14GA 120MM (NEEDLE) ×1 IMPLANT
NS IRRIG 1000ML POUR BTL (IV SOLUTION) ×1 IMPLANT
PAD ARMBOARD 7.5X6 YLW CONV (MISCELLANEOUS) ×2 IMPLANT
SCISSORS LAP 5X35 DISP (ENDOMECHANICALS) IMPLANT
SET CYSTO W/LG BORE CLAMP LF (SET/KITS/TRAYS/PACK) ×1 IMPLANT
SET TUBE SMOKE EVAC HIGH FLOW (TUBING) ×1 IMPLANT
SLEEVE ENDOPATH XCEL 5M (ENDOMECHANICALS) ×1 IMPLANT
SPIKE FLUID TRANSFER (MISCELLANEOUS) ×1 IMPLANT
STRIP CLOSURE SKIN 1/2X4 (GAUZE/BANDAGES/DRESSINGS) ×2 IMPLANT
STYLET FALLER (MISCELLANEOUS) ×1 IMPLANT
SUT MNCRL AB 4-0 PS2 18 (SUTURE) ×2 IMPLANT
SUT PROLENE 0 SH 30 (SUTURE) ×2 IMPLANT
SUT VIC AB 3-0 SH 27 (SUTURE)
SUT VIC AB 3-0 SH 27X BRD (SUTURE) IMPLANT
SYR 20CC LL (SYRINGE) ×1 IMPLANT
TAPE CLOTH SURG 4X10 WHT LF (GAUZE/BANDAGES/DRESSINGS) ×1 IMPLANT
TOWEL GREEN STERILE (TOWEL DISPOSABLE) ×1 IMPLANT
TOWEL GREEN STERILE FF (TOWEL DISPOSABLE) ×1 IMPLANT
TRAY LAPAROSCOPIC MC (CUSTOM PROCEDURE TRAY) ×1 IMPLANT
TROCAR 5M 150ML BLDLS (TROCAR) IMPLANT
TROCAR 5MMX150MM (TROCAR) ×1 IMPLANT
TROCAR XCEL NON-BLD 5MMX100MML (ENDOMECHANICALS) ×1 IMPLANT
WATER STERILE IRR 1000ML POUR (IV SOLUTION) ×1 IMPLANT

## 2022-04-10 NOTE — Op Note (Signed)
DATE OF SERVICE: 04/10/2022  PATIENT:  Phillip Lynch  56 y.o. male  PRE-OPERATIVE DIAGNOSIS:  ESRD  POST-OPERATIVE DIAGNOSIS:  Same  PROCEDURE:   1) laparoscopic omentopexy 2) laparoscopic peritoneal dialysis catheter placement  SURGEON:  Surgeon(s) and Role:    * Cherre Robins, MD - Primary  ASSISTANT: Arlee Muslim, PA-C  An experienced assistant was required given the complexity of this procedure and the standard of surgical care. My assistant helped with exposure through counter tension, suctioning, ligation and retraction to better visualize the surgical field.  My assistant expedited sewing during the case by following my sutures. Wherever I use the term "we" in the report, my assistant actively helped me with that portion of the procedure.  ANESTHESIA:   general  EBL: minimal  BLOOD ADMINISTERED:none  DRAINS: none   LOCAL MEDICATIONS USED:  NONE  SPECIMEN:  none  COUNTS: confirmed correct.  TOURNIQUET:  none  PATIENT DISPOSITION:  PACU - hemodynamically stable.   Delay start of Pharmacological VTE agent (>24hrs) due to surgical blood loss or risk of bleeding: no  INDICATION FOR PROCEDURE: Phillip Lynch is a 56 y.o. male with ESRD. He desires transition to peritoneal dialysis. After careful discussion of risks, benefits, and alternatives the patient was offered laparoscopic peritoneal dialysis catheter placement. The patient understood and wished to proceed.  OPERATIVE FINDINGS: Unremarkable laparoscopic PD catheter placement.  Unremarkable omentopexy.  Catheter working well at completion of case.  DESCRIPTION OF PROCEDURE: After identification of the patient in the pre-operative holding area, the patient was transferred to the operating room. The patient was positioned supine on the operating room table. Anesthesia was induced. The abdomen was prepped and draped in standard fashion. A surgical pause was performed confirming correct patient, procedure, and operative  location.  A Veress needle was introduced into the abdomen at Sonier's point, immediately below the left costal margin.  A saline drop test was used to confirm intra-abdominal position.  The Veress needle was connected to insufflation tubing and insufflation initiated.  A low opening pressure and good flow rate were noted.  A 5 mm trocar was introduced into the right upper quadrant using Visi-View technique.  The obturator was removed and the abdomen inspected with a 30 degree angled laparoscope.  No evidence of Veress needle injury was noted.  An additional 5 mm trocar was inserted a handsbreadth away to facilitate the case.  The omentum was grasped with an atraumatic grasper and elevated to the upper abdomen.  A laparoscopic suture passer was used to deliver a 0 Prolene suture through the omentum.  The suture was secured down to the anterior abdominal wall with a knot.  The omentum was pexied in place.  The abdomen was desufflated.  The peritoneal catheter was measured across the abdominal wall surface.  A mark was made by the cuff in the periumbilical abdomen.  The abdomen was reinsufflated.  An advantage 5 mm trocar was then used to create a skiving path through the anterior rectus fascia, rectus muscle, and peritoneum.  The laparoscopic trocar entered in the suprapubic abdomen directing towards the pelvis.  The working end of the catheter was delivered through the trocar.  The cuff was brought into the abdomen and then placed back into the rectus muscle.  The abdomen was desufflated again.  A "swan-neck" extension tubing for the dialysis catheter was brought onto the field.  The catheter was laid across the desufflated abdomen to lay across the upper quadrant and exit the  abdomen.  The catheter was cut.  The 2 pieces of catheter were then [facility] using a Christmas tree adapter.  This was secured in place with 2 interrupted sutures of 0 Prolene.  The connected catheter was then tunneled to a  counterincision in the upper quadrant and then out the lateral abdomen in a downward deflection.  Great care was taken to avoid twisting or kinking the catheter.  The abdomen was reinsufflated.  The peritoneum was inspected to ensure the catheter did not enter the cavity.  Satisfied we desufflated the abdomen.  The catheter was connected to cystoscopy tubing.  The catheter flushed and drained without any difficulty.  The trochars were removed.  All incisions were closed with interrupted 4-0 Monocryl sutures.  Dermabond was applied.  A sterile bandage was applied to the peritoneal dialysis catheter.   Upon completion of the case instrument and sharps counts were confirmed correct. The patient was transferred to the PACU in good condition. I was present for all portions of the procedure.  Yevonne Aline. Stanford Breed, MD [facility] Phone Number: [phone] 04/10/2022 1:55 PM

## 2022-04-10 NOTE — Anesthesia Procedure Notes (Signed)
Procedure Name: Intubation Date/Time: 04/10/2022 7:53 AM  Performed by: Erick Colace, CRNAPre-anesthesia Checklist: Patient identified, Emergency Drugs available, Suction available and Patient being monitored Patient Re-evaluated:Patient Re-evaluated prior to induction Oxygen Delivery Method: Circle system utilized Preoxygenation: Pre-oxygenation with 100% oxygen Induction Type: IV induction Ventilation: Mask ventilation without difficulty Laryngoscope Size: Mac and 4 Grade View: Grade II Tube type: Oral Tube size: 7.5 mm Number of attempts: 1 Airway Equipment and Method: Stylet and Oral airway Placement Confirmation: ETT inserted through vocal cords under direct vision, positive ETCO2 and breath sounds checked- equal and bilateral Secured at: 23 cm Tube secured with: Tape Dental Injury: Teeth and Oropharynx as per pre-operative assessment

## 2022-04-10 NOTE — Transfer of Care (Signed)
Immediate Anesthesia Transfer of Care Note  Patient: Phillip Lynch  Procedure(s) Performed: LAPAROSCOPIC INSERTION CONTINUOUS AMBULATORY PERITONEAL DIALYSIS  (CAPD) CATHETER WITH OMENTOPEXY  Patient Location: PACU  Anesthesia Type:General  Level of Consciousness: drowsy  Airway & Oxygen Therapy: Patient Spontanous Breathing  Post-op Assessment: Report given to RN and Post -op Vital signs reviewed and stable  Post vital signs: Reviewed and stable  Last Vitals:  Vitals Value Taken Time  BP 147/100 04/10/22 1400  Temp    Pulse 70 04/10/22 1402  Resp 10 04/10/22 1402  SpO2 100 % 04/10/22 1402  Vitals shown include unvalidated device data.  Last Pain:  Vitals:   04/10/22 1159  TempSrc:   PainSc: 0-No pain         Complications: No notable events documented.

## 2022-04-10 NOTE — Interval H&P Note (Signed)
History and Physical Interval Note:  04/10/2022 11:50 AM  Phillip Lynch  has presented today for surgery, with the diagnosis of End Stage Renal Disease.  The various methods of treatment have been discussed with the patient and family. After consideration of risks, benefits and other options for treatment, the patient has consented to  Procedure(s): [place]  (CAPD) CATHETER WITH POSSIBLE OMENTOPEXY (N/A) as a surgical intervention.  The patient's history has been reviewed, patient examined, no change in status, stable for surgery.  I have reviewed the patient's chart and labs.  Questions were answered to the patient's satisfaction.     Cherre Robins

## 2022-04-10 NOTE — Discharge Instructions (Signed)
Can begin flushing catheter as early as tomorrow 04/11/2022.  Contact/follow-up with your Nephrologist regarding catheter usage.  Any problems or concerns with incision healing or malfunctions of catheter, please call vascular surgery office.  You may follow-up as needed with your surgeon.

## 2022-04-11 ENCOUNTER — Encounter (HOSPITAL_COMMUNITY): Payer: Self-pay | Admitting: Vascular Surgery

## 2022-04-11 NOTE — Anesthesia Postprocedure Evaluation (Signed)
Anesthesia Post Note  Patient: Phillip Lynch  Procedure(s) Performed: LAPAROSCOPIC INSERTION CONTINUOUS AMBULATORY PERITONEAL DIALYSIS  (CAPD) CATHETER WITH OMENTOPEXY     Patient location during evaluation: PACU Anesthesia Type: General Level of consciousness: awake and alert Pain management: pain level controlled Vital Signs Assessment: post-procedure vital signs reviewed and stable Respiratory status: spontaneous breathing, nonlabored ventilation, respiratory function stable and patient connected to nasal cannula oxygen Cardiovascular status: blood pressure returned to baseline and stable Postop Assessment: no apparent nausea or vomiting Anesthetic complications: no   No notable events documented.  Last Vitals:  Vitals:   04/10/22 1430 04/10/22 1445  BP: (!) 123/92 132/86  Pulse: 62 62  Resp: 18 17  Temp:  36.7 C  SpO2: 97% 96%    Last Pain:  Vitals:   04/10/22 1445  TempSrc:   PainSc: [place]

## 2022-04-22 ENCOUNTER — Other Ambulatory Visit: Payer: Medicare Other

## 2022-05-05 ENCOUNTER — Other Ambulatory Visit: Payer: Medicare Other

## 2022-05-16 ENCOUNTER — Other Ambulatory Visit: Payer: Self-pay | Admitting: *Deleted

## 2022-05-16 ENCOUNTER — Ambulatory Visit: Payer: Medicare Other

## 2022-05-16 DIAGNOSIS — I5042 Chronic combined systolic (congestive) and diastolic (congestive) heart failure: Secondary | ICD-10-CM

## 2022-05-16 DIAGNOSIS — I428 Other cardiomyopathies: Secondary | ICD-10-CM

## 2022-05-16 DIAGNOSIS — N186 End stage renal disease: Secondary | ICD-10-CM

## 2022-05-18 ENCOUNTER — Inpatient Hospital Stay (HOSPITAL_COMMUNITY)
Admission: EM | Admit: 2022-05-18 | Discharge: 2022-05-22 | DRG: 981 | Disposition: A | Discharge status: Home / Self Care | Payer: Medicare Other | Attending: Internal Medicine | Admitting: Internal Medicine

## 2022-05-18 ENCOUNTER — Emergency Department (HOSPITAL_COMMUNITY): Payer: Medicare Other

## 2022-05-18 ENCOUNTER — Other Ambulatory Visit: Payer: Self-pay

## 2022-05-18 DIAGNOSIS — I428 Other cardiomyopathies: Secondary | ICD-10-CM | POA: Diagnosis present

## 2022-05-18 DIAGNOSIS — D539 Nutritional anemia, unspecified: Secondary | ICD-10-CM | POA: Diagnosis present

## 2022-05-18 DIAGNOSIS — N2581 Secondary hyperparathyroidism of renal origin: Secondary | ICD-10-CM | POA: Diagnosis present

## 2022-05-18 DIAGNOSIS — J9601 Acute respiratory failure with hypoxia: Secondary | ICD-10-CM | POA: Diagnosis present

## 2022-05-18 DIAGNOSIS — Z87891 Personal history of nicotine dependence: Secondary | ICD-10-CM | POA: Diagnosis not present

## 2022-05-18 DIAGNOSIS — I5020 Unspecified systolic (congestive) heart failure: Secondary | ICD-10-CM | POA: Diagnosis present

## 2022-05-18 DIAGNOSIS — D631 Anemia in chronic kidney disease: Secondary | ICD-10-CM | POA: Diagnosis present

## 2022-05-18 DIAGNOSIS — I132 Hypertensive heart and chronic kidney disease with heart failure and with stage 5 chronic kidney disease, or end stage renal disease: Secondary | ICD-10-CM | POA: Diagnosis not present

## 2022-05-18 DIAGNOSIS — D649 Anemia, unspecified: Secondary | ICD-10-CM | POA: Diagnosis not present

## 2022-05-18 DIAGNOSIS — I509 Heart failure, unspecified: Secondary | ICD-10-CM | POA: Diagnosis not present

## 2022-05-18 DIAGNOSIS — Z841 Family history of disorders of kidney and ureter: Secondary | ICD-10-CM | POA: Diagnosis not present

## 2022-05-18 DIAGNOSIS — D696 Thrombocytopenia, unspecified: Secondary | ICD-10-CM | POA: Diagnosis present

## 2022-05-18 DIAGNOSIS — Z992 Dependence on renal dialysis: Secondary | ICD-10-CM | POA: Diagnosis not present

## 2022-05-18 DIAGNOSIS — Z20822 Contact with and (suspected) exposure to covid-19: Secondary | ICD-10-CM | POA: Diagnosis present

## 2022-05-18 DIAGNOSIS — Z886 Allergy status to analgesic agent status: Secondary | ICD-10-CM | POA: Diagnosis not present

## 2022-05-18 DIAGNOSIS — Q600 Renal agenesis, unilateral: Secondary | ICD-10-CM

## 2022-05-18 DIAGNOSIS — Z79899 Other long term (current) drug therapy: Secondary | ICD-10-CM | POA: Diagnosis not present

## 2022-05-18 DIAGNOSIS — N186 End stage renal disease: Secondary | ICD-10-CM | POA: Diagnosis not present

## 2022-05-18 DIAGNOSIS — T82898A Other specified complication of vascular prosthetic devices, implants and grafts, initial encounter: Secondary | ICD-10-CM | POA: Diagnosis not present

## 2022-05-18 DIAGNOSIS — I5042 Chronic combined systolic (congestive) and diastolic (congestive) heart failure: Secondary | ICD-10-CM

## 2022-05-18 DIAGNOSIS — I5022 Chronic systolic (congestive) heart failure: Secondary | ICD-10-CM | POA: Diagnosis present

## 2022-05-18 DIAGNOSIS — R7989 Other specified abnormal findings of blood chemistry: Secondary | ICD-10-CM | POA: Insufficient documentation

## 2022-05-18 DIAGNOSIS — R195 Other fecal abnormalities: Secondary | ICD-10-CM | POA: Diagnosis present

## 2022-05-18 DIAGNOSIS — E875 Hyperkalemia: Principal | ICD-10-CM | POA: Diagnosis present

## 2022-05-18 HISTORY — DX: Acute respiratory failure with hypoxia: J96.01

## 2022-05-18 LAB — BASIC METABOLIC PANEL
Anion gap: 19 — ABNORMAL HIGH (ref 5–15)
Anion gap: 19 — ABNORMAL HIGH (ref 5–15)
BUN: 119 mg/dL — ABNORMAL HIGH (ref 6–20)
BUN: 64 mg/dL — ABNORMAL HIGH (ref 6–20)
CO2: 17 mmol/L — ABNORMAL LOW (ref 22–32)
CO2: 23 mmol/L (ref 22–32)
Calcium: 7.6 mg/dL — ABNORMAL LOW (ref 8.9–10.3)
Calcium: 8.1 mg/dL — ABNORMAL LOW (ref 8.9–10.3)
Chloride: 100 mmol/L (ref 98–111)
Chloride: 92 mmol/L — ABNORMAL LOW (ref 98–111)
Creatinine, Ser: 40.87 mg/dL — ABNORMAL HIGH (ref 0.61–1.24)
Creatinine, Ser: 24.42 mg/dL — ABNORMAL HIGH (ref 0.61–1.24)
GFR, Estimated: 1 mL/min — ABNORMAL LOW (ref >60)
GFR, Estimated: 2 mL/min — ABNORMAL LOW (ref >60)
Glucose, Bld: 82 mg/dL (ref 70–99)
Glucose, Bld: 187 mg/dL — ABNORMAL HIGH (ref 70–99)
Potassium: >7.5 mmol/L (ref 3.5–5.1)
Potassium: 4.1 mmol/L (ref 3.5–5.1)
Sodium: 136 mmol/L (ref 135–145)
Sodium: 134 mmol/L — ABNORMAL LOW (ref 135–145)

## 2022-05-18 LAB — DIFFERENTIAL
Basophils Absolute: 0 10*3/uL (ref 0.0–0.1)
Basophils Relative: 0 %
Eosinophils Absolute: 0 10*3/uL (ref 0.0–0.5)
Eosinophils Relative: 0 %
Lymphocytes Relative: 7 %
Lymphs Abs: 0.5 10*3/uL — ABNORMAL LOW (ref 0.7–4.0)
Monocytes Absolute: 0 10*3/uL — ABNORMAL LOW (ref 0.1–1.0)
Monocytes Relative: 0 %
Neutro Abs: 6.9 10*3/uL (ref 1.7–7.7)
Neutrophils Relative %: 93 %
nRBC: 0 /100 WBC

## 2022-05-18 LAB — CBC WITH DIFFERENTIAL/PLATELET
Abs Immature Granulocytes: 0.02 10*3/uL (ref 0.00–0.07)
Basophils Absolute: 0 10*3/uL (ref 0.0–0.1)
Basophils Relative: 0 %
Eosinophils Absolute: 0.1 10*3/uL (ref 0.0–0.5)
Eosinophils Relative: 1 %
HCT: 17.6 % — ABNORMAL LOW (ref 39.0–52.0)
Hemoglobin: 5.9 g/dL — CL (ref 13.0–17.0)
Immature Granulocytes: 0 %
Lymphocytes Relative: 10 %
Lymphs Abs: 0.7 10*3/uL (ref 0.7–4.0)
MCH: 34.3 pg — ABNORMAL HIGH (ref 26.0–34.0)
MCHC: 33.5 g/dL (ref 30.0–36.0)
MCV: 102.3 fL — ABNORMAL HIGH (ref 80.0–100.0)
Monocytes Absolute: 0.4 10*3/uL (ref 0.1–1.0)
Monocytes Relative: 6 %
Neutro Abs: 5.8 10*3/uL (ref 1.7–7.7)
Neutrophils Relative %: 83 %
Platelets: 99 10*3/uL — ABNORMAL LOW (ref 150–400)
RBC: 1.72 MIL/uL — ABNORMAL LOW (ref 4.22–5.81)
RDW: 14.9 % (ref 11.5–15.5)
WBC: 7 10*3/uL (ref 4.0–10.5)
nRBC: 0 % (ref 0.0–0.2)

## 2022-05-18 LAB — CBC
HCT: 18.8 % — ABNORMAL LOW (ref 39.0–52.0)
HCT: 23.5 % — ABNORMAL LOW (ref 39.0–52.0)
Hemoglobin: 6.2 g/dL — CL (ref 13.0–17.0)
Hemoglobin: 7.9 g/dL — ABNORMAL LOW (ref 13.0–17.0)
MCH: 33.9 pg (ref 26.0–34.0)
MCH: 32.8 pg (ref 26.0–34.0)
MCHC: 33 g/dL (ref 30.0–36.0)
MCHC: 33.6 g/dL (ref 30.0–36.0)
MCV: 102.7 fL — ABNORMAL HIGH (ref 80.0–100.0)
MCV: 97.5 fL (ref 80.0–100.0)
Platelets: 96 10*3/uL — ABNORMAL LOW (ref 150–400)
Platelets: 115 10*3/uL — ABNORMAL LOW (ref 150–400)
RBC: 1.83 MIL/uL — ABNORMAL LOW (ref 4.22–5.81)
RBC: 2.41 MIL/uL — ABNORMAL LOW (ref 4.22–5.81)
RDW: 15 % (ref 11.5–15.5)
RDW: 15.4 % (ref 11.5–15.5)
WBC: 7.4 10*3/uL (ref 4.0–10.5)
WBC: 5 10*3/uL (ref 4.0–10.5)
nRBC: 0 % (ref 0.0–0.2)
nRBC: 0 % (ref 0.0–0.2)

## 2022-05-18 LAB — RETICULOCYTES
Immature Retic Fract: 9.6 % (ref 2.3–15.9)
RBC.: 1.86 MIL/uL — ABNORMAL LOW (ref 4.22–5.81)
Retic Count, Absolute: 19.5 10*3/uL (ref 19.0–186.0)
Retic Ct Pct: 1.1 % (ref 0.4–3.1)

## 2022-05-18 LAB — PROTIME-INR
INR: 1.2 (ref 0.8–1.2)
Prothrombin Time: 15 seconds (ref 11.4–15.2)

## 2022-05-18 LAB — IRON AND TIBC
Iron: 200 ug/dL — ABNORMAL HIGH (ref 45–182)
Saturation Ratios: 88 % — ABNORMAL HIGH (ref 17.9–39.5)
TIBC: 228 ug/dL — ABNORMAL LOW (ref 250–450)
UIBC: 28 ug/dL

## 2022-05-18 LAB — HIV ANTIBODY (ROUTINE TESTING W REFLEX): HIV Screen 4th Generation wRfx: NONREACTIVE

## 2022-05-18 LAB — POC OCCULT BLOOD, ED: Fecal Occult Bld: NEGATIVE

## 2022-05-18 LAB — RESP PANEL BY RT-PCR (FLU A&B, COVID) ARPGX2
Influenza A by PCR: NEGATIVE
Influenza B by PCR: NEGATIVE
SARS Coronavirus 2 by RT PCR: NEGATIVE

## 2022-05-18 LAB — FOLATE: Folate: 28.5 ng/mL (ref >5.9)

## 2022-05-18 LAB — TROPONIN I (HIGH SENSITIVITY)
Troponin I (High Sensitivity): 143 ng/L (ref <18)
Troponin I (High Sensitivity): 121 ng/L (ref <18)

## 2022-05-18 LAB — TECHNOLOGIST SMEAR REVIEW

## 2022-05-18 LAB — ALBUMIN: Albumin: 3.4 g/dL — ABNORMAL LOW (ref 3.5–5.0)

## 2022-05-18 LAB — VITAMIN B12: Vitamin B-12: 590 pg/mL (ref 180–914)

## 2022-05-18 LAB — FERRITIN: Ferritin: 1289 ng/mL — ABNORMAL HIGH (ref 24–336)

## 2022-05-18 LAB — HEPATITIS B SURFACE ANTIGEN: Hepatitis B Surface Ag: NONREACTIVE

## 2022-05-18 LAB — PROCALCITONIN: Procalcitonin: 0.53 ng/mL

## 2022-05-18 LAB — LACTATE DEHYDROGENASE: LDH: 193 U/L — ABNORMAL HIGH (ref 98–192)

## 2022-05-18 LAB — CBG MONITORING, ED: Glucose-Capillary: 145 mg/dL — ABNORMAL HIGH (ref 70–99)

## 2022-05-18 LAB — PHOSPHORUS: Phosphorus: 6.2 mg/dL — ABNORMAL HIGH (ref 2.5–4.6)

## 2022-05-18 LAB — APTT: aPTT: 27 seconds (ref 24–36)

## 2022-05-18 LAB — PREPARE RBC (CROSSMATCH)

## 2022-05-18 LAB — ABO/RH: ABO/RH(D): A POS

## 2022-05-18 MED ORDER — CHLORHEXIDINE GLUCONATE CLOTH 2 % EX PADS: 6.0000 | MEDICATED_PAD | Freq: Every day | CUTANEOUS | Status: DC

## 2022-05-18 MED ORDER — CALCIUM GLUCONATE 10 % IV SOLN
1.0000 g | Freq: Once | INTRAVENOUS | Status: AC
  Administered 2022-05-18: 1 g via INTRAVENOUS
  Filled 2022-05-18: qty 10

## 2022-05-18 MED ORDER — ACETAMINOPHEN 650 MG RE SUPP: 650.0000 mg | Freq: Four times a day (QID) | RECTAL | Status: DC | PRN

## 2022-05-18 MED ORDER — ACETAMINOPHEN 325 MG PO TABS: 650.0000 mg | ORAL_TABLET | Freq: Four times a day (QID) | ORAL | Status: DC | PRN

## 2022-05-18 MED ORDER — LANTHANUM CARBONATE 500 MG PO CHEW
500.0000 mg | CHEWABLE_TABLET | Freq: Three times a day (TID) | ORAL | Status: DC
  Administered 2022-05-18 – 2022-05-22 (×8): 500 mg via ORAL
  Filled 2022-05-18 (×14): qty 1

## 2022-05-18 MED ORDER — INSULIN ASPART 100 UNIT/ML IV SOLN
5.0000 [IU] | Freq: Once | INTRAVENOUS | Status: AC
  Administered 2022-05-18: 5 [IU] via INTRAVENOUS

## 2022-05-18 MED ORDER — HEPARIN SODIUM (PORCINE) 1000 UNIT/ML IJ SOLN
INTRAMUSCULAR | Status: AC
  Administered 2022-05-18: 1000 [IU]
  Filled 2022-05-18: qty 8

## 2022-05-18 MED ORDER — CHLORHEXIDINE GLUCONATE CLOTH 2 % EX PADS
6.0000 | MEDICATED_PAD | Freq: Every day | CUTANEOUS | Status: DC
  Administered 2022-05-21 – 2022-05-22 (×2): 6 via TOPICAL

## 2022-05-18 MED ORDER — CARVEDILOL 6.25 MG PO TABS
6.2500 mg | ORAL_TABLET | Freq: Two times a day (BID) | ORAL | Status: DC
  Administered 2022-05-18 – 2022-05-22 (×8): 6.25 mg via ORAL
  Filled 2022-05-18: qty 2
  Filled 2022-05-18: qty 1
  Filled 2022-05-18 (×2): qty 2
  Filled 2022-05-18: qty 1
  Filled 2022-05-18 (×2): qty 2
  Filled 2022-05-18 (×2): qty 1

## 2022-05-18 MED ORDER — DEXTROSE 50 % IV SOLN
1.0000 | Freq: Once | INTRAVENOUS | Status: AC
  Administered 2022-05-18: 50 mL via INTRAVENOUS
  Filled 2022-05-18: qty 50

## 2022-05-18 MED ORDER — ALBUTEROL SULFATE (2.5 MG/3ML) 0.083% IN NEBU
10.0000 mg | INHALATION_SOLUTION | Freq: Once | RESPIRATORY_TRACT | Status: AC
  Administered 2022-05-18: 10 mg via RESPIRATORY_TRACT
  Filled 2022-05-18: qty 12

## 2022-05-18 MED ORDER — SODIUM CHLORIDE 0.9% IV SOLUTION: Freq: Once | INTRAVENOUS | Status: DC

## 2022-05-18 MED ORDER — CINACALCET HCL 30 MG PO TABS
30.0000 mg | ORAL_TABLET | Freq: Every day | ORAL | Status: DC
  Administered 2022-05-19 – 2022-05-22 (×3): 30 mg via ORAL
  Filled 2022-05-18 (×4): qty 1

## 2022-05-18 MED ORDER — HEPARIN BOLUS VIA INFUSION
4000.0000 [IU] | Freq: Once | INTRAVENOUS | Status: AC
  Administered 2022-05-18: 4000 [IU] via INTRAVENOUS

## 2022-05-18 MED ORDER — SODIUM ZIRCONIUM CYCLOSILICATE 10 G PO PACK
10.0000 g | PACK | Freq: Once | ORAL | Status: AC
  Administered 2022-05-18: 10 g via ORAL
  Filled 2022-05-18: qty 1

## 2022-05-18 NOTE — Consult Note (Signed)
Renal Service Consult Note [facility]  Phillip Lynch 05/18/2022 Sol Blazing, MD Requesting Physician: Dr. Johnnye Sima  Reason for Consult: ESRD pt w/ uremia/ [name] after trying PD for 2-3 wks HPI: The patient is a 56 y.o. year-old w/ PMH as below who presents to ED this am w/ c/o SOB and chest pain. Pt had been on HD since Jan 2022, then wanted to try PD and started PD 2-3 wks ago. But doesn't like PD and likely has not been doing it much.  In ED BP's wnl, CXR shows pulm edema R> L , K+ > 7.5 and creat 40. We are asked to see for dialysis.   Pt seen in room. Main c/o's are SOB, loss of energy. Was trying PD but doesn't like it so was supposed to start back on iHD tomorrow 12/4 at [place]. His renal MD is Dr Royce Macadamia. He liked HD and thinks PD is just too much work for him.    ROS - denies CP, no joint pain, no HA, no blurry vision, no rash, no diarrhea, no nausea/ vomiting, no dysuria, no difficulty voiding   Past Medical History  Past Medical History:  Diagnosis Date   CHF (congestive heart failure) (HCC)    Congenital renal agenesis, unilateral    Hypertension    Unintentional weight loss    Past Surgical History  Past Surgical History:  Procedure Laterality Date   AV FISTULA PLACEMENT Left 05/13/2021   Procedure: ARTERIOVENOUS (AV) FISTULA CREATION;  Surgeon: Broadus John, MD;  Location: [place];  Service: Vascular;  Laterality: Left;   CAPD INSERTION N/A 04/10/2022   Procedure: LAPAROSCOPIC INSERTION CONTINUOUS AMBULATORY PERITONEAL DIALYSIS  (CAPD) CATHETER WITH OMENTOPEXY;  Surgeon: Cherre Robins, MD;  Location: [place];  Service: Vascular;  Laterality: N/A;   IR FLUORO GUIDE CV LINE RIGHT  07/15/2021   IR US GUIDE VASC ACCESS RIGHT  07/16/2021   RIGHT/LEFT HEART CATH AND CORONARY ANGIOGRAPHY N/A 02/18/2022   Procedure: RIGHT/LEFT HEART CATH AND CORONARY ANGIOGRAPHY;  Surgeon: Adrian Prows, MD;  Location: [place] [facility];  Service: Cardiovascular;   Laterality: N/A;   Family History  Family History  Problem Relation Age of Onset   Stroke Mother    Dementia Mother    Kidney disease Mother    Social History  reports that he quit smoking about 13 months ago. His smoking use included cigars. He has never used smokeless tobacco. He reports current alcohol use. He reports that he does not currently use drugs after having used the following drugs: Marijuana. Allergies  Allergies  Allergen Reactions   Nsaids Other (See Comments)    Patient was born with only 1 kidney and was told to not take NSAID(s)   Home medications Prior to Admission medications   Medication Sig Start Date End Date Taking? Authorizing Provider  B Complex-C-Folic Acid (DIALYVITE 782) 0.8 MG TABS Take 1 tablet by mouth daily.    [provider]  carvedilol (COREG) 6.25 MG tablet Take 1 tablet (6.25 mg total) by mouth 2 (two) times daily. 04/03/22   Tolia, Sunit, DO  HYDROcodone-acetaminophen (NORCO) 5-325 MG tablet Take 1 tablet by mouth every 6 (six) hours as needed for moderate pain. 04/10/22   Dagoberto Ligas, PA-C  SENSIPAR 30 MG tablet Take 30 mg by mouth daily with breakfast. 10/07/21   [provider]  sucroferric oxyhydroxide (VELPHORO) 500 MG chewable tablet Chew 500 mg by mouth 3 (three) times daily with meals.  [provider]     Vitals:   05/18/22 6767 05/18/22 0718 05/18/22 0730 05/18/22 0918  BP: (!) 152/95   131/89  Pulse: 81   78  Resp: 18   14  Temp: 98 F (36.7 C)     TempSrc: Temporal     SpO2: 98%  94% 98%  Weight:  68 kg    Height:  5\' 8"  (1.727 m)     Exam Gen alert, no distress, nasal O2 No rash, cyanosis or gangrene Sclera anicteric, throat clear  No jvd or bruits Chest slightly rales at bases RRR no MRG Abd soft ntnd no mass or ascites +bs MS no joint effusions or deformity Ext trace LE edema, no wounds or ulcers Neuro is alert, Ox 3 , nf    PD cath intact + RIJ TDC in place     Home meds  include - coreg 6.25 bid, sensipar 30 qam, norco prn, velphoro 500 ac tid, prns     OP PD: CCPD 7 days per week    Assessment/ Plan: Hyperkalemia - no severe EKG changes. Getting temporizing measures and will plan emergent HD today upstairs. Renal diet.  Uremia - creat 40, will not dialyze too aggressively to avoid dysequilibrium. Approx 3h today and 3h tomorrow, BFR 250-300.  AHRF - SOB w/ sig pulm edema on CXR. UF max w/ HD today/ tomorrow.  ESRD - HD pt who was trying out PD x last 2-3 wks; doesn't like PD and was supposed to start back HD at [facility] tomorrow 12/4. HD here as above.  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 Have d/w pmd, will give 1u prbc w/ HD today and a 2nd unit w/ HD tomorrow.  MBD ckd - Ca in range, add on alb/ phos. Cont sensipar and fosrenol as binder.       Phillip Splinter  MD 05/18/2022, 10:38 AM Recent Labs  Lab 05/18/22 0744  HGB 5.9*  CALCIUM 7.6*  CREATININE 40.87*  K >7.5*   Inpatient medications:  sodium chloride   Intravenous Once   albuterol  10 mg Nebulization Once   Chlorhexidine Gluconate Cloth  6 each Topical Q0600   Chlorhexidine Gluconate Cloth  6 each Topical Q0600    acetaminophen **OR** acetaminophen

## 2022-05-18 NOTE — ED Notes (Signed)
Pt's O2 sat dropped to 88% at rest.  Pt placed on [place] at 3L  Sats improved to 96%

## 2022-05-18 NOTE — ED Triage Notes (Signed)
Pt bib [facility] from home c/o SOB + CP that started this morning. SPO2 79% RA, given albuterol, atrovent, and solumedrol, SPO2 100% with neb. Also given 1 nitro + 324 ASA with slight improvement. Dialysis pt, MWF, last session Friday  BP 178/110 HR 100 RR 23 T 98.3

## 2022-05-18 NOTE — ED Notes (Signed)
Patient transported to X-ray 

## 2022-05-18 NOTE — ED Provider Notes (Signed)
[facility] EMERGENCY DEPARTMENT Provider Note   CSN: [id number] Arrival date & time: 05/18/22  3428     History  Chief Complaint  Patient presents with   Shortness of Breath    Phillip Lynch is a 56 y.o. male.  HPI Patient resents with shortness of breath.  Also chest pain and tightness.  Began yesterday.  Feeling worse today.  Upon EMS arrival found to have a pulse ox of 97%.  Given breathing treatment.  Also given nitroglycerin.  Monday Wednesday Friday dialysis patient was last dialyzed on Friday.  States he was feeling good on Friday.  Has had a cough with no sputum production.  Chest feels tight.   Past Medical History:  Diagnosis Date   CHF (congestive heart failure) (HCC)    Congenital renal agenesis, unilateral    Hypertension    Unintentional weight loss     Home Medications Prior to Admission medications   Medication Sig Start Date End Date Taking? Authorizing Provider  B Complex-C-Folic Acid (DIALYVITE 768) 0.8 MG TABS Take 1 tablet by mouth daily.    [provider]  carvedilol (COREG) 6.25 MG tablet Take 1 tablet (6.25 mg total) by mouth 2 (two) times daily. 04/03/22   Tolia, Sunit, DO  HYDROcodone-acetaminophen (NORCO) 5-325 MG tablet Take 1 tablet by mouth every 6 (six) hours as needed for moderate pain. 04/10/22   Dagoberto Ligas, PA-C  SENSIPAR 30 MG tablet Take 30 mg by mouth daily with breakfast. 10/07/21   [provider]  sucroferric oxyhydroxide (VELPHORO) 500 MG chewable tablet Chew 500 mg by mouth 3 (three) times daily with meals.    [provider]      Allergies    Nsaids    Review of Systems   Review of Systems  Physical Exam Updated Vital Signs BP 131/89   Pulse 78   Temp 98 F (36.7 C) (Temporal)   Resp 14   Ht 5\' 8"  (1.727 m)   Wt 68 kg   SpO2 98%   BMI 22.81 kg/m  Physical Exam Vitals and nursing note reviewed.  Eyes:     Pupils: Pupils are equal, round, and reactive to light.   Cardiovascular:     Rate and Rhythm: Regular rhythm.  Pulmonary:     Breath sounds: No wheezing.     Comments: Right chest wall dialysis catheter. Abdominal:     Tenderness: There is no abdominal tenderness.  Musculoskeletal:     Cervical back: Neck supple.     Right lower leg: No edema.     Left lower leg: No edema.  Skin:    General: Skin is warm.  Neurological:     Mental Status: He is alert and oriented to person, place, and time.   Dressed peritoneal dialysis catheter on abdomen.  ED Results / Procedures / Treatments   Labs (all labs ordered are listed, but only abnormal results are displayed) Labs Reviewed  BASIC METABOLIC PANEL - Abnormal; Notable for the following components:      Result Value   Potassium >7.5 (*)    CO2 17 (*)    BUN 119 (*)    Creatinine, Ser 40.87 (*)    Calcium 7.6 (*)    GFR, Estimated 1 (*)    Anion gap 19 (*)    All other components within normal limits  CBC WITH DIFFERENTIAL/PLATELET - Abnormal; Notable for the following components:   RBC 1.72 (*)    Hemoglobin 5.9 (*)  HCT 17.6 (*)    MCV 102.3 (*)    MCH 34.3 (*)    Platelets 99 (*)    All other components within normal limits  TROPONIN I (HIGH SENSITIVITY) - Abnormal; Notable for the following components:   Troponin I (High Sensitivity) 143 (*)    All other components within normal limits  RESP PANEL BY RT-PCR (FLU A&B, COVID) ARPGX2  POC OCCULT BLOOD, ED  TYPE AND SCREEN  ABO/RH  TROPONIN I (HIGH SENSITIVITY)    EKG EKG Interpretation  Date/Time:  Sunday May 18 2022 07:53:35 EST Ventricular Rate:  77 PR Interval:  184 QRS Duration: 96 QT Interval:  476 QTC Calculation: 538 R Axis:   32 Text Interpretation: Normal sinus rhythm Possible Left atrial enlargement Minimal voltage criteria for LVH, may be normal variant ( Cornell product ) Nonspecific ST and T wave abnormality Prolonged QT Abnormal ECG When compared with ECG of 07-Dec-2021 12:31, No significant change  since last tracing Confirmed by Davonna Belling [phone]) on 05/18/2022 9:12:05 AM  Radiology DG Chest 2 View  Result Date: 05/18/2022 CLINICAL DATA:  Chest pain and shortness of breath EXAM: CHEST - 2 VIEW COMPARISON:  01/06/2022 FINDINGS: Interstitial and airspace opacity with small right pleural effusion. Denser opacity at the right base but a bilateral process. Cardiomegaly. Dialysis catheter with tip at the upper cavoatrial junction. No pneumothorax. IMPRESSION: Asymmetric edema versus right base pneumonia. Electronically Signed   By: Jorje Guild M.D.   On: 05/18/2022 07:41    Procedures Procedures    Medications Ordered in ED Medications  sodium zirconium cyclosilicate (LOKELMA) packet 10 g (has no administration in time range)  calcium gluconate inj 10% (1 g) URGENT USE ONLY! (has no administration in time range)  albuterol (PROVENTIL) (2.5 MG/3ML) 0.083% nebulizer solution 10 mg (has no administration in time range)  insulin aspart (novoLOG) injection 5 Units (has no administration in time range)    And  dextrose 50 % solution 50 mL (has no administration in time range)    ED Course/ Medical Decision Making/ A&P                           Medical Decision Making Amount and/or Complexity of Data Reviewed Labs: ordered. Radiology: ordered.  Risk OTC drugs. Prescription drug management. Decision regarding hospitalization.   Patient with shortness of breath and cough.  Anterior chest pain.  Dialysis patient.  Differential diagnosis includes pneumonia, viral infection, CHF, ACS.  Will get x-ray and blood work.  Reviewed heart cath from 3 months ago that showed nonischemic cardiomyopathy.  X-ray shows symmetric edema versus pneumonia.  Independently interpreted by me.  Also found to have anemia with hemoglobin of 5.9.  Also platelets of 99.  Hemoccult done and did show black stool potentially from medications but was guaiac negative.  Patient was dyspneic just turning over to  be able to do the rectal exam.  However also shows a severe hyperkalemia on the blood work.  Also BUN and creatinine elevated.  Today is Sunday and was last dialyzed on Friday.  EKG is reassuring considering blood work showing such an abnormality.  Will treat somewhat aggressively at this point.  We will also discuss with nephrology for more urgent intervention.  At this point likely needs some blood but would not necessarily transfuse yet to volume status is a little more under control.  CRITICAL CARE Performed by: Davonna Belling Total critical care time: 30 minutes Critical  care time was exclusive of separately billable procedures and treating other patients. Critical care was necessary to treat or prevent imminent or life-threatening deterioration. Critical care was time spent personally by me on the following activities: development of treatment plan with patient and/or surrogate as well as nursing, discussions with consultants, evaluation of patient's response to treatment, examination of patient, obtaining history from patient or surrogate, ordering and performing treatments and interventions, ordering and review of laboratory studies, ordering and review of radiographic studies, pulse oximetry and re-evaluation of patient's condition.         Final Clinical Impression(s) / ED Diagnoses Final diagnoses:  End stage renal disease on dialysis ([place])  Hyperkalemia  Anemia, unspecified type    Rx / DC Orders ED Discharge Orders     None         Davonna Belling, MD 05/18/22 1515

## 2022-05-18 NOTE — ED Notes (Signed)
Critical hemoglobin 5.9.  MD Pickering notified and aware.  No new orders at this time.

## 2022-05-18 NOTE — ED Notes (Signed)
Critical potassium level greater than 7.5, MD notified

## 2022-05-18 NOTE — Progress Notes (Signed)
Received patient in bed to unit.  Alert and oriented.  Informed consent signed and in chart.   Treatment initiated: 1230 Treatment completed: 1545  Patient tolerated well.  Transported back to the room  Alert, without acute distress.  Hand-off given to patient's nurse.   Access used: catheter Access issues: none  Total UF removed: 3 L  Medication(s) given: Transfused ! Unit PRBC Post HD VS: 144/99 P 84 R 18  Post HD weight: 65.2 KG   [facility]

## 2022-05-18 NOTE — H&P (Signed)
Date: 05/18/2022               Patient Name:  Phillip Lynch MRN: [id number]  DOB: 1965/06/30 Age / Sex: 57 y.o., male   PCP: Patient, [name]         Medical Service: Internal Medicine Teaching Service         Attending Physician: Dr. Johnnye Sima, Doroteo Bradford, MD    First Contact: Dr. Humphrey Rolls Pager: [phone]  Second Contact: Dr. Humphrey Rolls Pager: [phone]       After Hours (After 5p/  First Contact Pager: [phone]  weekends / holidays): Second Contact Pager: [phone]   Chief Concern: Chest pain, shortness of breath  History of Present Illness:   Phillip Lynch is a 56 year old male well-known living with ESRD currently on PD, congenital renal agenesis, HFrEF, NICM, hypertension, who presented to the emergency room for 1 day of not feeling well.  Symptoms started Friday night, Saturday morning.  Symptoms include cold, chills, fatigue, runny stool, productive cough, chest tightness, shortness of breath.  Symptoms worse with movement.  Unsure of the triggers.  He reports poor p.o. intake as well.  He denies any recent medication change.  Denies sick contact.  Said that he currently does not have chest pain.  Patient denies any obvious bleeding.  Patient report dark stool but this has been going on since he was started on iron supplement.  He denies hematochezia, GERD, epigastric pain.  No past colonoscopy or EGD.  Patient was started on hemodialysis in January 2022.  He was switched to peritoneal dialysis 2 weeks ago but did not tolerate it due to the timing and the dextrose solution.  Patient was supposed to go back to hemodialysis tomorrow.  He has a peritoneal catheter and the right tunneled catheter.  In the ED, patient was mildly hypertensive, satting 94% on 3 L.  CBC notable for hemoglobin of 5.9, MCV 102.3 and platelets 99.  BMP showed potassium > 7.5, CO2 of 17, creatinine 40, BUN 119.  No EKG changes.  Patient was given temporizing measure with calcium gluconate, insulin, albuterol and  Lokelma.  Nephrology was consulted for urgent dialysis.  Meds:  -Carvedilol -Norco -Vascepa -Sucroferric Oxyhydroxide  Allergies: Allergies as of 05/18/2022 - Review Complete 04/10/2022  Allergen Reaction Noted   Nsaids Other (See Comments) 07/20/2019   Past Medical History:  Diagnosis Date   CHF (congestive heart failure) (HCC)    Congenital renal agenesis, unilateral    Hypertension    Unintentional weight loss     Family History:  Family History  Problem Relation Age of Onset   Stroke Mother    Dementia Mother    Kidney disease Mother      Social History:  -Lives by himself.  Independent with ADLs.  Active person.  Sister is the POA. -Denies smoking, alcohol or drug use -PCP: Recently changed due to insurance.  Review of Systems: A complete ROS was negative except as per HPI.   Physical Exam: Blood pressure 131/89, pulse 78, temperature 98 F (36.7 C), temperature source Temporal, resp. rate 14, height 5\' 8"  (1.727 m), weight 68 kg, SpO2 98 %. Physical Exam Constitutional:      General: He is in acute distress.  HENT:     Head: Normocephalic.  Eyes:     General:        Right eye: No discharge.        Left eye: No discharge.     Conjunctiva/sclera: Conjunctivae normal.  Cardiovascular:     Rate and Rhythm: Normal rate and regular rhythm.     Comments: No JVD, no LE edema Pulmonary:     Effort: Pulmonary effort is normal.     Breath sounds: No wheezing.     Comments: Diminished breath sounds on the right side Abdominal:     General: There is no distension.     Palpations: Abdomen is soft.     Tenderness: There is no abdominal tenderness.     Comments: PD site appears clean and noninfected  Musculoskeletal:        General: Normal range of motion.     Cervical back: Normal range of motion and neck supple. No rigidity.     Comments: Right-sided tunneled cath in place  Skin:    General: Skin is warm.  Neurological:     Mental Status: He is alert.   Psychiatric:        Mood and Affect: Mood normal.        Behavior: Behavior normal.      EKG: personally reviewed my interpretation is NSR, prolonged QTc  CXR: personally reviewed my interpretation is pulmonary vascular congestion, asymmetric edema  Assessment & Plan by Problem: Principal Problem:   Hyperkalemia Active Problems:   HFrEF (heart failure with reduced ejection fraction) (HCC)   Symptomatic anemia   Thrombocytopenia (HCC)   Acute hypoxic respiratory failure (HCC)  Phillip Lynch is a 56 year old male well-known living with ESRD currently on PD, congenital renal agenesis, HFrEF, NICM, hypertension, who presented to the emergency room for 1 day of not feeling well, needed for hyperkalemic emergency and symptomatic anemia  Acute on chronic symptomatic anemia Thrombocytopenia Patient had a significant change in his hemoglobin, 13.6 in October and 5.9 today.  No obvious source of bleeding.  Differential include anemia of CKD vs hemolytic anemia vs GI bleed.  His elevated potassium and BUN are concerning for hemolysis process (HUS, TTP, DIC) -Transfuse 1 unit today with HD per nephrology to avoid volume overload -Check LDH, haptoglobin, smear, PT/INR. PTT, reticulocyte, ferritin, iron/TIBC, B12 and folate -Will need to call GI if hemolysis is ruled out  ESRD on PD Hyperkalemia emergency K > 7.5, could be secondary to failure of PD vs hemolysis.  Patient received temporizing measure and will be emergently dialyzed today.  He will need slow and serial HD sessions to avoid disequilibrium.  Patient does not appear grossly volume overloaded exam except for pulmonary edema. -Appreciate nephrology recommendation -Will need to remove PD catheter per nephro -His chest x-ray is more consistent with pulmonary edema.  Checking Pro-Cal and viral panel to rule out infectious process  Chest pain Elevated troponin Low suspicion for ACS.  His chest pain could be from symptomatic anemia and  volume overload.  Suspect type II NSTEMI.  -Continue trending troponin  Chronic HFrEF Nonischemic cardiomyopathy Hypertension EF 30-35% on most recent echo.  Left and right heart cath confirmed nonischemic cardiomyopathy. -Resume carvedilol 6.25 mg twice daily -HD for volume removal  Renal diet Full code IVF: N/A DVT: SCDs  Dispo: Admit patient to Inpatient with expected length of stay greater than 2 midnights.  SignedGaylan Gerold, DO 05/18/2022, 11:15 AM  Pager: [phone] After 5pm on weekdays and 1pm on weekends: On Call pager: [phone]

## 2022-05-18 NOTE — Procedures (Signed)
   I was present at this dialysis session, have reviewed the session itself and made  appropriate changes [name] MD [place] pager [phone]   05/18/2022, 4:03 PM

## 2022-05-19 DIAGNOSIS — I509 Heart failure, unspecified: Secondary | ICD-10-CM

## 2022-05-19 DIAGNOSIS — I5020 Unspecified systolic (congestive) heart failure: Secondary | ICD-10-CM

## 2022-05-19 DIAGNOSIS — D696 Thrombocytopenia, unspecified: Secondary | ICD-10-CM

## 2022-05-19 DIAGNOSIS — N186 End stage renal disease: Secondary | ICD-10-CM

## 2022-05-19 DIAGNOSIS — N19 Unspecified kidney failure: Secondary | ICD-10-CM

## 2022-05-19 DIAGNOSIS — E875 Hyperkalemia: Secondary | ICD-10-CM

## 2022-05-19 DIAGNOSIS — I132 Hypertensive heart and chronic kidney disease with heart failure and with stage 5 chronic kidney disease, or end stage renal disease: Secondary | ICD-10-CM | POA: Diagnosis not present

## 2022-05-19 DIAGNOSIS — Z87891 Personal history of nicotine dependence: Secondary | ICD-10-CM

## 2022-05-19 DIAGNOSIS — D539 Nutritional anemia, unspecified: Secondary | ICD-10-CM

## 2022-05-19 DIAGNOSIS — T82898A Other specified complication of vascular prosthetic devices, implants and grafts, initial encounter: Secondary | ICD-10-CM

## 2022-05-19 LAB — COMPREHENSIVE METABOLIC PANEL
ALT: 21 U/L (ref 0–44)
AST: 26 U/L (ref 15–41)
Albumin: 3.1 g/dL — ABNORMAL LOW (ref 3.5–5.0)
Alkaline Phosphatase: 43 U/L (ref 38–126)
Anion gap: 17 — ABNORMAL HIGH (ref 5–15)
BUN: 80 mg/dL — ABNORMAL HIGH (ref 6–20)
CO2: 24 mmol/L (ref 22–32)
Calcium: 7.8 mg/dL — ABNORMAL LOW (ref 8.9–10.3)
Chloride: 94 mmol/L — ABNORMAL LOW (ref 98–111)
Creatinine, Ser: 26.77 mg/dL — ABNORMAL HIGH (ref 0.61–1.24)
GFR, Estimated: 2 mL/min — ABNORMAL LOW (ref >60)
Glucose, Bld: 109 mg/dL — ABNORMAL HIGH (ref 70–99)
Potassium: 5.1 mmol/L (ref 3.5–5.1)
Sodium: 135 mmol/L (ref 135–145)
Total Bilirubin: 0.9 mg/dL (ref 0.3–1.2)
Total Protein: 6.2 g/dL — ABNORMAL LOW (ref 6.5–8.1)

## 2022-05-19 LAB — RESPIRATORY PANEL BY PCR

## 2022-05-19 LAB — BPAM RBC
Blood Product Expiration Date: 202312122359
ISSUE DATE / TIME: 202312031310
Unit Type and Rh: 6200

## 2022-05-19 LAB — CBC
HCT: 20.1 % — ABNORMAL LOW (ref 39.0–52.0)
Hemoglobin: 7.2 g/dL — ABNORMAL LOW (ref 13.0–17.0)
MCH: 34 pg (ref 26.0–34.0)
MCHC: 35.8 g/dL (ref 30.0–36.0)
MCV: 94.8 fL (ref 80.0–100.0)
Platelets: 102 10*3/uL — ABNORMAL LOW (ref 150–400)
RBC: 2.12 MIL/uL — ABNORMAL LOW (ref 4.22–5.81)
RDW: 16 % — ABNORMAL HIGH (ref 11.5–15.5)
WBC: 5.4 10*3/uL (ref 4.0–10.5)
nRBC: 0 % (ref 0.0–0.2)

## 2022-05-19 LAB — TYPE AND SCREEN
ABO/RH(D): A POS
Antibody Screen: NEGATIVE
Unit division: 0

## 2022-05-19 LAB — CBG MONITORING, ED: Glucose-Capillary: 129 mg/dL — ABNORMAL HIGH (ref 70–99)

## 2022-05-19 MED ORDER — HEPARIN SODIUM (PORCINE) 1000 UNIT/ML DIALYSIS
2000.0000 [IU] | INTRAMUSCULAR | Status: DC | PRN
  Filled 2022-05-19: qty 2

## 2022-05-19 MED ORDER — HEPARIN SODIUM (PORCINE) 1000 UNIT/ML DIALYSIS: 2000.0000 [IU] | INTRAMUSCULAR | Status: DC | PRN

## 2022-05-19 MED ORDER — HEPARIN SODIUM (PORCINE) 1000 UNIT/ML IJ SOLN
INTRAMUSCULAR | Status: AC
  Administered 2022-05-19: 4000 [IU]
  Filled 2022-05-19: qty 4

## 2022-05-19 MED ORDER — DARBEPOETIN ALFA 60 MCG/0.3ML IJ SOSY
60.0000 ug | PREFILLED_SYRINGE | INTRAMUSCULAR | Status: DC
  Administered 2022-05-19: 60 ug via SUBCUTANEOUS
  Filled 2022-05-19: qty 0.3

## 2022-05-19 NOTE — Progress Notes (Signed)
Advised by renal PA that pt has a chair at [facility] MWF at d/c. Contacted Emilie Rutter to see if pt needs to arrive early for first treatment to do paperwork prior to treatment. Pt can start on Wednesday and will need to arrive at 10:45 to complete paperwork prior to treatment at 11:35 am chair time (MWF). Attempted to reach pt via phone to remind him of this info but was unable to reach pt and unable to leave a message. Information added to pt's AVS.   Melven Sartorius Renal Navigator [phone]

## 2022-05-19 NOTE — Progress Notes (Signed)
[facility] Progress Note   Subjective:  Seen at end of HD - net UF 2.2L and tolerated. BP still high, hasn't had his usual meds today. Breathing feels better.  No N/V/abdominal pain.  Objective Vitals:   05/19/22 1130 05/19/22 1200 05/19/22 1229 05/19/22 1230  BP: (!) 163/118 (!) 165/119 (!) 177/128   Pulse: 92 94 96 95  Resp: 14 14 14 12   Temp:   98.4 F (36.9 C)   TempSrc:   Oral   SpO2: 98% 99% 100% 100%  Weight:    64.7 kg  Height:       Physical Exam General: Well appearing man, NAD. Room air. Heart: Tachycardic, no murmur Lungs: CTA anteriorly Abdomen: soft, PD cath in L abdomen Extremities: No LE edema Dialysis Access:  TDC in R chest  Additional Objective Labs: Basic Metabolic Panel: Recent Labs  Lab 05/18/22 0744 05/18/22 1029 05/18/22 1835 05/19/22 0105  NA 136  --  134* 135  K >7.5*  --  4.1 5.1  CL 100  --  92* 94*  CO2 17*  --  23 24  GLUCOSE 82  --  187* 109*  BUN 119*  --  64* 80*  CREATININE 40.87*  --  24.42* 26.77*  CALCIUM 7.6*  --  8.1* 7.8*  PHOS  --  6.2*  --   --    Liver Function Tests: Recent Labs  Lab 05/18/22 1029 05/19/22 0105  AST  --  26  ALT  --  21  ALKPHOS  --  43  BILITOT  --  0.9  PROT  --  6.2*  ALBUMIN 3.4* 3.1*   CBC: Recent Labs  Lab 05/18/22 0744 05/18/22 0918 05/18/22 1835 05/19/22 0105  WBC 7.0 7.4 5.0 5.4  NEUTROABS 5.8 6.9  --   --   HGB 5.9* 6.2* 7.9* 7.2*  HCT 17.6* 18.8* 23.5* 20.1*  MCV 102.3* 102.7* 97.5 94.8  PLT 99* 96* 115* 102*   CBG: Recent Labs  Lab 05/18/22 1111 05/19/22 0820  GLUCAP 145* 129*   Iron Studies:  Recent Labs    05/18/22 1033  IRON 200*  TIBC 228*  FERRITIN 1,289*   Studies/Results: DG Chest 2 View  Result Date: 05/18/2022 CLINICAL DATA:  Chest pain and shortness of breath EXAM: CHEST - 2 VIEW COMPARISON:  01/06/2022 FINDINGS: Interstitial and airspace opacity with small right pleural effusion. Denser opacity at the right base but a bilateral  process. Cardiomegaly. Dialysis catheter with tip at the upper cavoatrial junction. No pneumothorax. IMPRESSION: Asymmetric edema versus right base pneumonia. Electronically Signed   By: Jorje Guild M.D.   On: 05/18/2022 07:41    Medications:   sodium chloride   Intravenous Once   carvedilol  6.25 mg Oral BID   Chlorhexidine Gluconate Cloth  6 each Topical Q0600   Chlorhexidine Gluconate Cloth  6 each Topical Q0600   cinacalcet  30 mg Oral Q breakfast   lanthanum  500 mg Oral TID WC    Dialysis Orders: CCPD - had recently started this, wants to go back to HD -> wants PD cath removed. Has spot at [facility] - MWF 11:35pm 4hr, 400/8--, EDW 68kg, 2K/2Ca, TDC, no heparin.  Assessment/Plan: Hyperkalemia: On admit - s/p temporizing measures and then HD. Better today. Uremia: Cr 40 on admit, had recently started HD but dislikes it and hadn't been doing it correctly. Changing back to HD -> s/p HD yesterday and again today. ESRD: HD 12/3, 12/4 -> spoke to  outpatient HT unit to see if needs full CLIP process but sounds like it was already arranged before this admit -> will be on MWF schedule at [facility]. Can start Wed 12/6 - on-time 11:35p, needs to be there at 11am on first day to sign paperwork. He was asking if PD catheter could be removed while he is here - unsure, will consult [facility] to ask. Next HD 12/6 if still here. 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 Secondary HPTH: CorrCa ok, Phos high - restart binders, HD will help. Continue PO sensipar. Dispo: Ok from renal standpoint if BP improves after taking Coreg today. If not, would ^ dose.   Veneta Penton, PA-C 05/19/2022, 12:43 PM  [facility]

## 2022-05-19 NOTE — Consult Note (Signed)
Hospital Consult    Reason for Consult: removal PD catheter, permanent access Referring Physician: Dr. Saverio Danker MRN #:  [id number]  History of Present Illness: This is a 56 y.o. male history of end-stage renal disease currently dialyzing via catheter.  He has a failed left upper arm AV fistula in the past.  Currently has a PD catheter in place but not using.  Currently is here with hyperkalemia which has resolved.  Really no complaints at the time of my exam today.  Does not take blood thinners he is on heparin with dialysis.  Past Medical History:  Diagnosis Date   CHF (congestive heart failure) (HCC)    Congenital renal agenesis, unilateral    Hypertension    Unintentional weight loss     Past Surgical History:  Procedure Laterality Date   AV FISTULA PLACEMENT Left 05/13/2021   Procedure: ARTERIOVENOUS (AV) FISTULA CREATION;  Surgeon: Broadus John, MD;  Location: [facility] OR;  Service: Vascular;  Laterality: Left;   CAPD INSERTION N/A 04/10/2022   Procedure: LAPAROSCOPIC INSERTION CONTINUOUS AMBULATORY PERITONEAL DIALYSIS  (CAPD) CATHETER WITH OMENTOPEXY;  Surgeon: Cherre Robins, MD;  Location: [place] OR;  Service: Vascular;  Laterality: N/A;   IR FLUORO GUIDE CV LINE RIGHT  07/15/2021   IR US GUIDE VASC ACCESS RIGHT  07/16/2021   RIGHT/LEFT HEART CATH AND CORONARY ANGIOGRAPHY N/A 02/18/2022   Procedure: RIGHT/LEFT HEART CATH AND CORONARY ANGIOGRAPHY;  Surgeon: Adrian Prows, MD;  Location: [place] [facility];  Service: Cardiovascular;  Laterality: N/A;    Allergies  Allergen Reactions   Nsaids Other (See Comments)    Patient was born with only 1 kidney and was told to not take NSAID(s)    Prior to Admission medications   Medication Sig Start Date End Date Taking? Authorizing Provider  B Complex-C-Folic Acid (DIALYVITE 664) 0.8 MG TABS Take 1 tablet by mouth daily.   Yes [provider]  calcitRIOL (ROCALTROL) 0.5 MCG capsule Take 0.5 mcg by mouth daily. 04/28/22  Yes [provider]  carvedilol (COREG) 6.25 MG tablet Take 1 tablet (6.25 mg total) by mouth 2 (two) times daily. 04/03/22  Yes Tolia, Sunit, DO  HYDROcodone-acetaminophen (NORCO) 5-325 MG tablet Take 1 tablet by mouth every 6 (six) hours as needed for moderate pain. 04/10/22  Yes Dagoberto Ligas, PA-C  SENSIPAR 30 MG tablet Take 30 mg by mouth daily with supper. 10/07/21  Yes [provider]  sucroferric oxyhydroxide (VELPHORO) 500 MG chewable tablet Chew 500 mg by mouth 3 (three) times daily with meals.   Yes [provider]    Social History   Socioeconomic History   Marital status: Divorced    Spouse name: Not on file   Number of children: 1   Years of education: Not on file   Highest education level: Not on file  Occupational History   Occupation: Landscaper  Tobacco Use   Smoking status: Former    Types: Cigars    Quit date: 04/2021    Years since quitting: 1.0   Smokeless tobacco: Never  Vaping Use   Vaping Use: Never used  Substance and Sexual Activity   Alcohol use: Yes    Comment: occ   Drug use: Not Currently    Types: Marijuana   Sexual activity: Yes  Other Topics Concern   Not on file  Social History Narrative   Works as a Development worker, 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 His father passed in March and his mother moved into a nursing facility, and  unfortunately he lost ownership of his family's house so has been experiencing housing instability since early summer. Occasionally resides with friend, also gets services from [facility]. Has slept outside as well.    Social Determinants of Health   Financial Resource Strain: High Risk (05/13/2021)   Overall Financial Resource Strain (CARDIA)    Difficulty of Paying Living Expenses: Very hard  Food Insecurity: Food Insecurity Present (05/13/2021)   Hunger Vital Sign    Worried About Running Out of Food in the Last Year: Often true    Ran Out of Food in the Last Year: Often true  Transportation Needs: Unmet Transportation Needs  (05/13/2021)   PRAPARE - Hydrologist (Medical): Yes    Lack of Transportation (Non-Medical): Yes  Physical Activity: Not on file  Stress: Not on file  Social Connections: Not on file  Intimate Partner Violence: Not on file     Family History  Problem Relation Age of Onset   Stroke Mother    Dementia Mother    Kidney disease Mother     Review of Systems  Constitutional: Negative.   HENT: Negative.    Eyes: Negative.   Respiratory: Negative.    Cardiovascular: Negative.   Gastrointestinal: Negative.   Musculoskeletal: Negative.   Skin: Negative.   Neurological: Negative.   Endo/Heme/Allergies: Negative.   Psychiatric/Behavioral: Negative.        Physical Examination  Vitals:   05/19/22 1230 05/19/22 1424  BP:  (!) 168/121  Pulse: 95 (!) 101  Resp: 12 15  Temp:  98.4 F (36.9 C)  SpO2: 100% 100%   Body mass index is 21.69 kg/m.  Physical Exam Constitutional:      Appearance: He is well-developed.  HENT:     Head: Normocephalic.  Eyes:     Pupils: Pupils are equal, round, and reactive to light.  Neck:     Comments: Tunneled right-sided catheter in place with dressing Cardiovascular:     Rate and Rhythm: Normal rate.     Pulses:          Radial pulses are 2+ on the right side and 2+ on the left side.  Pulmonary:     Effort: Pulmonary effort is normal.  Abdominal:     Palpations: Abdomen is soft.  Musculoskeletal:        General: Normal range of motion.     Cervical back: Normal range of motion.     Comments: Left upper arm with fistula that feels thrombosed  Skin:    General: Skin is warm.  Neurological:     General: No focal deficit present.     Mental Status: He is alert.      CBC    Component Value Date/Time   WBC 5.4 05/19/2022 0105   RBC 2.12 (L) 05/19/2022 0105   HGB 7.2 (L) 05/19/2022 0105   HCT 20.1 (L) 05/19/2022 0105   PLT 102 (L) 05/19/2022 0105   MCV 94.8 05/19/2022 0105   MCH 34.0 05/19/2022  0105   MCHC 35.8 05/19/2022 0105   RDW 16.0 (H) 05/19/2022 0105   LYMPHSABS 0.5 (L) 05/18/2022 0918   MONOABS 0.0 (L) 05/18/2022 0918   EOSABS 0.0 05/18/2022 0918   BASOSABS 0.0 05/18/2022 0918    BMET    Component Value Date/Time   NA 135 05/19/2022 0105   NA 138 05/22/2021 1048   K 5.1 05/19/2022 0105   CL 94 (L) 05/19/2022 0105   CO2 24 05/19/2022 0105  GLUCOSE 109 (H) 05/19/2022 0105   BUN 80 (H) 05/19/2022 0105   BUN 58 (H) 05/22/2021 1048   CREATININE 26.77 (H) 05/19/2022 0105   CALCIUM 7.8 (L) 05/19/2022 0105   GFRNONAA 2 (L) 05/19/2022 0105   GFRAA 14 (L) 07/22/2019 0250    COAGS: Lab Results  Component Value Date   INR 1.2 05/18/2022     Non-Invasive Vascular Imaging:   No new studies  ASSESSMENT/PLAN: This is a 56 y.o. male with end-stage renal disease on dialysis via catheter.  He was admitted with hyperkalemia this has resolved now with potassium within normal limits.  If patient is to be admitted we will plan to remove PD catheter tomorrow and also I discussed with him placing left arm AV fistula versus graft at the same time given that he has failed fistula on the left in the past.  No previous vein mapping of basilic vein this can be performed at the time of surgery as patient would likely need graft anyway.  He will be n.p.o. past midnight, if surgery needs to be delayed we can reschedule in the morning.  Jayleon Mcfarlane C. Donzetta Matters, MD [facility] Office: [phone] Pager: [phone]

## 2022-05-19 NOTE — H&P (View-Only) (Signed)
Hospital Consult    Reason for Consult: removal PD catheter, permanent access Referring Physician: Dr. Saverio Danker MRN #:  [id number]  History of Present Illness: This is a 56 y.o. male history of end-stage renal disease currently dialyzing via catheter.  He has a failed left upper arm AV fistula in the past.  Currently has a PD catheter in place but not using.  Currently is here with hyperkalemia which has resolved.  Really no complaints at the time of my exam today.  Does not take blood thinners he is on heparin with dialysis.  Past Medical History:  Diagnosis Date   CHF (congestive heart failure) (HCC)    Congenital renal agenesis, unilateral    Hypertension    Unintentional weight loss     Past Surgical History:  Procedure Laterality Date   AV FISTULA PLACEMENT Left 05/13/2021   Procedure: ARTERIOVENOUS (AV) FISTULA CREATION;  Surgeon: Broadus John, MD;  Location: [facility] OR;  Service: Vascular;  Laterality: Left;   CAPD INSERTION N/A 04/10/2022   Procedure: LAPAROSCOPIC INSERTION CONTINUOUS AMBULATORY PERITONEAL DIALYSIS  (CAPD) CATHETER WITH OMENTOPEXY;  Surgeon: Cherre Robins, MD;  Location: [place] OR;  Service: Vascular;  Laterality: N/A;   IR FLUORO GUIDE CV LINE RIGHT  07/15/2021   IR US GUIDE VASC ACCESS RIGHT  07/16/2021   RIGHT/LEFT HEART CATH AND CORONARY ANGIOGRAPHY N/A 02/18/2022   Procedure: RIGHT/LEFT HEART CATH AND CORONARY ANGIOGRAPHY;  Surgeon: Adrian Prows, MD;  Location: [place] [facility];  Service: Cardiovascular;  Laterality: N/A;    Allergies  Allergen Reactions   Nsaids Other (See Comments)    Patient was born with only 1 kidney and was told to not take NSAID(s)    Prior to Admission medications   Medication Sig Start Date End Date Taking? Authorizing Provider  B Complex-C-Folic Acid (DIALYVITE 751) 0.8 MG TABS Take 1 tablet by mouth daily.   Yes [provider]  calcitRIOL (ROCALTROL) 0.5 MCG capsule Take 0.5 mcg by mouth daily. 04/28/22  Yes [provider]  carvedilol (COREG) 6.25 MG tablet Take 1 tablet (6.25 mg total) by mouth 2 (two) times daily. 04/03/22  Yes Tolia, Sunit, DO  HYDROcodone-acetaminophen (NORCO) 5-325 MG tablet Take 1 tablet by mouth every 6 (six) hours as needed for moderate pain. 04/10/22  Yes Dagoberto Ligas, PA-C  SENSIPAR 30 MG tablet Take 30 mg by mouth daily with supper. 10/07/21  Yes [provider]  sucroferric oxyhydroxide (VELPHORO) 500 MG chewable tablet Chew 500 mg by mouth 3 (three) times daily with meals.   Yes [provider]    Social History   Socioeconomic History   Marital status: Divorced    Spouse name: Not on file   Number of children: 1   Years of education: Not on file   Highest education level: Not on file  Occupational History   Occupation: Landscaper  Tobacco Use   Smoking status: Former    Types: Cigars    Quit date: 04/2021    Years since quitting: 1.0   Smokeless tobacco: Never  Vaping Use   Vaping Use: Never used  Substance and Sexual Activity   Alcohol use: Yes    Comment: occ   Drug use: Not Currently    Types: Marijuana   Sexual activity: Yes  Other Topics Concern   Not on file  Social History Narrative   Works as a Development worker, 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 His father passed in March and his mother moved into a nursing facility, and  unfortunately he lost ownership of his family's house so has been experiencing housing instability since early summer. Occasionally resides with friend, also gets services from [facility]. Has slept outside as well.    Social Determinants of Health   Financial Resource Strain: High Risk (05/13/2021)   Overall Financial Resource Strain (CARDIA)    Difficulty of Paying Living Expenses: Very hard  Food Insecurity: Food Insecurity Present (05/13/2021)   Hunger Vital Sign    Worried About Running Out of Food in the Last Year: Often true    Ran Out of Food in the Last Year: Often true  Transportation Needs: Unmet Transportation Needs  (05/13/2021)   PRAPARE - Hydrologist (Medical): Yes    Lack of Transportation (Non-Medical): Yes  Physical Activity: Not on file  Stress: Not on file  Social Connections: Not on file  Intimate Partner Violence: Not on file     Family History  Problem Relation Age of Onset   Stroke Mother    Dementia Mother    Kidney disease Mother     Review of Systems  Constitutional: Negative.   HENT: Negative.    Eyes: Negative.   Respiratory: Negative.    Cardiovascular: Negative.   Gastrointestinal: Negative.   Musculoskeletal: Negative.   Skin: Negative.   Neurological: Negative.   Endo/Heme/Allergies: Negative.   Psychiatric/Behavioral: Negative.        Physical Examination  Vitals:   05/19/22 1230 05/19/22 1424  BP:  (!) 168/121  Pulse: 95 (!) 101  Resp: 12 15  Temp:  98.4 F (36.9 C)  SpO2: 100% 100%   Body mass index is 21.69 kg/m.  Physical Exam Constitutional:      Appearance: He is well-developed.  HENT:     Head: Normocephalic.  Eyes:     Pupils: Pupils are equal, round, and reactive to light.  Neck:     Comments: Tunneled right-sided catheter in place with dressing Cardiovascular:     Rate and Rhythm: Normal rate.     Pulses:          Radial pulses are 2+ on the right side and 2+ on the left side.  Pulmonary:     Effort: Pulmonary effort is normal.  Abdominal:     Palpations: Abdomen is soft.  Musculoskeletal:        General: Normal range of motion.     Cervical back: Normal range of motion.     Comments: Left upper arm with fistula that feels thrombosed  Skin:    General: Skin is warm.  Neurological:     General: No focal deficit present.     Mental Status: He is alert.      CBC    Component Value Date/Time   WBC 5.4 05/19/2022 0105   RBC 2.12 (L) 05/19/2022 0105   HGB 7.2 (L) 05/19/2022 0105   HCT 20.1 (L) 05/19/2022 0105   PLT 102 (L) 05/19/2022 0105   MCV 94.8 05/19/2022 0105   MCH 34.0 05/19/2022  0105   MCHC 35.8 05/19/2022 0105   RDW 16.0 (H) 05/19/2022 0105   LYMPHSABS 0.5 (L) 05/18/2022 0918   MONOABS 0.0 (L) 05/18/2022 0918   EOSABS 0.0 05/18/2022 0918   BASOSABS 0.0 05/18/2022 0918    BMET    Component Value Date/Time   NA 135 05/19/2022 0105   NA 138 05/22/2021 1048   K 5.1 05/19/2022 0105   CL 94 (L) 05/19/2022 0105   CO2 24 05/19/2022 0105  GLUCOSE 109 (H) 05/19/2022 0105   BUN 80 (H) 05/19/2022 0105   BUN 58 (H) 05/22/2021 1048   CREATININE 26.77 (H) 05/19/2022 0105   CALCIUM 7.8 (L) 05/19/2022 0105   GFRNONAA 2 (L) 05/19/2022 0105   GFRAA 14 (L) 07/22/2019 0250    COAGS: Lab Results  Component Value Date   INR 1.2 05/18/2022     Non-Invasive Vascular Imaging:   No new studies  ASSESSMENT/PLAN: This is a 56 y.o. male with end-stage renal disease on dialysis via catheter.  He was admitted with hyperkalemia this has resolved now with potassium within normal limits.  If patient is to be admitted we will plan to remove PD catheter tomorrow and also I discussed with him placing left arm AV fistula versus graft at the same time given that he has failed fistula on the left in the past.  No previous vein mapping of basilic vein this can be performed at the time of surgery as patient would likely need graft anyway.  He will be n.p.o. past midnight, if surgery needs to be delayed we can reschedule in the morning.  Aza Dantes C. Donzetta Matters, MD [facility] Office: [phone] Pager: [phone]

## 2022-05-19 NOTE — Progress Notes (Signed)
Physician Assistant, Stefani Dama RN Clarene Critchley to keep pt. On his regular Hemodialysis treatment of 3.25 Hours instead of switching pt. To 3 Hours treatment.

## 2022-05-19 NOTE — Progress Notes (Signed)
Subjective:  Patient feeling significantly improved this morning. Denies cough, chills, nausea/vomiting, diarrhea, chest pain. Shortness of breath and fatigue still present but much better today.   Objective:  Vital signs in last 24 hours: Vitals:   05/19/22 1130 05/19/22 1200 05/19/22 1229 05/19/22 1230  BP: (!) 163/118 (!) 165/119 (!) 177/128   Pulse: 92 94 96 95  Resp: 14 14 14 12   Temp:   98.4 F (36.9 C)   TempSrc:   Oral   SpO2: 98% 99% 100% 100%  Weight:    64.7 kg  Height:        Physical Exam:  Constitutional: well-appearing gentleman receiving HD through right tunneled catheter in no distress CV: Tachycardic, regular rhythm, no murmurs Pulm: No increased work of breathing on room air, clear to auscultation bilaterally, exam limited by patient position receiving HD Abdominal: soft, non-distended, PD cath L abdomen Extremities: Trace bilateral lower extremity edema  Labs     Latest Ref Rng & Units 05/19/2022    1:05 AM 05/18/2022    6:35 PM 05/18/2022    9:18 AM  CBC  WBC 4.0 - 10.5 K/uL 5.4  5.0  7.4   Hemoglobin 13.0 - 17.0 g/dL 7.2  7.9  6.2   Hematocrit 39.0 - 52.0 % 20.1  23.5  18.8   Platelets 150 - 400 K/uL 102  115  96        Latest Ref Rng & Units 05/19/2022    1:05 AM 05/18/2022    6:35 PM 05/18/2022    7:44 AM  CMP  Glucose 70 - 99 mg/dL 109  187  82   BUN 6 - 20 mg/dL 80  64  119   Creatinine 0.61 - 1.24 mg/dL 26.77  24.42  40.87   Sodium 135 - 145 mmol/L 135  134  136   Potassium 3.5 - 5.1 mmol/L 5.1  4.1  >7.5   Chloride 98 - 111 mmol/L 94  92  100   CO2 22 - 32 mmol/L 24  23  17    Calcium 8.9 - 10.3 mg/dL 7.8  8.1  7.6   Total Protein 6.5 - 8.1 g/dL 6.2     Total Bilirubin 0.3 - 1.2 mg/dL 0.9     Alkaline Phos 38 - 126 U/L 43     AST 15 - 41 U/L 26     ALT 0 - 44 U/L 21      Iron/TIBC/Ferritin/ %Sat    Component Value Date/Time   IRON 200 (H) 05/18/2022 1033   TIBC 228 (L) 05/18/2022 1033   FERRITIN 1,289 (H) 05/18/2022 1033    IRONPCTSAT 88 (H) 05/18/2022 1033   Last vitamin B12 and Folate Lab Results  Component Value Date   VITAMINB12 590 05/18/2022   FOLATE 28.5 05/18/2022   Reticulocytes:  - Absolute retic count: 19.5 - Retic Ct pct: 1.1  Procalcitonin:  0.53  LDH: 193  Smear: WBC, RBC, platelets - unremarkable morphology  Anion gap: 17  Summary Phillip Lynch is a 56 year old male 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   Assessment/Plan:  Principal Problem:   Hyperkalemia Active Problems:   HFrEF (heart failure with reduced ejection fraction) (HCC)   End stage renal disease on dialysis (HCC)   Anemia   Thrombocytopenia (HCC)   Acute hypoxic respiratory failure (HCC)   Elevated troponin  ESRD  Hyperkalemia (resolved) Uremia (resolving)  Patient switched from HD to  PD 2 weeks ago but did not tolerate PD. Was supposed to restart HD outpatient today, but presented with K>7.5 yesterday. Received dialysis yesterday (3L removed and today 2.2L removed). Patient has outpatient HD bed at [facility] MWF, per nephrology is okay to discharge and resume HD outpatient on 12/6. Of note, patient reported being on Norco, was prescribed after placement of peritoneal dialysis catheter on 10/26. Plan to confirm with patient tomorrow that this is not appropriate long term regimen for patient to be on for pain control.  - Next HD 12/6 - am BMP  Macrocytic Anemia Thrombocytopenia Hemoglobin 13.6 in October, was 5.9 on admission. Received 1 unit pRBC yesterday with dialysis. Hemoglobin 7.2 this morning, will check again tomorrow. Reticulocyte index calculated as 0.26, indicating hypoproliferation, consistent with anemia of ESRD. LDH is elevated, but less concern for hemolysis given normal smear, reticulocyte count, bilirubin, and RDW. Haptoglobin still pending. No obvious source of bleeding, fobt  negative. Consider GI consult if significant drop tomorrow.  - haptoglobin pending - am CBC  HTN BP has remained elevated during admission, but patient did not receive carvedilol last night or this morning. Discussed with nursing staff. - carvedilol 6.25 BID  Chest pain (resolved) Elevated troponin Troponins trended down. Chest pain resolved this morning. No concern for ACS at this time.   Chronic HFrEF Nonischemic cardiomyopathy Echo from 2022 showed EF of 20-25%, in 11/2021 was 30-35%. A PCV Echo from 05/2022 showed EF 40-45%. Patient had volume overload on cxr at admission, HD yesterday and today, and feels less short of breath. Still trace edema in bilateral lower extremities. Will continue to monitor volume status on exam.    DIET: renal IVF: none DVT PPX: SCD BOWEL: none CODE: FULL FAM COM: none   PT/OT recs: not consulted Dispo: home Barriers to discharge: clinical improvement    LOS: 1 day   Annia Belt, Medical Student 05/19/2022, 12:48 PM

## 2022-05-19 NOTE — ED Notes (Signed)
Pt transported to Hemodialysis.  

## 2022-05-19 NOTE — Hospital Course (Signed)
Phillip Lynch is a 56 year old male 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    ESRD  Hyperkalemia (resolved) Uremia (resolving)  Patient switched from HD to PD 2 weeks ago but did not tolerate PD. Was supposed to restart HD outpatient, but presented to ED with K>7.5, Cr 40, BUN 119. Received HD x2 (3L removed and today 2.2L removed). Patient has outpatient HD bed at [facility] MWF, per nephrology is okay to discharge and resume HD outpatient on 12/6. By day of discharge, K normalized, BUN and Cr trending down, will likely continue to normalize with outpatient HD.  Macrocytic Anemia Thrombocytopenia Hemoglobin 13.6 in October, was 5.9 on admission. Received 1 unit pRBC with dialysis and mircera. Reticulocyte index calculated as 0.26, indicating hypoproliferation, consistent with anemia of ESRD. LDH is elevated, but less concern for hemolysis given normal smear, reticulocyte count, bilirubin, and RDW. Haptoglobin still pending. No obvious source of bleeding, fobt negative. After transfusion, hemoglobin stable, 7.9>7.2>7.9.   HTN BP has remained elevated during admission, though of note patient missed home carvedilol x1 and received the next dose late. Patient reports that it has remained elevated since he switched to PD and had poor tolerance of it, so likely component of hypervolemia. Advised patient to continue home carvedilol and outpatient HD and follow up with PCP to determine need for any htn regimen changes.  Chest pain (resolved) Elevated troponin Troponins trended down 143>121. Chest pain resolved. No concern for ACS.  Chronic HFrEF Nonischemic cardiomyopathy Echo from 2022 showed EF of 20-25%, in 11/2021 was 30-35%. A PCV Echo from 05/2022 showed EF 40-45%. Patient had volume overload on cxr at admission, received HD x 2. Patient is ineligible for most GDMT agents given his  ESRD and congenital renal agenesis.    05/22/2022 Patient notes that he is feeling better, he he questions about his elevated blood pressure and heart rate. He states that he has the abdominal pain but only when he bumps It or touches it. He reports that while he is at rest he has no pain. Patient is understanding about the GI evaluation prior to discharge. He reports that he was due for a colonoscopy, but never got it as it got pushed back due to his dialysis issues that he was having. He has not had a bowel movement in the past few days.

## 2022-05-19 NOTE — Progress Notes (Signed)
   05/19/22 1227  Neurological  Level of Consciousness Alert  Orientation Level Oriented X4  Respiratory  Respiratory Pattern Regular  Chest Assessment Chest expansion symmetrical  Bilateral Breath Sounds Clear  Cardiac  Pulse Regular  Cardiac Rhythm NSR   Received patient in bed to unit.  Alert and oriented.  Informed consent signed and in chart.   Treatment initiated: 0843A Treatment completed: 1227p  Patient tolerated well.  Transported back to the room  Alert, without acute distress.  Hand-off given to patient's nurse.   Access used: Yes Access issues: Yes, pt catheter is repositional and you have to lay him flat, and flushed the catheter  Total UF removed: 2200 Medication(s) given: None Post HD VS: 98.4, 171/122, 99, 22, O2 100 R/A Post HD weight: 64.7kg   [facility]

## 2022-05-20 ENCOUNTER — Other Ambulatory Visit: Payer: Self-pay

## 2022-05-20 ENCOUNTER — Inpatient Hospital Stay (HOSPITAL_COMMUNITY): Payer: Medicare Other | Admitting: Certified Registered Nurse Anesthetist

## 2022-05-20 ENCOUNTER — Encounter (HOSPITAL_COMMUNITY)
Admission: EM | Disposition: A | Discharge status: Home / Self Care | Payer: Self-pay | Source: Home / Self Care | Attending: Internal Medicine

## 2022-05-20 ENCOUNTER — Encounter (HOSPITAL_COMMUNITY): Payer: Self-pay | Admitting: Infectious Diseases

## 2022-05-20 DIAGNOSIS — I509 Heart failure, unspecified: Secondary | ICD-10-CM

## 2022-05-20 DIAGNOSIS — Z992 Dependence on renal dialysis: Secondary | ICD-10-CM

## 2022-05-20 DIAGNOSIS — I5020 Unspecified systolic (congestive) heart failure: Secondary | ICD-10-CM | POA: Diagnosis not present

## 2022-05-20 DIAGNOSIS — N186 End stage renal disease: Secondary | ICD-10-CM

## 2022-05-20 DIAGNOSIS — D539 Nutritional anemia, unspecified: Secondary | ICD-10-CM

## 2022-05-20 DIAGNOSIS — I132 Hypertensive heart and chronic kidney disease with heart failure and with stage 5 chronic kidney disease, or end stage renal disease: Secondary | ICD-10-CM

## 2022-05-20 DIAGNOSIS — Z87891 Personal history of nicotine dependence: Secondary | ICD-10-CM

## 2022-05-20 HISTORY — PX: AV FISTULA PLACEMENT: SHX1204

## 2022-05-20 HISTORY — PX: CAPD INSERTION: SHX5233

## 2022-05-20 LAB — CBC
HCT: 24.7 % — ABNORMAL LOW (ref 39.0–52.0)
Hemoglobin: 7.9 g/dL — ABNORMAL LOW (ref 13.0–17.0)
MCH: 32.4 pg (ref 26.0–34.0)
MCHC: 32 g/dL (ref 30.0–36.0)
MCV: 101.2 fL — ABNORMAL HIGH (ref 80.0–100.0)
Platelets: UNDETERMINED 10*3/uL (ref 150–400)
RBC: 2.44 MIL/uL — ABNORMAL LOW (ref 4.22–5.81)
RDW: 15.7 % — ABNORMAL HIGH (ref 11.5–15.5)
WBC: 3.5 10*3/uL — ABNORMAL LOW (ref 4.0–10.5)
nRBC: 0 % (ref 0.0–0.2)

## 2022-05-20 LAB — CBG MONITORING, ED: Glucose-Capillary: 92 mg/dL (ref 70–99)

## 2022-05-20 LAB — BASIC METABOLIC PANEL
Anion gap: 16 — ABNORMAL HIGH (ref 5–15)
BUN: 53 mg/dL — ABNORMAL HIGH (ref 6–20)
CO2: 21 mmol/L — ABNORMAL LOW (ref 22–32)
Calcium: 7.8 mg/dL — ABNORMAL LOW (ref 8.9–10.3)
Chloride: 99 mmol/L (ref 98–111)
Creatinine, Ser: 17.16 mg/dL — ABNORMAL HIGH (ref 0.61–1.24)
GFR, Estimated: 3 mL/min — ABNORMAL LOW (ref >60)
Glucose, Bld: 88 mg/dL (ref 70–99)
Potassium: 4.6 mmol/L (ref 3.5–5.1)
Sodium: 136 mmol/L (ref 135–145)

## 2022-05-20 LAB — HEPATITIS B SURFACE ANTIBODY, QUANTITATIVE: Hep B S AB Quant (Post): >1000 m[IU]/mL (ref >9.9)

## 2022-05-20 SURGERY — ARTERIOVENOUS (AV) FISTULA CREATION
Anesthesia: General | Site: Arm Upper | Laterality: Left

## 2022-05-20 MED ORDER — LIDOCAINE-EPINEPHRINE (PF) 1 %-1:200000 IJ SOLN
INTRAMUSCULAR | Status: AC
  Filled 2022-05-20: qty 30

## 2022-05-20 MED ORDER — HEPARIN SODIUM (PORCINE) 1000 UNIT/ML IJ SOLN
INTRAMUSCULAR | Status: AC
  Filled 2022-05-20: qty 10

## 2022-05-20 MED ORDER — OXYCODONE-ACETAMINOPHEN 5-325 MG PO TABS
1.0000 | ORAL_TABLET | ORAL | Status: DC | PRN
  Administered 2022-05-20 – 2022-05-21 (×4): 2 via ORAL
  Filled 2022-05-20 (×4): qty 2

## 2022-05-20 MED ORDER — EPHEDRINE SULFATE-NACL 50-0.9 MG/10ML-% IV SOSY
PREFILLED_SYRINGE | INTRAVENOUS | Status: DC | PRN
  Administered 2022-05-20 (×4): 5 mg via INTRAVENOUS

## 2022-05-20 MED ORDER — FENTANYL CITRATE (PF) 250 MCG/5ML IJ SOLN
INTRAMUSCULAR | Status: AC
  Filled 2022-05-20: qty 5

## 2022-05-20 MED ORDER — HEPARIN SODIUM (PORCINE) 1000 UNIT/ML IJ SOLN
INTRAMUSCULAR | Status: AC
  Filled 2022-05-20: qty 20

## 2022-05-20 MED ORDER — PHENYLEPHRINE 80 MCG/ML (10ML) SYRINGE FOR IV PUSH (FOR BLOOD PRESSURE SUPPORT)
PREFILLED_SYRINGE | INTRAVENOUS | Status: DC | PRN
  Administered 2022-05-20 (×3): 160 ug via INTRAVENOUS
  Administered 2022-05-20: 80 ug via INTRAVENOUS

## 2022-05-20 MED ORDER — FENTANYL CITRATE (PF) 100 MCG/2ML IJ SOLN
25.0000 ug | INTRAMUSCULAR | Status: DC | PRN
  Administered 2022-05-20 (×2): 25 ug via INTRAVENOUS

## 2022-05-20 MED ORDER — MORPHINE SULFATE (PF) 4 MG/ML IV SOLN
4.0000 mg | INTRAVENOUS | Status: DC | PRN
  Administered 2022-05-20: 4 mg via INTRAVENOUS
  Filled 2022-05-20: qty 1

## 2022-05-20 MED ORDER — LIDOCAINE-EPINEPHRINE (PF) 1 %-1:200000 IJ SOLN
INTRAMUSCULAR | Status: DC | PRN
  Administered 2022-05-20: 20 mL

## 2022-05-20 MED ORDER — PHENYLEPHRINE HCL-NACL 20-0.9 MG/250ML-% IV SOLN
INTRAVENOUS | Status: DC | PRN
  Administered 2022-05-20: 50 ug/min via INTRAVENOUS

## 2022-05-20 MED ORDER — CHLORHEXIDINE GLUCONATE 0.12 % MT SOLN: 15.0000 mL | Freq: Once | OROMUCOSAL | Status: AC

## 2022-05-20 MED ORDER — PROTAMINE SULFATE 10 MG/ML IV SOLN
INTRAVENOUS | Status: AC
  Filled 2022-05-20: qty 10

## 2022-05-20 MED ORDER — OXYCODONE HCL 5 MG/5ML PO SOLN: 5.0000 mg | Freq: Once | ORAL | Status: DC | PRN

## 2022-05-20 MED ORDER — CARVEDILOL 6.25 MG PO TABS
6.2500 mg | ORAL_TABLET | Freq: Once | ORAL | Status: AC
  Administered 2022-05-20: 6.25 mg via ORAL
  Filled 2022-05-20 (×2): qty 1

## 2022-05-20 MED ORDER — FENTANYL CITRATE (PF) 250 MCG/5ML IJ SOLN
INTRAMUSCULAR | Status: DC | PRN
  Administered 2022-05-20: 50 ug via INTRAVENOUS
  Administered 2022-05-20: 100 ug via INTRAVENOUS

## 2022-05-20 MED ORDER — FENTANYL CITRATE (PF) 100 MCG/2ML IJ SOLN
INTRAMUSCULAR | Status: AC
  Filled 2022-05-20: qty 2

## 2022-05-20 MED ORDER — HEPARIN 6000 UNIT IRRIGATION SOLUTION
Status: DC | PRN
  Administered 2022-05-20: 1

## 2022-05-20 MED ORDER — OXYCODONE HCL 5 MG PO TABS: 5.0000 mg | ORAL_TABLET | Freq: Once | ORAL | Status: DC | PRN

## 2022-05-20 MED ORDER — CEFAZOLIN SODIUM-DEXTROSE 2-3 GM-%(50ML) IV SOLR
INTRAVENOUS | Status: DC | PRN
  Administered 2022-05-20: 2 g via INTRAVENOUS

## 2022-05-20 MED ORDER — ONDANSETRON HCL 4 MG/2ML IJ SOLN
INTRAMUSCULAR | Status: DC | PRN
  Administered 2022-05-20: 4 mg via INTRAVENOUS

## 2022-05-20 MED ORDER — PROTAMINE SULFATE 10 MG/ML IV SOLN
INTRAVENOUS | Status: DC | PRN
  Administered 2022-05-20: 40 mg via INTRAVENOUS

## 2022-05-20 MED ORDER — CHLORHEXIDINE GLUCONATE 0.12 % MT SOLN
OROMUCOSAL | Status: AC
  Administered 2022-05-20: 15 mL via OROMUCOSAL
  Filled 2022-05-20: qty 15

## 2022-05-20 MED ORDER — ALBUMIN HUMAN 5 % IV SOLN: INTRAVENOUS | Status: DC | PRN

## 2022-05-20 MED ORDER — PHENYLEPHRINE 80 MCG/ML (10ML) SYRINGE FOR IV PUSH (FOR BLOOD PRESSURE SUPPORT)
PREFILLED_SYRINGE | INTRAVENOUS | Status: AC
  Filled 2022-05-20: qty 20

## 2022-05-20 MED ORDER — SUGAMMADEX SODIUM 200 MG/2ML IV SOLN
INTRAVENOUS | Status: DC | PRN
  Administered 2022-05-20: 200 mg via INTRAVENOUS

## 2022-05-20 MED ORDER — LIDOCAINE 2% (20 MG/ML) 5 ML SYRINGE
INTRAMUSCULAR | Status: AC
  Filled 2022-05-20: qty 20

## 2022-05-20 MED ORDER — ROCURONIUM BROMIDE 100 MG/10ML IV SOLN
INTRAVENOUS | Status: DC | PRN
  Administered 2022-05-20: 60 mg via INTRAVENOUS

## 2022-05-20 MED ORDER — LIDOCAINE 2% (20 MG/ML) 5 ML SYRINGE
INTRAMUSCULAR | Status: DC | PRN
  Administered 2022-05-20: 60 mg via INTRAVENOUS

## 2022-05-20 MED ORDER — MIDAZOLAM HCL 2 MG/2ML IJ SOLN
INTRAMUSCULAR | Status: AC
  Filled 2022-05-20: qty 2

## 2022-05-20 MED ORDER — EPHEDRINE 5 MG/ML INJ
INTRAVENOUS | Status: AC
  Filled 2022-05-20: qty 5

## 2022-05-20 MED ORDER — 0.9 % SODIUM CHLORIDE (POUR BTL) OPTIME
TOPICAL | Status: DC | PRN
  Administered 2022-05-20: 1000 mL

## 2022-05-20 MED ORDER — CEFAZOLIN SODIUM 1 G IJ SOLR
INTRAMUSCULAR | Status: AC
  Filled 2022-05-20: qty 20

## 2022-05-20 MED ORDER — HEPARIN SODIUM (PORCINE) 1000 UNIT/ML IJ SOLN
INTRAMUSCULAR | Status: DC | PRN
  Administered 2022-05-20: 6000 [IU] via INTRAVENOUS

## 2022-05-20 MED ORDER — PROPOFOL 10 MG/ML IV BOLUS
INTRAVENOUS | Status: DC | PRN
  Administered 2022-05-20: 40 mg via INTRAVENOUS
  Administered 2022-05-20: 100 mg via INTRAVENOUS

## 2022-05-20 MED ORDER — GLYCOPYRROLATE PF 0.2 MG/ML IJ SOSY
PREFILLED_SYRINGE | INTRAMUSCULAR | Status: AC
  Filled 2022-05-20: qty 2

## 2022-05-20 MED ORDER — HEPARIN 6000 UNIT IRRIGATION SOLUTION
Status: AC
  Filled 2022-05-20: qty 500

## 2022-05-20 MED ORDER — SODIUM CHLORIDE 0.9 % IV SOLN: INTRAVENOUS | Status: DC

## 2022-05-20 MED ORDER — PROTAMINE SULFATE 10 MG/ML IV SOLN
INTRAVENOUS | Status: AC
  Filled 2022-05-20: qty 5

## 2022-05-20 MED ORDER — ONDANSETRON HCL 4 MG/2ML IJ SOLN: 4.0000 mg | Freq: Four times a day (QID) | INTRAMUSCULAR | Status: DC | PRN

## 2022-05-20 MED ORDER — ORAL CARE MOUTH RINSE: 15.0000 mL | Freq: Once | OROMUCOSAL | Status: AC

## 2022-05-20 SURGICAL SUPPLY — 66 items
ARMBAND PINK RESTRICT EXTREMIT (MISCELLANEOUS) ×5 IMPLANT
BAG COUNTER SPONGE SURGICOUNT (BAG) ×3 IMPLANT
BAG DECANTER FOR FLEXI CONT (MISCELLANEOUS) ×2 IMPLANT
BIOPATCH RED 1 DISK 7.0 (GAUZE/BANDAGES/DRESSINGS) ×2 IMPLANT
CANISTER SUCT 3000ML PPV (MISCELLANEOUS) ×3 IMPLANT
CANNULA VESSEL 3MM 2 BLNT TIP (CANNULA) ×3 IMPLANT
CATH PALINDROME-P 19CM W/VT (CATHETERS) IMPLANT
CATH PALINDROME-P 23CM W/VT (CATHETERS) IMPLANT
CATH PALINDROME-P 28CM W/VT (CATHETERS) IMPLANT
CHLORAPREP W/TINT 26 (MISCELLANEOUS) ×2 IMPLANT
CLIP TI WIDE RED SMALL 24 (CLIP) ×1 IMPLANT
CLIP VESOCCLUDE MED 6/CT (CLIP) ×3 IMPLANT
CLIP VESOCCLUDE SM WIDE 6/CT (CLIP) ×3 IMPLANT
COVER PROBE W GEL 5X96 (DRAPES) IMPLANT
COVER SURGICAL LIGHT HANDLE (MISCELLANEOUS) ×2 IMPLANT
DERMABOND ADVANCED .7 DNX12 (GAUZE/BANDAGES/DRESSINGS) ×4 IMPLANT
DRAPE C-ARM 42X72 X-RAY (DRAPES) ×2 IMPLANT
DRAPE CHEST BREAST 15X10 FENES (DRAPES) ×2 IMPLANT
DRAPE LAPAROSCOPIC ABDOMINAL (DRAPES) ×1 IMPLANT
DRSG OPSITE 4X5.5 SM (GAUZE/BANDAGES/DRESSINGS) ×1 IMPLANT
ELECT REM PT RETURN 9FT ADLT (ELECTROSURGICAL) ×3
ELECTRODE REM PT RTRN 9FT ADLT (ELECTROSURGICAL) ×3 IMPLANT
GAUZE 4X4 16PLY ~~LOC~~+RFID DBL (SPONGE) ×2 IMPLANT
GAUZE SPONGE 2X2 STRL 8-PLY (GAUZE/BANDAGES/DRESSINGS) ×1 IMPLANT
GAUZE SPONGE 4X4 16PLY XRAY LF (GAUZE/BANDAGES/DRESSINGS) ×1 IMPLANT
GLOVE BIO SURGEON STRL SZ7.5 (GLOVE) ×3 IMPLANT
GLOVE BIOGEL PI IND STRL 8 (GLOVE) ×3 IMPLANT
GLOVE SURG POLY ORTHO LF SZ7.5 (GLOVE) IMPLANT
GLOVE SURG UNDER LTX SZ8 (GLOVE) ×3 IMPLANT
GOWN STRL REUS W/ TWL LRG LVL3 (GOWN DISPOSABLE) ×9 IMPLANT
GOWN STRL REUS W/TWL LRG LVL3 (GOWN DISPOSABLE) ×9
KIT BASIN OR (CUSTOM PROCEDURE TRAY) ×3 IMPLANT
KIT PALINDROME-P 55CM (CATHETERS) IMPLANT
KIT TURNOVER KIT B (KITS) ×3 IMPLANT
LOOP VASCULAR MINI 18 RED (MISCELLANEOUS) ×3
NDL 18GX1X1/2 (RX/OR ONLY) (NEEDLE) ×2 IMPLANT
NDL HYPO 25GX1X1/2 BEV (NEEDLE) ×2 IMPLANT
NEEDLE 18GX1X1/2 (RX/OR ONLY) (NEEDLE) IMPLANT
NEEDLE HYPO 25GX1X1/2 BEV (NEEDLE) IMPLANT
NS IRRIG 1000ML POUR BTL (IV SOLUTION) ×3 IMPLANT
PACK BASIC III (CUSTOM PROCEDURE TRAY)
PACK CV ACCESS (CUSTOM PROCEDURE TRAY) ×3 IMPLANT
PACK SRG BSC III STRL LF ECLPS (CUSTOM PROCEDURE TRAY) ×2 IMPLANT
PAD ARMBOARD 7.5X6 YLW CONV (MISCELLANEOUS) ×6 IMPLANT
SLING ARM FOAM STRAP LRG (SOFTGOODS) IMPLANT
SLING ARM FOAM STRAP MED (SOFTGOODS) IMPLANT
SPIKE FLUID TRANSFER (MISCELLANEOUS) ×2 IMPLANT
SPONGE SURGIFOAM ABS GEL 100 (HEMOSTASIS) IMPLANT
SUT ETHILON 3 0 PS 1 (SUTURE) ×2 IMPLANT
SUT MNCRL AB 4-0 PS2 18 (SUTURE) ×7 IMPLANT
SUT PROLENE 6 0 BV (SUTURE) ×3 IMPLANT
SUT SILK 2 0 SH (SUTURE) ×1 IMPLANT
SUT SILK 3 0 (SUTURE) ×3
SUT SILK 3-0 18XBRD TIE 12 (SUTURE) ×1 IMPLANT
SUT VIC AB 3-0 SH 27 (SUTURE) ×15
SUT VIC AB 3-0 SH 27X BRD (SUTURE) ×7 IMPLANT
SYR 10ML LL (SYRINGE) ×2 IMPLANT
SYR 20ML LL LF (SYRINGE) ×4 IMPLANT
SYR 5ML LL (SYRINGE) ×4 IMPLANT
SYR CONTROL 10ML LL (SYRINGE) ×2 IMPLANT
TAPE UMBILICAL 1/8X30 (MISCELLANEOUS) ×1 IMPLANT
TOWEL GREEN STERILE (TOWEL DISPOSABLE) ×6 IMPLANT
TOWEL GREEN STERILE FF (TOWEL DISPOSABLE) ×3 IMPLANT
UNDERPAD 30X36 HEAVY ABSORB (UNDERPADS AND DIAPERS) ×3 IMPLANT
VASCULAR TIE MINI RED 18IN STL (MISCELLANEOUS) ×1 IMPLANT
WATER STERILE IRR 1000ML POUR (IV SOLUTION) ×3 IMPLANT

## 2022-05-20 NOTE — Anesthesia Preprocedure Evaluation (Addendum)
Anesthesia Evaluation  Patient identified by MRN, date of birth, ID band Patient awake    Reviewed: Allergy & Precautions, H&P , NPO status , Patient's Chart, lab work & pertinent test results  History of Anesthesia Complications Negative for: history of anesthetic complications  Airway Mallampati: III   Neck ROM: full    Dental  (+) Teeth Intact, Dental Advisory Given   Pulmonary former smoker   breath sounds clear to auscultation       Cardiovascular hypertension, Pt. on medications and Pt. on home beta blockers (-) angina +CHF   Rhythm:regular Rate:Normal   1. Left ventricular ejection fraction, by estimation, is 30 to 35%. The  left ventricle has moderately decreased function. The left ventricle  demonstrates global hypokinesis. There is moderate left ventricular  hypertrophy. Left ventricular diastolic  function could not be evaluated.   2. Right ventricular systolic function is normal. The right ventricular  size is normal. There is normal pulmonary artery systolic pressure.   3. Left atrial size was mildly dilated.   4. The mitral valve is normal in structure. No evidence of mitral valve  regurgitation. No evidence of mitral stenosis.   5. The aortic valve is tricuspid. Aortic valve regurgitation is mild. No  aortic stenosis is present.   6. The inferior vena cava is normal in size with greater than 50%  respiratory variability, suggesting right atrial pressure of 3 mmHg.   7. 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      There is mild left ventricular systolic dysfunction.   LV end diastolic pressure is normal.   The left ventricular ejection fraction is 45-50% by visual estimate.   There is no mitral valve regurgitation.   Right & Left Heart Catheterization 02/18/22:  LV: 147/1, EDP 11 mmHg.  Ao 145/82, mean 112 mmHg.  No pressure gradient across the aortic  valve. LVEF 45% with global hypokinesis.  No significant mitral regurgitation. LM: Large-caliber vessel, normal. LAD: Gives origin to large D1 and D2.  The LAD wraps around the apex and supplies most of the posterior wall, type IV LAD.  It is smooth and normal. LCx: Moderate caliber vessel, gives origin to small PDA branch.  2 small marginal branches.  Smooth and normal. RI: Moderate caliber vessel, smooth and normal.   RA 13/12, mean 12 mmHg RV 29/7, EDP 12 mmHg PA 29/12, mean 24 mmHg. PW 24/21, mean 18 mmHg.  Ao saturation 97%, PA saturation 69%.  DP/+1.00 and CO 5.31, CI 3.01, normal.   Impression: Findings consistent with nonischemic cardiomyopathy with mild LV systolic dysfunction.  Preserved cardiac output and cardiac index without evidence of pulmonary hypertension.  No intracardiac shunting.  35 mill contrast utilized.     Neuro/Psych negative neurological ROS  negative psych ROS   GI/Hepatic negative GI ROS, Neg liver ROS,,,  Endo/Other  negative endocrine ROS    Renal/GU ESRF and DialysisRenal disease     Musculoskeletal negative musculoskeletal ROS (+)    Abdominal   Peds  Hematology  (+) Blood dyscrasia, anemia Hemoglobin 7.9  Lab Results      Component                Value               Date                      WBC  3.5 (L)             05/20/2022                HGB                      7.9 (L)             05/20/2022                HCT                      24.7 (L)            05/20/2022                MCV                      101.2 (H)           05/20/2022                PLT                                          05/20/2022            PLATELET CLUMPS NOTED ON SMEAR, UNABLE TO ESTIMATE    Anesthesia Other Findings   Reproductive/Obstetrics                             Anesthesia Physical Anesthesia Plan  ASA: 4  Anesthesia Plan: General   Post-op Pain Management: Ofirmev IV (intra-op)*   Induction:  Intravenous  PONV Risk Score and Plan: 2 and Ondansetron, Dexamethasone, Midazolam and Treatment may vary due to age or medical condition  Airway Management Planned: Oral ETT  Additional Equipment: None  Intra-op Plan:   Post-operative Plan: Extubation in OR  Informed Consent: I have reviewed the patients History and Physical, chart, labs and discussed the procedure including the risks, benefits and alternatives for the proposed anesthesia with the patient or authorized representative who has indicated his/her understanding and acceptance.     Dental advisory given  Plan Discussed with: CRNA, Anesthesiologist and Surgeon  Anesthesia Plan Comments:        Anesthesia Quick Evaluation

## 2022-05-20 NOTE — Anesthesia Postprocedure Evaluation (Signed)
Anesthesia Post Note  Patient: Phillip Lynch  Procedure(s) Performed: LEFT ARM BASILIC VEIN TRANSPOSITION (Left: Arm Upper) PERITONEAL DIALYSIS CATHETER REMOVAL     Patient location during evaluation: PACU Anesthesia Type: General Level of consciousness: awake and alert Pain management: pain level controlled Vital Signs Assessment: post-procedure vital signs reviewed and stable Respiratory status: spontaneous breathing, nonlabored ventilation and respiratory function stable Cardiovascular status: stable and blood pressure returned to baseline Anesthetic complications: no   No notable events documented.  Last Vitals:  Vitals:   05/20/22 1600 05/20/22 1615  BP: 138/78 139/76  Pulse: 87 88  Resp: 12 13  Temp:  36.7 C  SpO2: 94% 93%    Last Pain:  Vitals:   05/20/22 1615  TempSrc:   PainSc: [place]

## 2022-05-20 NOTE — Anesthesia Procedure Notes (Signed)
Procedure Name: Intubation Date/Time: 05/20/2022 12:50 PM  Performed by: Alain Marion, CRNAPre-anesthesia Checklist: Patient identified, Emergency Drugs available, Suction available and Patient being monitored Patient Re-evaluated:Patient Re-evaluated prior to induction Oxygen Delivery Method: Circle System Utilized Preoxygenation: Pre-oxygenation with 100% oxygen Induction Type: IV induction Ventilation: Mask ventilation without difficulty Laryngoscope Size: Miller and 3 Grade View: Grade I Tube type: Oral Tube size: 7.5 mm Number of attempts: 1 Airway Equipment and Method: Stylet and Oral airway Placement Confirmation: ETT inserted through vocal cords under direct vision, positive ETCO2 and breath sounds checked- equal and bilateral Secured at: 22 cm Tube secured with: Tape Dental Injury: Teeth and Oropharynx as per pre-operative assessment

## 2022-05-20 NOTE — Interval H&P Note (Signed)
History and Physical Interval Note:  05/20/2022 12:29 PM  Phillip Lynch  has presented today for surgery, with the diagnosis of esrd.  The various methods of treatment have been discussed with the patient and family. After consideration of risks, benefits and other options for treatment, the patient has consented to  Procedure(s): LEFT ARTERIOVENOUS (AV) FISTULA CREATION VS GORTEX GRAFT (Left) PERITONEAL DIALYSIS CATHETER REMOVAL as a surgical intervention.  The patient's history has been reviewed, patient examined, no change in status, stable for surgery.  I have reviewed the patient's chart and labs.  Questions were answered to the patient's satisfaction.     Deitra Mayo

## 2022-05-20 NOTE — ED Notes (Signed)
Wiped down with CHG wipes (2)

## 2022-05-20 NOTE — Discharge Instructions (Addendum)
Dear Mr. Adcock,   It was a pleasure taking care of you. You were admitted to the hospital with extremely high potassium levels, which resolved with dialysis. You were also found to have low hemoglobin levels in your blood and received a blood transfusion. While your hemoglobin levels have improved, we are still concerned about a possible bleed in your gastrointestinal tract. Please follow up with gastroenterology, you have an appointment scheduled for 12-11.  Please continue to take all of your medications as instructed.   Please go to [place] tomorrow December 8th to receive hemodialysis.   Please go to your appointment with Vascular Surgery on 12//2023 at 9:00AM.  We have scheduled a hospital follow-up appointment for you with Dr. Lisabeth Devoid at our Internal Medicine Clinic on the ground floor of [facility] on 05/27/2022 at 2:45 PM. You are welcome to establish care with this clinic as your new primary doctor.      [facility]  Discharge Instructions  AV Fistula or Graft Surgery for Dialysis Access  Please refer to the following instructions for your post-procedure care. Your surgeon or physician assistant will discuss any changes with you.  Activity  You may drive the day following your surgery, if you are comfortable and no longer taking prescription pain medication. Resume full activity as the soreness in your incision resolves.  Bathing/Showering  You may shower after you go home. Keep your incision dry for 48 hours. Do not soak in a bathtub, hot tub, or swim until the incision heals completely. You may not shower if you have a hemodialysis catheter.  Incision Care  Clean your incision with mild soap and water after 48 hours. Pat the area dry with a clean towel. You do not need a bandage unless otherwise instructed. Do not apply any ointments or creams to your incision. You may have skin glue on your incision. Do  not peel it off. It will come off on its own in about one week. Your arm may swell a bit after surgery. To reduce swelling use pillows to elevate your arm so it is above your heart. Your doctor will tell you if you need to lightly wrap your arm with an ACE bandage.  Diet  Resume your normal diet. There are not special food restrictions following this procedure. In order to heal from your surgery, it is CRITICAL to get adequate nutrition. Your body requires vitamins, minerals, and protein. Vegetables are the best source of vitamins and minerals. Vegetables also provide the perfect balance of protein. Processed food has little nutritional value, so try to avoid this.  Medications  Resume taking all of your medications. If your incision is causing pain, you may take over-the counter pain relievers such as acetaminophen (Tylenol). If you were prescribed a stronger pain medication, please be aware these medications can cause nausea and constipation. Prevent nausea by taking the medication with a snack or meal. Avoid constipation by drinking plenty of fluids and eating foods with high amount of fiber, such as fruits, vegetables, and grains.  Do not take Tylenol if you are taking prescription pain medications.  Follow up Your surgeon may want to see you in the office following your access surgery. If so, this will be arranged at the time of your surgery.  Please call us immediately for any of the following conditions:  Increased pain, redness, drainage (pus) from your incision site Fever of 101 degrees or higher Severe or worsening pain at  your incision site Hand pain or numbness.  Reduce your risk of vascular disease:  Stop smoking. If you would like help, call [facility] at 1-800-QUIT-NOW 773 739 0209) or [facility] at [place] your cholesterol Maintain a desired weight Control your diabetes Keep your blood pressure down  Dialysis  It will take several weeks to several  months for your new dialysis access to be ready for use. Your surgeon will determine when it is okay to use it. Your nephrologist will continue to direct your dialysis. You can continue to use your Permcath until your new access is ready for use.   05/20/2022 Phillip Lynch [id number] [birth year]  Surgeon(s): Angelia Mould, MD  Procedure(s): LEFT ARM BASILIC VEIN TRANSPOSITION PERITONEAL DIALYSIS CATHETER REMOVAL   May stick graft immediately   May stick graft on designated area only:   X Do not stick Left BV Fistula for 12 weeks    If you have any questions, please call the office at [phone].

## 2022-05-20 NOTE — Transfer of Care (Signed)
Immediate Anesthesia Transfer of Care Note  Patient: Phillip Lynch  Procedure(s) Performed: LEFT ARM BASILIC VEIN TRANSPOSITION (Left: Arm Upper) PERITONEAL DIALYSIS CATHETER REMOVAL  Patient Location: PACU  Anesthesia Type:General  Level of Consciousness: awake, alert , and oriented  Airway & Oxygen Therapy: Patient Spontanous Breathing and Patient connected to face mask oxygen  Post-op Assessment: Report given to RN and Post -op Vital signs reviewed and stable  Post vital signs: Reviewed and stable  Last Vitals:  Vitals Value Taken Time  BP 135/85 05/20/22 1538  Temp 36.7 C 05/20/22 1538  Pulse 95 05/20/22 1544  Resp 17 05/20/22 1544  SpO2 98 % 05/20/22 1544  Vitals shown include unvalidated device data.  Last Pain:  Vitals:   05/20/22 1538  TempSrc:   PainSc: 6       Patients Stated Pain Goal: 3 (88/35/84 4652)  Complications: No notable events documented.

## 2022-05-20 NOTE — ED Notes (Signed)
Called to activate the purple man 

## 2022-05-20 NOTE — ED Notes (Signed)
Pt resting comfortably in bed, respirations are even and non-labored. NAD. Call bell is within reach.  

## 2022-05-20 NOTE — Progress Notes (Signed)
Subjective:  Patient feeling well today. No concerns. Denies headache, nausea, vomiting, shortness of breath, chest pain. Fatigue is decreasing. He had an episode of L eye pain and headache last night but it has since resolved.   Interval history: After PD catheter removed, patient not feeling well. Feeling tired and not ready to go home. Has abdominal discomfort.   Objective:  Vital signs in last 24 hours: Vitals:   05/20/22 1545 05/20/22 1600 05/20/22 1615 05/20/22 1630  BP: 137/83 138/78 139/76 (!) 123/91  Pulse: 94 87 88 85  Resp: 14 12 13 16   Temp:   98.1 F (36.7 C) 97.7 F (36.5 C)  TempSrc:    Oral  SpO2: 97% 94% 93% 100%  Weight:      Height:        Physical Exam:  Constitutional: well-appearing in no acute distress CV: Regular rate and rhythm, no murmurs, gallops Pulm: Normal work of breathing, clear to auscultation bilaterally Abdominal: normoactive bowel sounds, soft, non-tender, PD catheter site bandage clean and dry Extremities: Trace lower extremity edema bilaterally  Labs     Latest Ref Rng & Units 05/20/2022    3:33 AM 05/19/2022    1:05 AM 05/18/2022    6:35 PM  CBC  WBC 4.0 - 10.5 K/uL 3.5  5.4  5.0   Hemoglobin 13.0 - 17.0 g/dL 7.9  7.2  7.9   Hematocrit 39.0 - 52.0 % 24.7  20.1  23.5   Platelets 150 - 400 K/uL PLATELET CLUMPS NOTED ON SMEAR, UNABLE TO ESTIMATE  102  115        Latest Ref Rng & Units 05/20/2022    3:33 AM 05/19/2022    1:05 AM 05/18/2022    6:35 PM  CMP  Glucose 70 - 99 mg/dL 88  109  187   BUN 6 - 20 mg/dL 53  80  64   Creatinine 0.61 - 1.24 mg/dL 17.16  26.77  24.42   Sodium 135 - 145 mmol/L 136  135  134   Potassium 3.5 - 5.1 mmol/L 4.6  5.1  4.1   Chloride 98 - 111 mmol/L 99  94  92   CO2 22 - 32 mmol/L 21  24  23    Calcium 8.9 - 10.3 mg/dL 7.8  7.8  8.1   Total Protein 6.5 - 8.1 g/dL  6.2    Total Bilirubin 0.3 - 1.2 mg/dL  0.9    Alkaline Phos 38 - 126 U/L  43    AST 15 - 41 U/L  26    ALT 0 - 44 U/L  21       Summary Phillip Lynch is a 56 year old male 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     Assessment/Plan:  Principal Problem:   Hyperkalemia Active Problems:   HFrEF (heart failure with reduced ejection fraction) (HCC)   End stage renal disease on dialysis (HCC)   Anemia   Thrombocytopenia (HCC)   Acute hypoxic respiratory failure (HCC)   Elevated troponin  ESRD  Patient switched from HD to PD 2 weeks ago but did not tolerate PD. Was supposed to restart HD outpatient , but presented with K>7.5 and received HD in hospital x2. Patient has outpatient HD bed at [facility] M[facility]. Patient had PD catheter removed by [facility] today, was feeling poorly post-op. Will keep him overnight and make sure he is tolerating PO and feeling better prior to  discharge. Will need to receive HD here, as he is unlikely to make 10:45 appointment tomorrow.  - Next HD 12/6 - am BMP  Macrocytic Anemia Thrombocytopenia Hemoglobin 13.6 in October, was 5.9 on admission. Received 1 unit pRBC & epo. Hemoglobin 7.9 today. Reticulocyte index calculated as 0.26, indicating hypoproliferation, consistent with anemia of ESRD. LDH is elevated, but less concern for hemolysis given normal smear, reticulocyte count, bilirubin, and RDW. Haptoglobin still pending. No need to consult GI at this time - haptoglobin pending - am CBC    DIET: renal IVF: none DVT PPX: SCD BOWEL: none CODE: FULL FAM COM: none   PT/OT recs: not consulted Dispo: home Barriers to discharge: clinical improvement    LOS: 2 days   Annia Belt, Medical Student 05/20/2022, 6:03 PM

## 2022-05-20 NOTE — Discharge Summary (Incomplete)
Name: Phillip Lynch MRN: [id number] DOB: 1965/07/04 56 y.o. PCP: Patient, [name]  Date of Admission: 05/18/2022  6:52 AM Date of Discharge: 05/22/2022 Attending Physician: Charise Killian, MD  Discharge Diagnosis: 1. ESRD on HD 2. HFrEF 3. HTN 4. Anemia of CKD  Discharge Medications: Allergies as of 05/22/2022       Reactions   Nsaids Other (See Comments)   Patient was born with only 1 kidney and was told to not take NSAID(s)        Medication List     STOP taking these medications    HYDROcodone-acetaminophen 5-325 MG tablet Commonly known as: Norco       TAKE these medications    calcitRIOL 0.5 MCG capsule Commonly known as: ROCALTROL Take 0.5 mcg by mouth daily.   carvedilol 6.25 MG tablet Commonly known as: COREG Take 1 tablet (6.25 mg total) by mouth 2 (two) times daily.   Dialyvite 800 0.8 MG Tabs Take 1 tablet by mouth daily.   Sensipar 30 MG tablet Generic drug: cinacalcet Take 30 mg by mouth daily with supper.   Velphoro 500 MG chewable tablet Generic drug: sucroferric oxyhydroxide Chew 500 mg by mouth 3 (three) times daily with meals.        Disposition and follow-up:    Mr.Daemyn Hebard was discharged from [facility] in Stable condition.  At the hospital follow up visit please address:  Blood Pressure - Patient hypertensive throughout admission, remains on carvedilol 6.25mg  q24  Hemodialysis - Patient restarting outpatient HD on 12/8 at [facility]  Anemia Thrombocytopenia - Please continue to monitor patient's anemia and thrombocytopenia.  - GI referral placed, appt 05/26/22  3.  Pending labs/ test needing follow-up: none   Follow-up Appointments:  Follow-up Information     Emilie Rutter, [facility]. Go on 05/23/2022.   Why: Schedule is Monday/Wednesday/Friday with 11:35 am chair time.  On Friday, please arrive at 10:45 am to complete paperwork prior to treatment. Contact information: [place] [address] [phone]         Iona Beard, MD. Go to.   Specialty: Internal Medicine Why: Appointment at the Internal Medicine Clinic on 05/27/2022  at 2:45 PM Contact information: [address] [place]  [address] [phone]         [facility] Follow up in 6 week(s).   Why: The office will call the patient with an appointment Contact information: [place] [phone]        Sharyn Creamer, MD Follow up on 05/26/2022.   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    ESRD  Hyperkalemia  Uremia  Patient switched from HD to PD 2 weeks ago but did not tolerate PD. Was supposed to restart HD outpatient, but presented to ED with K>7.5, Cr 40, BUN 119. Received HD x2 (3L removed with first HD, 2.2L removed with 2nd HD). Patient's PD catheter was removed during admission. Patient has outpatient HD bed at [facility] MWF, per nephrology is okay to discharge and resume HD outpatient on 12/8. Patient had another episode of hyperkalemia prior to discharge, resolved with  albuterol and insulin, patient then received HD. Patient will continue to receive outpatient HD MWF and will be monitored at these sessions.  Macrocytic Anemia Thrombocytopenia Hemoglobin 13.6 in October, was 5.9 on admission. Received 1 unit pRBC with dialysis and mircera. Reticulocyte index calculated as 0.26, indicating hypoproliferation, consistent with anemia of ESRD. LDH is elevated, but less concern for hemolysis given normal smear, reticulocyte count, bilirubin, and RDW. Haptoglobin wnl. No obvious source of  bleeding, fobt negative. After transfusion, hemoglobin stable, was 7.4 on day of discharge. Given minor fall in hemoglobin post transfusion, follow up with gastroenterology has been arranged.  HTN BP was elevated during admission, though of note patient missed home carvedilol x1 and received the next dose late. Patient reports that it has remained elevated since he switched to PD and had poor tolerance of it, so likely component of hypervolemia. Advised patient to continue home carvedilol and outpatient HD and follow up with PCP to determine need for any htn regimen changes.  Chest pain (resolved) Elevated troponin Troponins trended down 143>121. Chest pain resolved. No concern for ACS.  Chronic HFrEF Nonischemic cardiomyopathy Echo from 2022 showed EF of 20-25%, in 11/2021 was 30-35%. A PCV Echo from 05/2022 showed EF 40-45%. Patient had volume overload on cxr at admission, received HD x 2. Patient is ineligible for most GDMT agents given his ESRD and congenital renal agenesis.    Discharge Vitals:   BP 124/62 (BP Location: Right Leg)   Pulse 100   Temp 97.8 F (36.6 C) (Oral)   Resp 16   Ht 5\' 8"  (1.727 m)   Wt 66.9 kg   SpO2 100%   BMI 22.43 kg/m   Subjective: Patient reports feeling good today and is eager to return home. States some mild intermittent abdominal pain, which has improved from yesterday. Fistula surgical site is clean and healing well, patient denies any associated symptoms. Discussed importance of close gastroenterology follow-up after discharge.   Physical Exam:  Constitutional: well-appearing, not in acute distress CV: regular rate and rhythm, no murmurs or gallops, tunneled catheter left chest no erythema. Pulm: Normal work of breathing, clear to auscultation bilaterally Abdominal: normoactive bowel sounds, mild tenderness to palpation and with movement, incision site non-erythematous, clean and dry Extremities: Left arm dressing in place over new AV fistula  with audible thrill     Pertinent Labs, Studies, and Procedures:     Latest Ref Rng & Units 05/22/2022    3:06 AM 05/21/2022    2:24 AM 05/20/2022    3:33 AM  CBC  WBC 4.0 - 10.5 K/uL 6.4  6.3  3.5   Hemoglobin 13.0 - 17.0 g/dL 7.4  8.2  7.9   Hematocrit 39.0 - 52.0 % 21.9  25.3  24.7   Platelets 150 - 400 K/uL 102  116  PLATELET CLUMPS NOTED ON SMEAR, UNABLE TO ESTIMATE       Latest Ref Rng & Units 05/22/2022    3:06 AM 05/21/2022    7:24 AM 05/21/2022    2:24 AM  CMP  Glucose 70 - 99 mg/dL 100   152   BUN 6 - 20 mg/dL 50   73   Creatinine 0.61 - 1.24 mg/dL 14.11   20.56   Sodium 135 - 145 mmol/L 132   137   Potassium 3.5 - 5.1 mmol/L 4.0  4.7  6.3   Chloride 98 - 111 mmol/L 92   93   CO2 22 - 32 mmol/L 25   27  Calcium 8.9 - 10.3 mg/dL 7.6   8.3    Iron/TIBC/Ferritin/ %Sat    Component Value Date/Time   IRON 200 (H) 05/18/2022 1033   TIBC 228 (L) 05/18/2022 1033   FERRITIN 1,289 (H) 05/18/2022 1033   IRONPCTSAT 88 (H) 05/18/2022 1033   Last vitamin B12 and Folate Lab Results  Component Value Date   VITAMINB12 590 05/18/2022   FOLATE 28.5 05/18/2022   Reticulocytes:  - Absolute retic count: 19.5 - Retic Ct pct: 1.1   Procalcitonin:  0.53   LDH: 193  Haptoglobin:  122   Smear: WBC, RBC, platelets - unremarkable morphology  Imaging results:   DG Chest 2 View Result Date: 05/18/2022 IMPRESSION: Asymmetric edema versus right base pneumonia.   PCV Echocardiogram 05/16/2022:  Mildly depressed LV systolic function with visual EF 40-45%. Left  ventricle cavity is mildly dilated. Mild global hypokinesis of the left  ventricle.  Mild left ventricular hypertrophy. Unable to evaluate  diastolic function due to suboptimal image acquisition.  Mild (Grade I) aortic regurgitation.  Moderate (Grade II) mitral regurgitation, posteriorly directed.  Mild tricuspid regurgitation. No evidence of pulmonary hypertension.  Mild pulmonic regurgitation.    Discharge  Instructions: Discharge Instructions     (HEART FAILURE PATIENTS) Call MD:  Anytime you have any of the following symptoms: 1) 3 pound weight gain in 24 hours or 5 pounds in 1 week 2) shortness of breath, with or without a dry hacking cough 3) swelling in the hands, feet or stomach 4) if you have to sleep on extra pillows at night in order to breathe.   Complete by: As directed    Call MD for:  difficulty breathing, headache or visual disturbances   Complete by: As directed    Call MD for:  extreme fatigue   Complete by: As directed    Call MD for:  persistant dizziness or light-headedness   Complete by: As directed    Call MD for:  persistant nausea and vomiting   Complete by: As directed    Call MD for:  redness, tenderness, or signs of infection (pain, swelling, redness, odor or green/yellow discharge around incision site)   Complete by: As directed    Call MD for:  severe uncontrolled pain   Complete by: As directed    Call MD for:  temperature >100.4   Complete by: As directed       Dear Mr. Vitelli,   It was a pleasure taking care of you. You were admitted to the hospital with extremely high potassium levels, which resolved with dialysis. You were also found to have low hemoglobin levels in your blood and received a blood transfusion. While your hemoglobin levels have improved, we are still concerned about a possible slow bleed in your gastrointestinal tract. Please follow up with gastroenterology, you have an appointment scheduled for 12-11.  Please continue to take all of your medications as instructed.   Please go to [place] tomorrow December 8th to receive hemodialysis.   Please go to your appointment with Vascular Surgery on 12//2023 at 9:00AM.  We have scheduled a hospital follow-up appointment for you with Dr. Lisabeth Devoid at our Internal Medicine Clinic on the ground floor of [facility] on 05/27/2022 at 2:45 PM. You are welcome to establish  care with this clinic as your new primary doctor.   Finally, please return to the emergency department if you develop any overt signs of bleeding such as red or black stool.  Take  care,  Signed: Annia Belt, MS3    Attestation for Student Documentation:  I personally was present and performed or re-performed the history, physical exam and medical decision-making activities of this service and have verified that the service and findings are accurately documented in the student's note.  Serita Butcher, MD 05/22/2022, 3:10 PM

## 2022-05-20 NOTE — Progress Notes (Signed)
Noted oozing from left upper arm incision, applied gauze and tape. Lower area of left upper arm bruised and noted to be firm in an area. Contacted Dr. Scot Dock and he came to see patient and assess site. Dr. Scot Dock wrapped left upper arm with kling gauze and ace wrap and instructed to keep arm elevated tonight. Report to Rayford Halsted RN.

## 2022-05-20 NOTE — Progress Notes (Signed)
Have tried to see patient today - has been in OR holding and OR most of the day.  Dialysis orders placed for tomorrow (12/6) in case I am unable to see him for rounds today.  Veneta Penton, PA-C [employer] Pager [phone]

## 2022-05-20 NOTE — Op Note (Signed)
NAME: Phillip Lynch    MRN: [id number] DOB: 1966-06-11    DATE OF OPERATION: 05/20/2022  PREOP DIAGNOSIS:    END-STAGE RENAL DISEASE  POSTOP DIAGNOSIS:    SAME  PROCEDURE:    Removal of peritoneal dialysis catheter Left basilic vein transposition  SURGEON: Judeth Cornfield. Scot Dock, MD  ASSIST: Karoline Caldwell, PA  ANESTHESIA: General  EBL: Minimal  INDICATIONS:    Phillip Lynch is a 56 y.o. male who has a functioning dialysis catheter.  He has a peritoneal dialysis catheter and no longer wished to do peritoneal dialysis.  He presents for removal of the catheter.  He has a nonfunctioning fistula in the left arm and while he was under anesthesia it seemed reasonable to proceed with placement of new access in the left arm.  FINDINGS:   The basilic vein looked reasonable in size by SonoSite and therefore I proceeded with a left basilic vein transposition.  TECHNIQUE:   Patient was taken to the operating room and received a general anesthetic.  The abdomen and left arm were prepped and draped in usual sterile fashion.  I could palpate the 2 cuffs in the left upper quadrant an incision was made between these 2 areas.  I was able to dissected out the catheter and then sharply dissected the cuff from the subcutaneous tissue.  Both cuffs were removed in their entirety.  Catheter was divided and then at the exit site the catheter was removed.  I then followed the catheter down to the additional cuff and the fascia and incision was made over this area.  This was a transverse incision and dissection was carried down to the calf and the anterior rectus sheath.  The entire cuff was removed.  The fascial defect in the anterior rectus sheath was closed with running Vicryl suture.  Each of these incisions was closed with a deep layer of 3-0 Vicryl and the skin closed with 4-0 Monocryl after hemostasis was obtained.  Dermabond was applied.  Attention was then turned to the left arm.  I looked at  the basilic vein myself with the SonoSite and it looked reasonable in size.  Using 3 incisions along the medial aspect of the left arm the basilic vein was harvested from the antecubital level up to the axilla.  Through the distal incision the brachial artery was dissected free beneath the fascia.  A tunnel was created from this incision to the axillary incision.  The vein was ligated distally and then brought through the tunnel being careful to prevent twisting.  The patient was heparinized.  The brachial artery was clamped proximally and distally and a longitudinal arteriotomy was made.  The vein was spatulated and sewn into side to the artery using continuous 6-0 Prolene suture.  At the completion there was a good thrill in the fistula.  There was a radial and ulnar signal with the Doppler.  Hemostasis was obtained in these wounds.  These wounds were each closed the deep layer 3-0 Vicryl and the skin closed with 4-0 Monocryl.  Dermabond was applied.  The patient tolerated procedure well was transferred recovery in stable condition.  All needle and sponge counts were correct.  Given the complexity of the case a first assistant was necessary in order to expedient the procedure and safely perform the technical aspects of the operation.  The assistant was necessary for retraction of the vein as it was dissected from the antecubital level to the axilla.  They facilitated dividing the branches and  were critical in the anastomosis.   Deitra Mayo, MD, FACS [facility]  DATE OF DICTATION:   05/20/2022

## 2022-05-20 NOTE — ED Notes (Signed)
Transferred to SS36 on monitor with RN. Placed on monitor. VSS. Pt remains alert, NAD, calm, interactive. No changes.

## 2022-05-21 ENCOUNTER — Telehealth: Payer: Self-pay

## 2022-05-21 ENCOUNTER — Encounter (HOSPITAL_COMMUNITY): Payer: Self-pay | Admitting: Vascular Surgery

## 2022-05-21 LAB — POTASSIUM: Potassium: 4.7 mmol/L (ref 3.5–5.1)

## 2022-05-21 LAB — CBC
HCT: 25.3 % — ABNORMAL LOW (ref 39.0–52.0)
Hemoglobin: 8.2 g/dL — ABNORMAL LOW (ref 13.0–17.0)
MCH: 32.5 pg (ref 26.0–34.0)
MCHC: 32.4 g/dL (ref 30.0–36.0)
MCV: 100.4 fL — ABNORMAL HIGH (ref 80.0–100.0)
Platelets: 116 10*3/uL — ABNORMAL LOW (ref 150–400)
RBC: 2.52 MIL/uL — ABNORMAL LOW (ref 4.22–5.81)
RDW: 15 % (ref 11.5–15.5)
WBC: 6.3 10*3/uL (ref 4.0–10.5)
nRBC: 0 % (ref 0.0–0.2)

## 2022-05-21 LAB — RENAL FUNCTION PANEL
Albumin: 3.3 g/dL — ABNORMAL LOW (ref 3.5–5.0)
Anion gap: 17 — ABNORMAL HIGH (ref 5–15)
BUN: 73 mg/dL — ABNORMAL HIGH (ref 6–20)
CO2: 27 mmol/L (ref 22–32)
Calcium: 8.3 mg/dL — ABNORMAL LOW (ref 8.9–10.3)
Chloride: 93 mmol/L — ABNORMAL LOW (ref 98–111)
Creatinine, Ser: 20.56 mg/dL — ABNORMAL HIGH (ref 0.61–1.24)
GFR, Estimated: 2 mL/min — ABNORMAL LOW (ref >60)
Glucose, Bld: 152 mg/dL — ABNORMAL HIGH (ref 70–99)
Phosphorus: 7.1 mg/dL — ABNORMAL HIGH (ref 2.5–4.6)
Potassium: 6.3 mmol/L (ref 3.5–5.1)
Sodium: 137 mmol/L (ref 135–145)

## 2022-05-21 LAB — GLUCOSE, CAPILLARY: Glucose-Capillary: 123 mg/dL — ABNORMAL HIGH (ref 70–99)

## 2022-05-21 LAB — HAPTOGLOBIN: Haptoglobin: 122 mg/dL (ref 29–370)

## 2022-05-21 MED ORDER — DEXTROSE 50 % IV SOLN
1.0000 | Freq: Once | INTRAVENOUS | Status: AC
  Administered 2022-05-21: 50 mL via INTRAVENOUS
  Filled 2022-05-21: qty 50

## 2022-05-21 MED ORDER — HYDROMORPHONE HCL 2 MG PO TABS
3.0000 mg | ORAL_TABLET | ORAL | Status: DC | PRN
  Administered 2022-05-22: 3 mg via ORAL
  Filled 2022-05-21: qty 2

## 2022-05-21 MED ORDER — ACETAMINOPHEN 650 MG RE SUPP: 650.0000 mg | Freq: Four times a day (QID) | RECTAL | Status: DC

## 2022-05-21 MED ORDER — HYDROMORPHONE HCL 2 MG PO TABS: 2.0000 mg | ORAL_TABLET | ORAL | Status: DC | PRN

## 2022-05-21 MED ORDER — CALCIUM GLUCONATE-NACL 1-0.675 GM/50ML-% IV SOLN
1.0000 g | Freq: Once | INTRAVENOUS | Status: AC
  Administered 2022-05-21: 1000 mg via INTRAVENOUS
  Filled 2022-05-21: qty 50

## 2022-05-21 MED ORDER — ACETAMINOPHEN 325 MG PO TABS
650.0000 mg | ORAL_TABLET | Freq: Four times a day (QID) | ORAL | Status: DC
  Administered 2022-05-21 – 2022-05-22 (×4): 650 mg via ORAL
  Filled 2022-05-21 (×4): qty 2

## 2022-05-21 MED ORDER — SODIUM ZIRCONIUM CYCLOSILICATE 10 G PO PACK
10.0000 g | PACK | Freq: Once | ORAL | Status: AC
  Administered 2022-05-21: 10 g via ORAL
  Filled 2022-05-21: qty 1

## 2022-05-21 MED ORDER — INSULIN ASPART 100 UNIT/ML IV SOLN
5.0000 [IU] | Freq: Once | INTRAVENOUS | Status: AC
  Administered 2022-05-21: 5 [IU] via INTRAVENOUS

## 2022-05-21 MED ORDER — HYDROMORPHONE HCL 2 MG PO TABS
2.0000 mg | ORAL_TABLET | ORAL | Status: DC | PRN
  Administered 2022-05-21: 2 mg via ORAL
  Filled 2022-05-21: qty 1

## 2022-05-21 MED ORDER — HEPARIN SODIUM (PORCINE) 1000 UNIT/ML IJ SOLN
INTRAMUSCULAR | Status: AC
  Filled 2022-05-21: qty 4

## 2022-05-21 MED ORDER — ALBUTEROL SULFATE (2.5 MG/3ML) 0.083% IN NEBU
10.0000 mg | INHALATION_SOLUTION | Freq: Once | RESPIRATORY_TRACT | Status: AC
  Administered 2022-05-21: 10 mg via RESPIRATORY_TRACT
  Filled 2022-05-21: qty 12

## 2022-05-21 NOTE — Progress Notes (Signed)
Received patient in bed to unit.  Alert and oriented.  Informed consent signed and in chart.   Treatment initiated: 0916 Treatment completed: 1240  Patient tolerated well.  Transported back to the room  Alert, without acute distress.  Hand-off given to patient's nurse.   Access used: Catheter Access issues: none  Total UF removed: 1.3L Medication(s) given: None Post HD VS: 161/78 Post HD weight: unable to weigh   [facility]

## 2022-05-21 NOTE — Progress Notes (Addendum)
Subjective:  Patient feeling weak and fatigued this morning prior to dialysis. He states he feels tingling in bilateral hands and feet, as well as in his face. He is having significant abdominal pain after PD catheter removal. Overnight, patient had potassium elevated to 6.3. Received albuterol and insulin, then HD.    Interval history: After dialysis, patient continues to feel weak, fatigued, and significant abdominal pain. He does not have assistance at home and is unable to sit up given his pain.   Objective:  Vital signs in last 24 hours: Vitals:   05/21/22 1146 05/21/22 1216 05/21/22 1246 05/21/22 1404  BP: 138/69 (!) 145/74 (!) 140/69 122/73  Pulse: (!) 105   (!) 110  Resp: 11 10 11 14   Temp:    98.2 F (36.8 C)  TempSrc:    Oral  SpO2:    100%  Weight:      Height:        Physical Exam:  Constitutional: well-appearing in no acute distress CV: Regular rate and rhythm, no murmurs, gallops. Tunneled catheter left chest no erythema. Pulm: Normal work of breathing, clear to auscultation bilaterally Abdominal: normoactive bowel sounds, tender to palpation and with movement, incision site non-erythematous, clean and dry Extremities: Left arm dressing in place over new AV fistula. Left hand cool, 1+ radial pulse.  Labs     Latest Ref Rng & Units 05/21/2022    2:24 AM 05/20/2022    3:33 AM 05/19/2022    1:05 AM  CBC  WBC 4.0 - 10.5 K/uL 6.3  3.5  5.4   Hemoglobin 13.0 - 17.0 g/dL 8.2  7.9  7.2   Hematocrit 39.0 - 52.0 % 25.3  24.7  20.1   Platelets 150 - 400 K/uL 116  PLATELET CLUMPS NOTED ON SMEAR, UNABLE TO ESTIMATE  102        Latest Ref Rng & Units 05/21/2022    7:24 AM 05/21/2022    2:24 AM 05/20/2022    3:33 AM  CMP  Glucose 70 - 99 mg/dL  152  88   BUN 6 - 20 mg/dL  73  53   Creatinine 0.61 - 1.24 mg/dL  20.56  17.16   Sodium 135 - 145 mmol/L  137  136   Potassium 3.5 - 5.1 mmol/L 4.7  6.3  4.6   Chloride 98 - 111 mmol/L  93  99   CO2 22 - 32 mmol/L  27  21    Calcium 8.9 - 10.3 mg/dL  8.3  7.8    Summary Phillip Lynch is a 56 year old male 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     Assessment/Plan:  Principal Problem:   Hyperkalemia Active Problems:   HFrEF (heart failure with reduced ejection fraction) (HCC)   End stage renal disease on dialysis (HCC)   Anemia   Thrombocytopenia (HCC)   Acute hypoxic respiratory failure (HCC)   Elevated troponin   ESRD  Patient received HD today, 1.3L of UF. Is feeling poorly afterwards. He is having significant pain from his new AV fistula on L arm. He is also having significant pain at the site of where his PD catheter was removed to the point where he is unable to sit up. Does not feel as though he would be able to care for himself at home with current level of pain and incapacity. Next HD session will be outpatient on 12/8. Today was supposed to be  his first session at outpatient bed [facility] MWF), need to make sure that he is able to do new patient paperwork when he goes on Friday.  - HD on 12/8 - HM for pain, please try to avoid IV pain medications if possible  - remove ace bandage from left arm tomorrow per [facility]VS   Macrocytic Anemia Thrombocytopenia Hemoglobin stable s/p 1u PRBCs. Unclear exact etiology. Haptoglobin returned normal, not consistent with hemolysis. No obvious signs of bleeding. Neg FOBT on admission. Possibly slow GI bleed in addition to anemia of ESRD. -Will continue to monitor hemoglobin while inpatient -May need GI consult outpatient setting     DIET: renal IVF: none DVT PPX: heparin BOWEL: none CODE: full FAM COM: none   PT/OT recs: not consulted Dispo: home Barriers to discharge: clinical improvement    LOS: 3 days   Annia Belt, Medical Student 05/21/2022, 2:32 PM   Attestation for Student Documentation:  I personally was present and  performed or re-performed the history, physical exam and medical decision-making activities of this service and have verified that the service and findings are accurately documented in the student's note.  Rick Duff, MD 05/21/2022, 3:44 PM

## 2022-05-21 NOTE — Progress Notes (Signed)
Spoke with patient about test results, he had no further questions. He agreed to schedule an appointment in January, so we also got that scheduled.

## 2022-05-21 NOTE — Progress Notes (Signed)
Rt to pt's room to give Neb tx ordered, pt unavailable at this time

## 2022-05-21 NOTE — Progress Notes (Signed)
   VASCULAR SURGERY ASSESSMENT & PLAN:   POD 1 REMOVAL PERITONEAL DIALYSIS CATHETER/LEFT BASILIC VEIN TRANSPOSITION: His abdominal incisions look fine.  He has a good thrill in his left upper arm fistula and a biphasic ulnar signal with the Doppler and a brisk monophasic radial signal with the Doppler.  He has moderate swelling in the arm and will continue with some mild compression for another 24 to 48 hours.  I have written for dressing changes.   SUBJECTIVE:   Pain adequately controlled left arm  PHYSICAL EXAM:   Vitals:   05/20/22 1630 05/20/22 1900 05/20/22 2045 05/21/22 0537  BP: (!) 123/91 124/82 (!) 140/76 (!) 160/85  Pulse: 85  100 84  Resp: 16     Temp: 97.7 F (36.5 C)  98.3 F (36.8 C) 97.8 F (36.6 C)  TempSrc: Oral  Oral Oral  SpO2: 100%  96% 97%  Weight:      Height:       Good thrill in left upper arm fistula. His incisions look fine. He has some moderate swelling in the arm. He has a biphasic ulnar signal with the Doppler on the left and a monophasic radial signal which is brisk.  LABS:   Lab Results  Component Value Date   WBC 6.3 05/21/2022   HGB 8.2 (L) 05/21/2022   HCT 25.3 (L) 05/21/2022   MCV 100.4 (H) 05/21/2022   PLT 116 (L) 05/21/2022   Lab Results  Component Value Date   CREATININE 20.56 (H) 05/21/2022   Lab Results  Component Value Date   INR 1.2 05/18/2022   CBG (last 3)  Recent Labs    05/18/22 1111 05/19/22 0820 05/20/22 0741  GLUCAP 145* 129* 92    PROBLEM LIST:    Principal Problem:   Hyperkalemia Active Problems:   HFrEF (heart failure with reduced ejection fraction) (HCC)   End stage renal disease on dialysis (HCC)   Anemia   Thrombocytopenia (HCC)   Acute hypoxic respiratory failure (HCC)   Elevated troponin   CURRENT MEDS:    sodium chloride   Intravenous Once   carvedilol  6.25 mg Oral BID   Chlorhexidine Gluconate Cloth  6 each Topical Q0600   cinacalcet  30 mg Oral Q breakfast   darbepoetin (ARANESP)  injection - NON-DIALYSIS  60 mcg Subcutaneous Q Mon-1800   lanthanum  500 mg Oral TID WC    Deitra Mayo Office: [phone] 05/21/2022

## 2022-05-21 NOTE — Telephone Encounter (Signed)
-----   Message from [place], [place] sent at 05/21/2022 10:25 AM EST ----- He is supposed to have a follow-up appointment in January 2024.   If patient agreeable please have this scheduled.  Sunit [place], DO, [facility]

## 2022-05-21 NOTE — Telephone Encounter (Deleted)
-----   Message from [place], [place] sent at 05/21/2022 10:25 AM EST ----- He is supposed to have a follow-up appointment in January 2024.   If patient agreeable please have this scheduled.  Sunit [place], DO, [facility]

## 2022-05-21 NOTE — TOC Initial Note (Signed)
Transition of Care [facility]) - Initial/Assessment Note    Patient Details  Name: Phillip Lynch MRN: [id number] Date of Birth: Feb 07, 1966  Transition of Care [facility]) CM/SW Contact:    Milas Gain, [place] Phone Number: 05/21/2022, 2:55 PM  Clinical Narrative:                   CSW received consult for food ,shelter,and transportation resources for patient. CSW spoke with patient at bedside. Patient reports he comes from home alone.CSW offered patient resources for food,shelter,and transportation. Patient accepted food resources. Patient politely declined shelter resources. Patient reports he currently lives in apartment. Patient accepted transportation resources. Patient reports he has transportation to HD through his [facility] insurance. Patient reports his plan is to return home at dc. Patient reports he will need transportation assistance at dc. All questions answered. No further questions reported at this time.     Patient Goals and CMS Choice        Expected Discharge Plan and Services                                                Prior Living Arrangements/Services                       Activities of Daily Living      Permission Sought/Granted                  Emotional Assessment              Admission diagnosis:  Hyperkalemia [E87.5] End stage renal disease on dialysis ([place]) [N18.6, Z99.2] Anemia, unspecified type [D64.9] Patient Active Problem List   Diagnosis Date Noted   Hyperkalemia 05/18/2022   Anemia 05/18/2022   Thrombocytopenia ([place]) 05/18/2022   Acute hypoxic respiratory failure ([place]) 05/18/2022   Elevated troponin 05/18/2022   CKD (chronic kidney disease) stage 5, GFR less than 15 ml/min (HCC) 05/22/2021   Anemia of chronic kidney failure, stage 5 ([place]) 05/22/2021   Health care maintenance 05/22/2021   Protein-calorie malnutrition, severe 05/10/2021   Unintentional weight loss 05/07/2021   HFrEF (heart failure with reduced  ejection fraction) ([place])    End stage renal disease on dialysis ([place])    Chronic systolic heart failure ([place])    Polysubstance use disorder    Solitary kidney 07/22/2019   Uncontrolled hypertension 07/22/2019   PCP:  Patient, [name] Pharmacy:   [place] ([place] - [address] [place] [address] Phone: [phone] Fax: [phone]     Social Determinants of Health (SDOH) Interventions Food Insecurity Interventions: Other (Comment) (food resources offered patient accepted) Housing Interventions: Other (Comment) (Patient politely declined resources. Not needed at this time.) Transportation Interventions: Other (Comment) (CSW provided transportation resources. Patient accepted.) Financial Strain Interventions:  (CSW provided patient with food resources. Patient accepted.)  Readmission Risk Interventions     No data to display

## 2022-05-21 NOTE — Progress Notes (Signed)
[facility] Progress Note   Subjective:   Seen on HD - 2L UFG and tolerating. Denies CP/dyspnea. Having arm pain s/p new LUE AVF yesterday. Also had PD cath removed - no pain there.  Objective Vitals:   05/21/22 0908 05/21/22 0916 05/21/22 0946 05/21/22 1016  BP:  134/62 (!) 150/71 (!) 146/73  Pulse:   (!) 104 99  Resp:  10 15 12   Temp:      TempSrc:      SpO2: 100%     Weight:      Height:       Physical Exam General: Well appearing man, NAD. Room air. Heart: Tachycardic, no murmur Lungs: CTA anteriorly Abdomen: soft, small/dry bandage to L abdomen Extremities: No LE edema; LUE bandaged with ACE wrap Dialysis Access:  TDC in R chest, new LUE AVF + bruit (through the wrap)  Additional Objective Labs: Basic Metabolic Panel: Recent Labs  Lab 05/18/22 1029 05/18/22 1835 05/19/22 0105 05/20/22 0333 05/21/22 0224 05/21/22 0724  NA  --    < > 135 136 137  --   K  --    < > 5.1 4.6 6.3* 4.7  CL  --    < > 94* 99 93*  --   CO2  --    < > 24 21* 27  --   GLUCOSE  --    < > 109* 88 152*  --   BUN  --    < > 80* 53* 73*  --   CREATININE  --    < > 26.77* 17.16* 20.56*  --   CALCIUM  --    < > 7.8* 7.8* 8.3*  --   PHOS 6.2*  --   --   --  7.1*  --    < > = values in this interval not displayed.   Liver Function Tests: Recent Labs  Lab 05/18/22 1029 05/19/22 0105 05/21/22 0224  AST  --  26  --   ALT  --  21  --   ALKPHOS  --  43  --   BILITOT  --  0.9  --   PROT  --  6.2*  --   ALBUMIN 3.4* 3.1* 3.3*   CBC: Recent Labs  Lab 05/18/22 0744 05/18/22 0918 05/18/22 1835 05/19/22 0105 05/20/22 0333 05/21/22 0224  WBC 7.0 7.4 5.0 5.4 3.5* 6.3  NEUTROABS 5.8 6.9  --   --   --   --   HGB 5.9* 6.2* 7.9* 7.2* 7.9* 8.2*  HCT 17.6* 18.8* 23.5* 20.1* 24.7* 25.3*  MCV 102.3* 102.7* 97.5 94.8 101.2* 100.4*  PLT 99* 96* 115* 102* PLATELET CLUMPS NOTED ON SMEAR, UNABLE TO ESTIMATE 116*   Medications:  calcium gluconate      sodium chloride    Intravenous Once   carvedilol  6.25 mg Oral BID   Chlorhexidine Gluconate Cloth  6 each Topical Q0600   cinacalcet  30 mg Oral Q breakfast   darbepoetin (ARANESP) injection - NON-DIALYSIS  60 mcg Subcutaneous Q Mon-1800   lanthanum  500 mg Oral TID WC    Dialysis Orders: Previously on CCPD -> failed -> returning to HD on discharge.  Has spot at [facility] - MWF 11:35pm 4hr, 400/800, EDW 66kg?, 2K/2Ca, TDC, no heparin.   Assessment/Plan: Hyperkalemia: On admit - s/p temporizing measures and then HD. ^ to 6.3 overnight - HD now to correct. Uremia: Cr 40 on admit, had recently started PD but disliked it and hadn't  been doing it correctly. Changing back to HD here. ESRD: HD 12/3, 12/4 -> spoke to outpatient HT unit to see if needs full CLIP process but sounds like it was already arranged before this admit -> will be on MWF schedule at [facility] Can start on Friday 12/8. HD now - 2L UFG, 2K. Appreciate VVS assistance - PD catheter removed and new LUE AVF placed 12/5. Anemia of ESRD: Hgb 8.2 - Mircera 98mcg given 12/4. Secondary HPTH: CorrCa ok, Phos high - have restarted binders and PO sensipar - will titrate dose as outpatient. Dispo: Ok from renal standpoint once cleared by other teams.  Veneta Penton, PA-C 05/21/2022, 10:43 AM  [employer]

## 2022-05-22 ENCOUNTER — Other Ambulatory Visit (HOSPITAL_COMMUNITY): Payer: Self-pay

## 2022-05-22 LAB — RENAL FUNCTION PANEL
Albumin: 3 g/dL — ABNORMAL LOW (ref 3.5–5.0)
Anion gap: 15 (ref 5–15)
BUN: 50 mg/dL — ABNORMAL HIGH (ref 6–20)
CO2: 25 mmol/L (ref 22–32)
Calcium: 7.6 mg/dL — ABNORMAL LOW (ref 8.9–10.3)
Chloride: 92 mmol/L — ABNORMAL LOW (ref 98–111)
Creatinine, Ser: 14.11 mg/dL — ABNORMAL HIGH (ref 0.61–1.24)
GFR, Estimated: 4 mL/min — ABNORMAL LOW (ref >60)
Glucose, Bld: 100 mg/dL — ABNORMAL HIGH (ref 70–99)
Phosphorus: 6.9 mg/dL — ABNORMAL HIGH (ref 2.5–4.6)
Potassium: 4 mmol/L (ref 3.5–5.1)
Sodium: 132 mmol/L — ABNORMAL LOW (ref 135–145)

## 2022-05-22 LAB — CBC
HCT: 21.9 % — ABNORMAL LOW (ref 39.0–52.0)
Hemoglobin: 7.4 g/dL — ABNORMAL LOW (ref 13.0–17.0)
MCH: 33.5 pg (ref 26.0–34.0)
MCHC: 33.8 g/dL (ref 30.0–36.0)
MCV: 99.1 fL (ref 80.0–100.0)
Platelets: 102 10*3/uL — ABNORMAL LOW (ref 150–400)
RBC: 2.21 MIL/uL — ABNORMAL LOW (ref 4.22–5.81)
RDW: 14.7 % (ref 11.5–15.5)
WBC: 6.4 10*3/uL (ref 4.0–10.5)
nRBC: 0 % (ref 0.0–0.2)

## 2022-05-22 MED ORDER — CARVEDILOL 6.25 MG PO TABS
6.2500 mg | ORAL_TABLET | Freq: Two times a day (BID) | ORAL | 0 refills | Status: DC
  Filled 2022-05-22: qty 60, 30d supply, fill #0

## 2022-05-22 NOTE — Progress Notes (Signed)
Pt to d/c to home today and will start at [facility] tomorrow. Contacted clinic and spoke to [employer], [place]. Clinic aware pt will d/c today and start tomorrow. Renal PA to send orders/paperwork. Spoke to pt via phone. Pt is aware of time to arrive tomorrow to complete paperwork prior to treatment. Appt on AVS as well.   Melven Sartorius Renal Navigator [phone]

## 2022-05-22 NOTE — Care Management Important Message (Signed)
Important Message  Patient Details  Name: Darreld Hoffer MRN: [id number] Date of Birth: [birth year]   Medicare Important Message Given:  Yes     Shelda Altes 05/22/2022, 12:57 PM

## 2022-05-22 NOTE — Progress Notes (Signed)
Discharge instructions provided to patient. All medications, follow up appointments, and discharge instructions discussed. IV out. Monitor off CCMD notified. Discharging to home.   

## 2022-05-22 NOTE — Progress Notes (Signed)
[facility] Progress Note   Subjective:  Seen in room - still with L arm and abd pain, but improved from yesterday. C/o cool sensation in L hand - mild steal symptoms per vascular, plan to monitor for now. No CP/dyspnea. Anticipating d/c today.   Objective Vitals:   05/21/22 1715 05/21/22 2023 05/22/22 0614 05/22/22 0909  BP: 126/71 (!) 106/58 (!) 161/91 (!) 141/78  Pulse: (!) 106 (!) 101  100  Resp: 16 18 18 18   Temp: 97.7 F (36.5 C) 97.7 F (36.5 C) 98 F (36.7 C) 98.5 F (36.9 C)  TempSrc: Oral Oral Oral Oral  SpO2: 100% 97% 98% 100%  Weight:      Height:       Physical Exam General: Well appearing man, NAD. Room air. Heart: RRR; no murmur Lungs: CTA anteriorly Abdomen: soft, small/dry bandage to L abdomen Extremities: No LE edema; LUE bandaged with ACE wrap Dialysis Access:  TDC in R chest, new LUE AVF + bruit with bruising present  Additional Objective Labs: Basic Metabolic Panel: Recent Labs  Lab 05/18/22 1029 05/18/22 1835 05/20/22 0333 05/21/22 0224 05/21/22 0724 05/22/22 0306  NA  --    < > 136 137  --  132*  K  --    < > 4.6 6.3* 4.7 4.0  CL  --    < > 99 93*  --  92*  CO2  --    < > 21* 27  --  25  GLUCOSE  --    < > 88 152*  --  100*  BUN  --    < > 53* 73*  --  50*  CREATININE  --    < > 17.16* 20.56*  --  14.11*  CALCIUM  --    < > 7.8* 8.3*  --  7.6*  PHOS 6.2*  --   --  7.1*  --  6.9*   < > = values in this interval not displayed.   Liver Function Tests: Recent Labs  Lab 05/19/22 0105 05/21/22 0224 05/22/22 0306  AST 26  --   --   ALT 21  --   --   ALKPHOS 43  --   --   BILITOT 0.9  --   --   PROT 6.2*  --   --   ALBUMIN 3.1* 3.3* 3.0*   CBC: Recent Labs  Lab 05/18/22 0744 05/18/22 0918 05/18/22 1835 05/19/22 0105 05/20/22 0333 05/21/22 0224 05/22/22 0306  WBC 7.0 7.4 5.0 5.4 3.5* 6.3 6.4  NEUTROABS 5.8 6.9  --   --   --   --   --   HGB 5.9* 6.2* 7.9* 7.2* 7.9* 8.2* 7.4*  HCT 17.6* 18.8* 23.5* 20.1* 24.7*  25.3* 21.9*  MCV 102.3* 102.7* 97.5 94.8 101.2* 100.4* 99.1  PLT 99* 96* 115* 102* PLATELET CLUMPS NOTED ON SMEAR, UNABLE TO ESTIMATE 116* 102*   Medications:   sodium chloride   Intravenous Once   acetaminophen  650 mg Oral Q6H   Or   acetaminophen  650 mg Rectal Q6H   carvedilol  6.25 mg Oral BID   Chlorhexidine Gluconate Cloth  6 each Topical Q0600   cinacalcet  30 mg Oral Q breakfast   darbepoetin (ARANESP) injection - NON-DIALYSIS  60 mcg Subcutaneous Q Mon-1800   lanthanum  500 mg Oral TID WC    Dialysis Orders: Previously on CCPD -> failed -> returning to HD on discharge.  Has spot at [facility] - MWF  11:35pm 4hr, 400/800, EDW 66kg?, 2K/2Ca, TDC, no heparin.   Assessment/Plan: Hyperkalemia: On admit - s/p temporizing measures and then HD. K 6.3 yesterday - improved with HD. Uremia: Cr 40 on admit, had recently started PD but disliked it and hadn't been doing it correctly. Changing back to HD here. ESRD: HD 12/3, 12/4 -> spoke to outpatient HT unit to see if needs full CLIP process but sounds like it was already arranged before this admit -> will be on MWF schedule at [facility]. Can start on Friday 12/8. HD now - 2L UFG, 2K. Appreciate VVS assistance - PD catheter removed and new LUE AVF placed 12/5. Very mild steal syndrome symptoms - monitor closely. Anemia of ESRD: Hgb 7.4 - Mircera 59mcg given 12/4, ^ next dose as outpatient. Secondary HPTH: CorrCa ok, Phos high - have restarted binders and PO sensipar - will titrate dose as outpatient. Dispo: Ok from renal standpoint once cleared by other teams.  Veneta Penton, PA-C 05/22/2022, 10:15 AM  [employer]

## 2022-05-22 NOTE — Progress Notes (Signed)
   VASCULAR SURGERY ASSESSMENT & PLAN:   POD 2 REMOVAL PERITONEAL DIALYSIS CATHETER/LEFT BASILIC VEIN TRANSPOSITION: He has a good thrill in his fistula.  He has some mild steal symptoms and I have asked him to keep a close eye on this.  He will call if they get worse.  Otherwise he is scheduled for a follow-up duplex in our office in 6 weeks to check on the maturation of his fistula.  Anticipate discharge today.   SUBJECTIVE:   He states that the left hand feels a little cooler than the right.  PHYSICAL EXAM:   Vitals:   05/21/22 1404 05/21/22 1715 05/21/22 2023 05/22/22 0614  BP: 122/73 126/71 (!) 106/58 (!) 161/91  Pulse: (!) 110 (!) 106 (!) 101   Resp: 14 16 18 18   Temp: 98.2 F (36.8 C) 97.7 F (36.5 C) 97.7 F (36.5 C) 98 F (36.7 C)  TempSrc: Oral Oral Oral Oral  SpO2: 100% 100% 97% 98%  Weight:      Height:       Excellent thrill in his left upper arm fistula. His bruising and swelling are unchanged.  LABS:   Lab Results  Component Value Date   WBC 6.4 05/22/2022   HGB 7.4 (L) 05/22/2022   HCT 21.9 (L) 05/22/2022   MCV 99.1 05/22/2022   PLT 102 (L) 05/22/2022   Lab Results  Component Value Date   CREATININE 14.11 (H) 05/22/2022   Lab Results  Component Value Date   INR 1.2 05/18/2022   CBG (last 3)  Recent Labs    05/19/22 0820 05/20/22 0741 05/21/22 1714  GLUCAP 129* 92 123*    PROBLEM LIST:    Principal Problem:   Hyperkalemia Active Problems:   HFrEF (heart failure with reduced ejection fraction) (HCC)   End stage renal disease on dialysis (HCC)   Anemia   Thrombocytopenia (HCC)   Acute hypoxic respiratory failure (HCC)   Elevated troponin   CURRENT MEDS:    sodium chloride   Intravenous Once   acetaminophen  650 mg Oral Q6H   Or   acetaminophen  650 mg Rectal Q6H   carvedilol  6.25 mg Oral BID   Chlorhexidine Gluconate Cloth  6 each Topical Q0600   cinacalcet  30 mg Oral Q breakfast   darbepoetin (ARANESP) injection -  NON-DIALYSIS  60 mcg Subcutaneous Q Mon-1800   lanthanum  500 mg Oral TID WC    Deitra Mayo Office: [phone] 05/22/2022

## 2022-05-22 NOTE — Progress Notes (Signed)
Still endorsing moderately high levels of sore, throbbing, aching pain in abdomen and left arm. Utilized PRN dilaudid along with the scheduled tylenol. Was able to stand and ambulate independently in room; however, heart rate jumped up to 140s. BP a little elevated in 742V systolic. Hopeful for discharge today.

## 2022-05-22 NOTE — TOC Transition Note (Signed)
Transition of Care [facility]) - CM/SW Discharge Note   Patient Details  Name: Phillip Lynch MRN: [id number] Date of Birth: 07/20/1965  [facility] Care [facility]) CM/SW Contact:  Milas Gain, [place] Phone Number: 05/22/2022, 4:15 PM   Clinical Narrative:     Patient will DC to: [address] [place] [address]   Anticipated DC date: 05/22/2022  Family notified: Patient declined   Transport by: [employer]-Patient signed rider waiver. Original placed in patients hard chart.  ?  CSW signing off.   Final next level of care: Home/Self Care Barriers to Discharge: No Barriers Identified   Patient Goals and CMS Choice Patient states their goals for this hospitalization and ongoing recovery are:: to return home      Discharge Placement                Patient to be transferred to facility by: [facility]is [facility] Name of family member notified: Patient declined Patient and family notified of of transfer: 05/22/22  Discharge Plan and Services                                     Social Determinants of Health (SDOH) Interventions Food Insecurity Interventions: Other (Comment) (food resources offered patient accepted) Housing Interventions: Other (Comment) (Patient politely declined resources. Not needed at this time.) Transportation Interventions: Other (Comment) (CSW provided transportation resources. Patient accepted.) Financial Strain Interventions:  (CSW provided patient with food resources. Patient accepted.)   Readmission Risk Interventions    05/22/2022   12:42 PM  Readmission Risk Prevention Plan  Transportation Screening Complete  HRI or Home Care Consult Complete  Social Work Consult for [place] Planning/Counseling Complete  Palliative Care Screening Not Applicable  Medication Review Press [employer]) Referral to Pharmacy

## 2022-05-23 ENCOUNTER — Ambulatory Visit: Payer: Medicare Other | Admitting: Vascular Surgery

## 2022-05-23 ENCOUNTER — Ambulatory Visit (HOSPITAL_COMMUNITY): Payer: Medicare Other

## 2022-05-24 ENCOUNTER — Telehealth (HOSPITAL_COMMUNITY): Payer: Self-pay | Admitting: Nephrology

## 2022-05-24 NOTE — Telephone Encounter (Signed)
Transition of care contact from inpatient facility  Date of discharge: 05/22/22 Date of contact: 05/24/22 Method: Phone Spoke to: Patient  Patient contacted to discuss transition of care from recent inpatient hospitalization. Patient was admitted to [facility] from 12/3-12/7/23 with discharge diagnosis of hyperkalemia/uremia, changing modality from PD -> HD.  Medication changes were reviewed. He has everything.  LUE AVF incision with very mild clear draining - we discussed okay to wrap with loose gauze -- not compression. He understands.  No other concerns at this time.  Veneta Penton, PA-C [employer] Pager [phone]

## 2022-05-25 LAB — AEROBIC/ANAEROBIC CULTURE W GRAM STAIN (SURGICAL/DEEP WOUND)
Culture: NO GROWTH
Gram Stain: NONE SEEN

## 2022-05-26 ENCOUNTER — Ambulatory Visit: Payer: Medicare Other | Admitting: Internal Medicine

## 2022-05-27 ENCOUNTER — Encounter: Payer: Medicare Other | Admitting: Student

## 2022-06-02 ENCOUNTER — Ambulatory Visit: Payer: Medicare Other | Admitting: Student

## 2022-06-20 ENCOUNTER — Other Ambulatory Visit: Payer: Self-pay | Admitting: *Deleted

## 2022-06-20 DIAGNOSIS — N186 End stage renal disease: Secondary | ICD-10-CM

## 2022-07-01 ENCOUNTER — Ambulatory Visit: Payer: 59 | Admitting: Cardiology

## 2022-07-03 ENCOUNTER — Telehealth: Payer: Self-pay | Admitting: Internal Medicine

## 2022-07-03 ENCOUNTER — Ambulatory Visit: Payer: Medicare Other | Admitting: Internal Medicine

## 2022-07-03 NOTE — Telephone Encounter (Signed)
Good morning Dr. Lorenso Courier,   Patient called stating that he would not be able to make it to his appointment with you this morning at 9:10 due to having car trouble.   Patient was rescheduled for 2/22 at 10:30

## 2022-07-04 ENCOUNTER — Ambulatory Visit (HOSPITAL_COMMUNITY): Payer: 59

## 2022-07-06 NOTE — Progress Notes (Signed)
Patient cancelled.   Rex Kras, [place], [facility]  Pager: [phone] Office: [phone]

## 2022-07-10 NOTE — Progress Notes (Signed)
POST OPERATIVE OFFICE NOTE    CC:  F/u for surgery  HPI:  This is a 57 y.o. male who has ESRD.  He was on PD, but now he has [facility] and has undergone transposition basilic Fistula creation.  DR. Scot Dock also removed the PD catheter surgey surgery at the patients request.  He no longer plans to do PD.  He is here for a post op follow.    Pt returns today for follow up.  Pt states he no motor or sensation loss in the left UE.  He gets an occasional sharp pain near the incision, but no steal symptoms.  He has a working right [facility] that was placed by [facility].  He has well healed abdominal incision post PD removal.   Allergies  Allergen Reactions   Nsaids Other (See Comments)    Patient was born with only 1 kidney and was told to not take NSAID(s)    Current Outpatient Medications  Medication Sig Dispense Refill   B Complex-C-Folic Acid (DIALYVITE 093) 0.8 MG TABS Take 1 tablet by mouth daily.     calcitRIOL (ROCALTROL) 0.5 MCG capsule Take 0.5 mcg by mouth daily.     carvedilol (COREG) 6.25 MG tablet Take 1 tablet (6.25 mg total) by mouth 2 (two) times daily. 60 tablet 0   SENSIPAR 30 MG tablet Take 30 mg by mouth daily with supper.     sucroferric oxyhydroxide (VELPHORO) 500 MG chewable tablet Chew 500 mg by mouth 3 (three) times daily with meals.     No current facility-administered medications for this visit.     ROS:  See HPI  Physical Exam:  Findings:  +--------------------+----------+-----------------+--------+  AVF                PSV (cm/s)Flow Vol (mL/min)Comments  +--------------------+----------+-----------------+--------+  Native artery inflow   213          2902                 +--------------------+----------+-----------------+--------+  AVF Anastomosis        446                               +--------------------+----------+-----------------+--------+     +------------+----------+-------------+----------+------------+  OUTFLOW VEINPSV  (cm/s)Diameter (cm)Depth (cm)  Describe    +------------+----------+-------------+----------+------------+  Shoulder      495        0.55        0.57                 +------------+----------+-------------+----------+------------+  Prox UA        324        0.51        0.15                 +------------+----------+-------------+----------+------------+  Mid UA         237        0.59        0.12                 +------------+----------+-------------+----------+------------+  Dist UA        235        0.74        0.25                 +------------+----------+-------------+----------+------------+  AC Fossa       504        0.84  0.44   slight curve  +------------+----------+-------------+----------+------------+        Summary:  Patent arteriovenous fistula.    Incision:  well healed Extremities:  palpable thrill in fistula, as well as radial pulse Neuro: sensation intact equal B UE     Assessment/Plan:  This is a 57 y.o. male who is s/p:removal of PD cath and basilic transposition fistula creation.  The fistula is very visible and has a good thrill.  The abdomin is well healed and he denise symptoms of steal.    The fistula will be accessible on August 19 2022.  Once working well then [facility] may remove the [place].  F/U PRN.      [name] PA-C [facility] [phone]   Clinic MD:  Virl Cagey

## 2022-07-11 ENCOUNTER — Ambulatory Visit (HOSPITAL_COMMUNITY)
Admission: RE | Admit: 2022-07-11 | Discharge: 2022-07-11 | Disposition: A | Discharge status: Home / Self Care | Payer: 59 | Source: Ambulatory Visit | Attending: Vascular Surgery | Admitting: Vascular Surgery

## 2022-07-11 ENCOUNTER — Ambulatory Visit (INDEPENDENT_AMBULATORY_CARE_PROVIDER_SITE_OTHER): Payer: 59 | Admitting: Physician Assistant

## 2022-07-11 VITALS — BP 163/104 | HR 89 | Temp 97.2°F | Resp 16 | Ht 69.0 in | Wt 148.4 lb

## 2022-07-11 DIAGNOSIS — Z992 Dependence on renal dialysis: Secondary | ICD-10-CM

## 2022-07-11 DIAGNOSIS — N186 End stage renal disease: Secondary | ICD-10-CM | POA: Diagnosis present

## 2022-07-15 ENCOUNTER — Ambulatory Visit: Payer: 59 | Admitting: Internal Medicine

## 2022-07-24 ENCOUNTER — Ambulatory Visit: Payer: 59 | Admitting: Internal Medicine

## 2022-07-24 ENCOUNTER — Ambulatory Visit: Payer: 59 | Admitting: Cardiology

## 2022-08-07 ENCOUNTER — Ambulatory Visit: Payer: 59 | Admitting: Internal Medicine

## 2022-08-18 ENCOUNTER — Ambulatory Visit: Payer: 59 | Admitting: Medical

## 2022-08-24 ENCOUNTER — Encounter (HOSPITAL_COMMUNITY): Payer: Self-pay | Admitting: *Deleted

## 2022-08-24 ENCOUNTER — Other Ambulatory Visit: Payer: Self-pay

## 2022-08-24 ENCOUNTER — Emergency Department (HOSPITAL_BASED_OUTPATIENT_CLINIC_OR_DEPARTMENT_OTHER): Payer: 59

## 2022-08-24 ENCOUNTER — Inpatient Hospital Stay (HOSPITAL_COMMUNITY)
Admission: EM | Admit: 2022-08-24 | Discharge: 2022-08-27 | DRG: 252 | Disposition: A | Discharge status: Home / Self Care | Payer: 59 | Attending: Family Medicine | Admitting: Family Medicine

## 2022-08-24 DIAGNOSIS — Z841 Family history of disorders of kidney and ureter: Secondary | ICD-10-CM

## 2022-08-24 DIAGNOSIS — D696 Thrombocytopenia, unspecified: Secondary | ICD-10-CM | POA: Diagnosis present

## 2022-08-24 DIAGNOSIS — Z888 Allergy status to other drugs, medicaments and biological substances status: Secondary | ICD-10-CM

## 2022-08-24 DIAGNOSIS — Z59819 Housing instability, housed unspecified: Secondary | ICD-10-CM

## 2022-08-24 DIAGNOSIS — D539 Nutritional anemia, unspecified: Secondary | ICD-10-CM | POA: Diagnosis present

## 2022-08-24 DIAGNOSIS — I472 Ventricular tachycardia, unspecified: Secondary | ICD-10-CM | POA: Diagnosis not present

## 2022-08-24 DIAGNOSIS — Z5982 Transportation insecurity: Secondary | ICD-10-CM

## 2022-08-24 DIAGNOSIS — Z823 Family history of stroke: Secondary | ICD-10-CM

## 2022-08-24 DIAGNOSIS — N186 End stage renal disease: Secondary | ICD-10-CM

## 2022-08-24 DIAGNOSIS — T82898A Other specified complication of vascular prosthetic devices, implants and grafts, initial encounter: Secondary | ICD-10-CM | POA: Diagnosis present

## 2022-08-24 DIAGNOSIS — E785 Hyperlipidemia, unspecified: Secondary | ICD-10-CM | POA: Diagnosis present

## 2022-08-24 DIAGNOSIS — I729 Aneurysm of unspecified site: Secondary | ICD-10-CM | POA: Diagnosis present

## 2022-08-24 DIAGNOSIS — D62 Acute posthemorrhagic anemia: Secondary | ICD-10-CM | POA: Diagnosis not present

## 2022-08-24 DIAGNOSIS — Z79899 Other long term (current) drug therapy: Secondary | ICD-10-CM

## 2022-08-24 DIAGNOSIS — T82511A Breakdown (mechanical) of surgically created arteriovenous shunt, initial encounter: Principal | ICD-10-CM | POA: Diagnosis present

## 2022-08-24 DIAGNOSIS — I721 Aneurysm of artery of upper extremity: Secondary | ICD-10-CM | POA: Diagnosis present

## 2022-08-24 DIAGNOSIS — Z992 Dependence on renal dialysis: Secondary | ICD-10-CM

## 2022-08-24 DIAGNOSIS — Y832 Surgical operation with anastomosis, bypass or graft as the cause of abnormal reaction of the patient, or of later complication, without mention of misadventure at the time of the procedure: Secondary | ICD-10-CM | POA: Diagnosis present

## 2022-08-24 DIAGNOSIS — M7989 Other specified soft tissue disorders: Secondary | ICD-10-CM

## 2022-08-24 DIAGNOSIS — I160 Hypertensive urgency: Secondary | ICD-10-CM | POA: Diagnosis present

## 2022-08-24 DIAGNOSIS — D631 Anemia in chronic kidney disease: Secondary | ICD-10-CM | POA: Diagnosis present

## 2022-08-24 DIAGNOSIS — Z5941 Food insecurity: Secondary | ICD-10-CM

## 2022-08-24 DIAGNOSIS — R52 Pain, unspecified: Secondary | ICD-10-CM | POA: Diagnosis not present

## 2022-08-24 DIAGNOSIS — I5032 Chronic diastolic (congestive) heart failure: Secondary | ICD-10-CM | POA: Diagnosis present

## 2022-08-24 DIAGNOSIS — N185 Chronic kidney disease, stage 5: Secondary | ICD-10-CM | POA: Diagnosis present

## 2022-08-24 DIAGNOSIS — T82848A Pain from vascular prosthetic devices, implants and grafts, initial encounter: Principal | ICD-10-CM

## 2022-08-24 DIAGNOSIS — Q600 Renal agenesis, unilateral: Secondary | ICD-10-CM

## 2022-08-24 DIAGNOSIS — I428 Other cardiomyopathies: Secondary | ICD-10-CM

## 2022-08-24 DIAGNOSIS — I132 Hypertensive heart and chronic kidney disease with heart failure and with stage 5 chronic kidney disease, or end stage renal disease: Secondary | ICD-10-CM | POA: Diagnosis present

## 2022-08-24 DIAGNOSIS — R0789 Other chest pain: Secondary | ICD-10-CM | POA: Diagnosis not present

## 2022-08-24 DIAGNOSIS — T82510A Breakdown (mechanical) of surgically created arteriovenous fistula, initial encounter: Secondary | ICD-10-CM | POA: Insufficient documentation

## 2022-08-24 DIAGNOSIS — Z87891 Personal history of nicotine dependence: Secondary | ICD-10-CM

## 2022-08-24 DIAGNOSIS — Z886 Allergy status to analgesic agent status: Secondary | ICD-10-CM

## 2022-08-24 DIAGNOSIS — I5042 Chronic combined systolic (congestive) and diastolic (congestive) heart failure: Secondary | ICD-10-CM

## 2022-08-24 DIAGNOSIS — I959 Hypotension, unspecified: Secondary | ICD-10-CM | POA: Diagnosis not present

## 2022-08-24 LAB — CBC WITH DIFFERENTIAL/PLATELET
Abs Immature Granulocytes: 0.02 10*3/uL (ref 0.00–0.07)
Basophils Absolute: 0 10*3/uL (ref 0.0–0.1)
Basophils Relative: 0 %
Eosinophils Absolute: 0.1 10*3/uL (ref 0.0–0.5)
Eosinophils Relative: 1 %
HCT: 29.3 % — ABNORMAL LOW (ref 39.0–52.0)
Hemoglobin: 9.7 g/dL — ABNORMAL LOW (ref 13.0–17.0)
Immature Granulocytes: 0 %
Lymphocytes Relative: 17 %
Lymphs Abs: 1.1 10*3/uL (ref 0.7–4.0)
MCH: 33.8 pg (ref 26.0–34.0)
MCHC: 33.1 g/dL (ref 30.0–36.0)
MCV: 102.1 fL — ABNORMAL HIGH (ref 80.0–100.0)
Monocytes Absolute: 0.4 10*3/uL (ref 0.1–1.0)
Monocytes Relative: 6 %
Neutro Abs: 5 10*3/uL (ref 1.7–7.7)
Neutrophils Relative %: 76 %
Platelets: 127 10*3/uL — ABNORMAL LOW (ref 150–400)
RBC: 2.87 MIL/uL — ABNORMAL LOW (ref 4.22–5.81)
RDW: 14.2 % (ref 11.5–15.5)
WBC: 6.7 10*3/uL (ref 4.0–10.5)
nRBC: 0 % (ref 0.0–0.2)

## 2022-08-24 LAB — BASIC METABOLIC PANEL
Anion gap: 14 (ref 5–15)
BUN: 52 mg/dL — ABNORMAL HIGH (ref 6–20)
CO2: 30 mmol/L (ref 22–32)
Calcium: 9.3 mg/dL (ref 8.9–10.3)
Chloride: 98 mmol/L (ref 98–111)
Creatinine, Ser: 12.67 mg/dL — ABNORMAL HIGH (ref 0.61–1.24)
GFR, Estimated: 4 mL/min — ABNORMAL LOW (ref >60)
Glucose, Bld: 89 mg/dL (ref 70–99)
Potassium: 4.2 mmol/L (ref 3.5–5.1)
Sodium: 142 mmol/L (ref 135–145)

## 2022-08-24 LAB — HEPATITIS B SURFACE ANTIGEN: Hepatitis B Surface Ag: NONREACTIVE

## 2022-08-24 LAB — LACTIC ACID, PLASMA: Lactic Acid, Venous: 2 mmol/L (ref 0.5–1.9)

## 2022-08-24 MED ORDER — FENTANYL CITRATE PF 50 MCG/ML IJ SOSY
50.0000 ug | PREFILLED_SYRINGE | INTRAMUSCULAR | Status: DC | PRN
  Administered 2022-08-24 (×3): 100 ug via INTRAVENOUS
  Administered 2022-08-24: 50 ug via INTRAVENOUS
  Filled 2022-08-24: qty 2
  Filled 2022-08-24: qty 1
  Filled 2022-08-24 (×2): qty 2

## 2022-08-24 MED ORDER — OXYCODONE HCL 5 MG PO TABS: 5.0000 mg | ORAL_TABLET | Freq: Four times a day (QID) | ORAL | Status: DC | PRN

## 2022-08-24 MED ORDER — SUCROFERRIC OXYHYDROXIDE 500 MG PO CHEW
500.0000 mg | CHEWABLE_TABLET | Freq: Three times a day (TID) | ORAL | Status: DC
  Administered 2022-08-26 – 2022-08-27 (×2): 500 mg via ORAL
  Filled 2022-08-24 (×4): qty 1

## 2022-08-24 MED ORDER — CHLORHEXIDINE GLUCONATE CLOTH 2 % EX PADS
6.0000 | MEDICATED_PAD | Freq: Every day | CUTANEOUS | Status: DC
  Administered 2022-08-26: 6 via TOPICAL

## 2022-08-24 MED ORDER — NEPHRO-VITE 0.8 MG PO TABS: 1.0000 | ORAL_TABLET | Freq: Every day | ORAL | Status: DC

## 2022-08-24 MED ORDER — DIALYVITE 800 0.8 MG PO TABS: 1.0000 | ORAL_TABLET | Freq: Every day | ORAL | Status: DC

## 2022-08-24 MED ORDER — RENA-VITE PO TABS
1.0000 | ORAL_TABLET | Freq: Every day | ORAL | Status: DC
  Administered 2022-08-24 – 2022-08-26 (×3): 1 via ORAL
  Filled 2022-08-24 (×4): qty 1

## 2022-08-24 MED ORDER — HYDRALAZINE HCL 20 MG/ML IJ SOLN
5.0000 mg | Freq: Four times a day (QID) | INTRAMUSCULAR | Status: DC | PRN
  Filled 2022-08-24: qty 1

## 2022-08-24 MED ORDER — ONDANSETRON HCL 4 MG/2ML IJ SOLN: 4.0000 mg | Freq: Four times a day (QID) | INTRAMUSCULAR | Status: DC | PRN

## 2022-08-24 MED ORDER — ONDANSETRON HCL 4 MG PO TABS: 4.0000 mg | ORAL_TABLET | Freq: Four times a day (QID) | ORAL | Status: DC | PRN

## 2022-08-24 MED ORDER — CEFAZOLIN SODIUM-DEXTROSE 1-4 GM/50ML-% IV SOLN
1.0000 g | INTRAVENOUS | Status: DC
  Filled 2022-08-24: qty 50

## 2022-08-24 MED ORDER — HYDROMORPHONE HCL 1 MG/ML IJ SOLN
0.5000 mg | INTRAMUSCULAR | Status: DC | PRN
  Administered 2022-08-24 – 2022-08-25 (×2): 1 mg via INTRAVENOUS
  Administered 2022-08-25: 0.5 mg via INTRAVENOUS
  Filled 2022-08-24: qty 0.5
  Filled 2022-08-24 (×2): qty 1

## 2022-08-24 MED ORDER — CALCITRIOL 0.25 MCG PO CAPS
1.0000 ug | ORAL_CAPSULE | ORAL | Status: DC
  Administered 2022-08-25 – 2022-08-27 (×2): 1 ug via ORAL
  Filled 2022-08-24 (×3): qty 4

## 2022-08-24 MED ORDER — CEFAZOLIN SODIUM-DEXTROSE 2-4 GM/100ML-% IV SOLN
2.0000 g | INTRAVENOUS | Status: AC
  Administered 2022-08-25: 2 g via INTRAVENOUS

## 2022-08-24 MED ORDER — CARVEDILOL 6.25 MG PO TABS
6.2500 mg | ORAL_TABLET | Freq: Two times a day (BID) | ORAL | Status: DC
  Administered 2022-08-24 – 2022-08-27 (×7): 6.25 mg via ORAL
  Filled 2022-08-24 (×4): qty 1
  Filled 2022-08-24: qty 2
  Filled 2022-08-24 (×2): qty 1

## 2022-08-24 MED ORDER — FENTANYL CITRATE PF 50 MCG/ML IJ SOSY
25.0000 ug | PREFILLED_SYRINGE | Freq: Once | INTRAMUSCULAR | Status: AC
  Administered 2022-08-24: 25 ug via INTRAVENOUS
  Filled 2022-08-24: qty 1

## 2022-08-24 MED ORDER — DARBEPOETIN ALFA 200 MCG/0.4ML IJ SOSY
200.0000 ug | PREFILLED_SYRINGE | INTRAMUSCULAR | Status: DC
  Filled 2022-08-24: qty 0.4

## 2022-08-24 NOTE — Consult Note (Signed)
VASCULAR AND VEIN SPECIALISTS OF   ASSESSMENT / PLAN: 57 y.o. 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 This is a result of traumatic cannulation. He has tried to work with this but cannot. I think he has failed a trial of conservative management. Recommend admission to the hospital. Will plan to washout and repair the fistula tomorrow in the OR. NPO after midnight.   CHIEF COMPLAINT: left arm swelling and pain  HISTORY OF PRESENT ILLNESS: Phillip Lynch is a 57 y.o. male who presents to hospital for evaluation of swollen left arm.  The patient had a brachiobasilic fistula created for him 05/20/2022.  He was cleared to start using this about a week ago.  His first dialysis session was unremarkable.  On Wednesday, 08/20/2022, he had traumatic cannulation of his access.  Upon removing the needles he had severe pain and swelling in his arm.  He compressed the fistula as instructed by the dialysis staff.  He presented to work on Thursday and was able to work despite significant pain in his arm.  He presented dialysis on Friday where they recommended heat pack therapy.  Pain persisted and intensified over the weekend.  He was unable to work today, and so he presented to the ER for evaluation.   Past Medical History:  Diagnosis Date   CHF (congestive heart failure) (HCC)    Congenital renal agenesis, unilateral    Hypertension    Unintentional weight loss     Past Surgical History:  Procedure Laterality Date   AV FISTULA PLACEMENT Left 05/13/2021   Procedure: ARTERIOVENOUS (AV) FISTULA CREATION;  Surgeon: Broadus John, MD;  Location: [place];  Service: Vascular;  Laterality: Left;   AV FISTULA PLACEMENT Left 05/20/2022   Procedure: LEFT ARM BASILIC VEIN TRANSPOSITION;  Surgeon: Angelia Mould, MD;  Location: [place];  Service: Vascular;  Laterality: Left;   CAPD INSERTION N/A 04/10/2022   Procedure: LAPAROSCOPIC INSERTION CONTINUOUS  AMBULATORY PERITONEAL DIALYSIS  (CAPD) CATHETER WITH OMENTOPEXY;  Surgeon: Cherre Robins, MD;  Location: [facility];  Service: Vascular;  Laterality: N/A;   CAPD INSERTION  05/20/2022   Procedure: PERITONEAL DIALYSIS CATHETER REMOVAL;  Surgeon: Angelia Mould, MD;  Location: [place];  Service: Vascular;;   IR FLUORO GUIDE CV LINE RIGHT  07/15/2021   IR US GUIDE VASC ACCESS RIGHT  07/16/2021   RIGHT/LEFT HEART CATH AND CORONARY ANGIOGRAPHY N/A 02/18/2022   Procedure: RIGHT/LEFT HEART CATH AND CORONARY ANGIOGRAPHY;  Surgeon: Adrian Prows, MD;  Location: [facility];  Service: Cardiovascular;  Laterality: N/A;    Family History  Problem Relation Age of Onset   Stroke Mother    Dementia Mother    Kidney disease Mother     Social History   Socioeconomic History   Marital status: Divorced    Spouse name: Not on file   Number of children: 1   Years of education: Not on file   Highest education level: Not on file  Occupational History   Occupation: Landscaper  Tobacco Use   Smoking status: Former    Types: Cigars    Quit date: 04/2021    Years since quitting: 1.3   Smokeless tobacco: Never  Vaping Use   Vaping Use: Never used  Substance and Sexual Activity   Alcohol use: Yes    Comment: occ   Drug use: Not Currently    Types: Marijuana   Sexual activity: Yes  Other Topics Concern   Not on file  Social History Narrative   Works as a Development worker, 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 His father passed in March and his mother moved into a nursing facility, and unfortunately he lost ownership of his family's house so has been experiencing housing instability since early summer. Occasionally resides with friend, also gets services from [facility]. Has slept outside as well.    Social Determinants of Health   Financial Resource Strain: Low Risk  (05/21/2022)   Overall Financial Resource Strain (CARDIA)    Difficulty of Paying Living Expenses: Not very hard  Food Insecurity: Food Insecurity Present (05/21/2022)    Hunger Vital Sign    Worried About Running Out of Food in the Last Year: Sometimes true    Ran Out of Food in the Last Year: Sometimes true  Transportation Needs: Unmet Transportation Needs (05/21/2022)   PRAPARE - Hydrologist (Medical): Yes    Lack of Transportation (Non-Medical): Yes  Physical Activity: Not on file  Stress: Not on file  Social Connections: Not on file  Intimate Partner Violence: Not on file    Allergies  Allergen Reactions   Nsaids Other (See Comments)    Patient was born with only 1 kidney and was told to not take NSAID(s)    Current Facility-Administered Medications  Medication Dose Route Frequency Provider Last Rate Last Admin   fentaNYL (SUBLIMAZE) injection 50-100 mcg  50-100 mcg Intravenous Q1H PRN Ezequiel Essex, MD   100 mcg at 08/24/22 1427   Current Outpatient Medications  Medication Sig Dispense Refill   B Complex-C-Folic Acid (DIALYVITE Q000111Q) 0.8 MG TABS Take 1 tablet by mouth daily.     calcitRIOL (ROCALTROL) 0.5 MCG capsule Take 0.5 mcg by mouth daily. (Patient not taking: Reported on 07/11/2022)     carvedilol (COREG) 6.25 MG tablet Take 1 tablet (6.25 mg total) by mouth 2 (two) times daily. 60 tablet 0   SENSIPAR 30 MG tablet Take 30 mg by mouth daily with supper. (Patient not taking: Reported on 07/11/2022)     sucroferric oxyhydroxide (VELPHORO) 500 MG chewable tablet Chew 500 mg by mouth 3 (three) times daily with meals.      PHYSICAL EXAM Vitals:   08/24/22 1152 08/24/22 1315 08/24/22 1321 08/24/22 1430  BP: (!) 185/103 (!) 175/92 (!) 189/99   Pulse: 76 70 68 70  Resp:   19 12  Temp: 98.6 F (37 C)  98 F (36.7 C)   TempSrc:   Oral   SpO2: 100% 100% 100% 100%  Weight:      Height:       Well-appearing man in no acute distress Regular rate and rhythm Unlabored breathing Left upper extremity swollen about the upper arm.  Palpable thrill is noted.  The arm is exquisitely tender.  2+ radial  pulse.   PERTINENT LABORATORY AND RADIOLOGIC DATA  Most recent CBC    Latest Ref Rng & Units 08/24/2022   11:59 AM 05/22/2022    3:06 AM 05/21/2022    2:24 AM  CBC  WBC 4.0 - 10.5 K/uL 6.7  6.4  6.3   Hemoglobin 13.0 - 17.0 g/dL 9.7  7.4  8.2   Hematocrit 39.0 - 52.0 % 29.3  21.9  25.3   Platelets 150 - 400 K/uL 127  102  116      Most recent CMP    Latest Ref Rng & Units 08/24/2022   11:59 AM 05/22/2022    3:06 AM 05/21/2022    7:24 AM  CMP  Glucose  70 - 99 mg/dL 89  100    BUN 6 - 20 mg/dL 52  50    Creatinine 0.61 - 1.24 mg/dL 12.67  14.11    Sodium 135 - 145 mmol/L 142  132    Potassium 3.5 - 5.1 mmol/L 4.2  4.0  4.7   Chloride 98 - 111 mmol/L 98  92    CO2 22 - 32 mmol/L 30  25    Calcium 8.9 - 10.3 mg/dL 9.3  7.6      Renal function Estimated Creatinine Clearance: 6.1 mL/min (A) (by C-G formula based on SCr of 12.67 mg/dL (H)).  Hgb A1c MFr Bld (%)  Date Value  05/07/2021 4.6 (L)    LDL Cholesterol  Date Value Ref Range Status  05/07/2021 58 0 - 99 mg/dL Final    Comment:           Total Cholesterol/HDL:CHD Risk Coronary Heart Disease Risk Table                     Men   Women  1/2 Average Risk   3.4   3.3  Average Risk       5.0   4.4  2 X Average Risk   9.6   7.1  3 X Average Risk  23.4   11.0        Use the calculated Patient Ratio above and the CHD Risk Table to determine the patient's CHD Risk.        ATP III CLASSIFICATION (LDL):  <100     mg/dL   Optimal  100-129  mg/dL   Near or Above                    Optimal  130-159  mg/dL   Borderline  160-189  mg/dL   High  >190     mg/dL   Very High Performed at [facility] [address] [place], [place] [address]      Vascular Imaging: Duplex of left upper extremity  There is a pseudoaneurysm noted off the basilic transposition AVF in the  proximal upper medial arm. The pseudoaneurysm measures 1.24 cm X 2.16 cm.  The neck measures 0.513 cm in width and 1.24 cm in length.       Yevonne Aline. Stanford Breed, MD [facility] Vein Specialists of [facility] Phone Number: [phone] 08/24/2022 2:54 PM   Total time spent on preparing this encounter including chart review, data review, collecting history, examining the patient, coordinating care for this established patient, 40 minutes.  Portions of this report may have been transcribed using voice recognition software.  Every effort has been made to ensure accuracy; however, inadvertent computerized transcription errors may still be present.

## 2022-08-24 NOTE — ED Triage Notes (Signed)
Pt here via PTAR for L arm pain at dialysis graft insertion site.  Dialysis MWF.  W was the 2nd day using the graft  and the pt experienced excruciating pain when the needle was removed. Fri pt received dialysis through R chest catheter.    Pt states some numbness to L arm and pain on palpation.  Pulses present.

## 2022-08-24 NOTE — Progress Notes (Addendum)
VASCULAR LAB    Left upper extremity duplex of dialysis access has been performed.  See CV proc for preliminary results.  Messaged results to Dr. Sherry Ruffing and Dr. Jeani Hawking via secure chat  Sharion Dove, RVT 08/24/2022, 1:54 PM

## 2022-08-24 NOTE — Consult Note (Signed)
Renal Service Consult Note [facility]  Phillip Lynch 08/24/2022 Sol Blazing, MD Requesting Physician: Dr. Roosevelt Locks  Reason for Consult: ESRD pt w/  HPI: The patient is a 57 y.o. year-old w/ PMH as below who presented to ED earlier today for new onset pain of his HD access/ fistula. Pt seen in ED by VVS who noted L arm was swollen, he had the new BBF done 05/20/22. Pseuoaneurysm was noted and  plan is for OR repair tomorrow. We are asked to see for dialysis.   Pt seen in room. L arm still hurting. No SOB, cough, orthopnea, has not missed any dialysis.   ROS - denies CP, no joint pain, no HA, no blurry vision, no rash, no diarrhea, no nausea/ vomiting   Past Medical History  Past Medical History:  Diagnosis Date   CHF (congestive heart failure) (HCC)    Congenital renal agenesis, unilateral    Hypertension    Unintentional weight loss    Past Surgical History  Past Surgical History:  Procedure Laterality Date   AV FISTULA PLACEMENT Left 05/13/2021   Procedure: ARTERIOVENOUS (AV) FISTULA CREATION;  Surgeon: Broadus John, MD;  Location: [place];  Service: Vascular;  Laterality: Left;   AV FISTULA PLACEMENT Left 05/20/2022   Procedure: LEFT ARM BASILIC VEIN TRANSPOSITION;  Surgeon: Angelia Mould, MD;  Location: [place];  Service: Vascular;  Laterality: Left;   CAPD INSERTION N/A 04/10/2022   Procedure: LAPAROSCOPIC INSERTION CONTINUOUS AMBULATORY PERITONEAL DIALYSIS  (CAPD) CATHETER WITH OMENTOPEXY;  Surgeon: Cherre Robins, MD;  Location: [place] [facility];  Service: Vascular;  Laterality: N/A;   CAPD INSERTION  05/20/2022   Procedure: PERITONEAL DIALYSIS CATHETER REMOVAL;  Surgeon: Angelia Mould, MD;  Location: [place];  Service: Vascular;;   IR FLUORO GUIDE CV LINE RIGHT  07/15/2021   IR US GUIDE VASC ACCESS RIGHT  07/16/2021   RIGHT/LEFT HEART CATH AND CORONARY ANGIOGRAPHY N/A 02/18/2022   Procedure: RIGHT/LEFT HEART CATH AND CORONARY ANGIOGRAPHY;  Surgeon:  Adrian Prows, MD;  Location: [place] [facility];  Service: Cardiovascular;  Laterality: N/A;   Family History  Family History  Problem Relation Age of Onset   Stroke Mother    Dementia Mother    Kidney disease Mother    Social History  reports that he quit smoking about 16 months ago. His smoking use included cigars. He has never used smokeless tobacco. He reports current alcohol use. He reports that he does not currently use drugs after having used the following drugs: Marijuana. Allergies  Allergies  Allergen Reactions   Nsaids Other (See Comments)    Patient was born with only 1 kidney and was told to not take NSAID(s)   Home medications Prior to Admission medications   Medication Sig Start Date End Date Taking? Authorizing Provider  B Complex-C-Folic Acid (DIALYVITE Q000111Q) 0.8 MG TABS Take 1 tablet by mouth daily.    [provider]  calcitRIOL (ROCALTROL) 0.5 MCG capsule Take 0.5 mcg by mouth daily. Patient not taking: Reported on 07/11/2022 04/28/22   [provider]  carvedilol (COREG) 6.25 MG tablet Take 1 tablet (6.25 mg total) by mouth 2 (two) times daily. 05/22/22 06/21/22  Serita Butcher, MD  SENSIPAR 30 MG tablet Take 30 mg by mouth daily with supper. Patient not taking: Reported on 07/11/2022 10/07/21   [provider]  sucroferric oxyhydroxide (VELPHORO) 500 MG chewable tablet Chew 500 mg by mouth 3 (three) times daily with meals.  [provider]     Vitals:   08/24/22 1454 08/24/22 1600 08/24/22 1801 08/24/22 2025  BP: (!) 179/93 (!) 171/96 (!) 166/98 136/86  Pulse: 70 70 68 71  Resp: '17 14 16 16  '$ Temp: 98.1 F (36.7 C)  98.1 F (36.7 C) 98.3 F (36.8 C)  TempSrc: Oral  Oral Oral  SpO2: 100% 100% 100% 99%  Weight:      Height:       Exam Gen alert, no distress No rash, cyanosis or gangrene Sclera anicteric, throat clear  No jvd or bruits Chest clear bilat to bases, no rales/ wheezing RRR no MRG Abd soft ntnd no mass or  ascites +bs GU normal male MS no joint effusions or deformity Ext 2+ LUE edema Neuro is alert, Ox 3 , nf    LUA AVF+bruit / TDC intact       Home meds include - coreg 6.25 bid, censipar 30 hs, velphoro 1 ac tid, dialyvite, prns     OP HD: MWF G-O  4h  400/800   64.5kg  2/2.5 bath  TDC   Hep 6500 - last HD 3/08, post wt 64.8kg - rocaltrol 1.0 mcg po tiw - mircera 200 mcg IV q 2 wks, has not started yet   Assessment/ Plan: L arm pain - per VVS likely due to pseudoaneurysm of L arm AVF. Fistula was just accessed last week for the 1st time since created in dec 2023. To OR tomorrow am.  ESRD - on HD MWF. Has not missed HD, using TDC. Schedule HD tomorrow around surgery.  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 Last esa in OP unit was in January, due for high dose esa to start next HD. Will dose tomorrow w/ darbe 200 mcg SQ weekly while here.  MBD ckd - Ca in range, cont binder and po vdra tiw.     [name]  MD [facility] 08/24/2022, 8:30 PM  Recent Labs  Lab 08/24/22 1159  HGB 9.7*  CALCIUM 9.3  CREATININE 12.67*  K 4.2   Inpatient medications:  carvedilol  6.25 mg Oral BID   multivitamin  1 tablet Oral QHS   sucroferric oxyhydroxide  500 mg Oral TID WC    [START ON 08/25/2022]  ceFAZolin (ANCEF) IV     fentaNYL (SUBLIMAZE) injection, hydrALAZINE, HYDROmorphone (DILAUDID) injection, ondansetron **OR** ondansetron (ZOFRAN) IV, oxyCODONE

## 2022-08-24 NOTE — ED Notes (Signed)
ED TO INPATIENT HANDOFF REPORT  ED Nurse Name and Phone #: Waynetta Sandy [phone]  S Name/Age/Gender Phillip Lynch 57 y.o. male Room/Bed: 021C/021C  Code Status   Code Status: Full Code  Home/SNF/Other Home Patient oriented to: self, place, time, and situation Is this baseline? Yes   Triage Complete: Triage complete  Chief Complaint Pseudoaneurysm [facility]) [I72.9]  Triage Note Pt here via PTAR for L arm pain at dialysis graft insertion site.  Dialysis MWF.  W was the 2nd day using the graft  and the pt experienced excruciating pain when the needle was removed. Fri pt received dialysis through R chest catheter.    Pt states some numbness to L arm and pain on palpation.  Pulses present.     Allergies Allergies  Allergen Reactions   Nsaids Other (See Comments)    Patient was born with only 1 kidney and was told to not take NSAID(s)    Level of Care/Admitting Diagnosis ED Disposition     ED Disposition  Admit   Condition  --   [place]: [place] [[id number]]  Level of Care: Telemetry Medical [104]  May place patient in observation at [facility] or [facility] if equivalent level of care is available:: No  Covid Evaluation: Asymptomatic - no recent exposure (last 10 days) testing not required  Diagnosis: Pseudoaneurysm [facility]) [id number]  Admitting Physician: Lequita Halt [id number]  Attending Physician: Lequita Halt [id number]          B Medical/Surgery History Past Medical History:  Diagnosis Date   CHF (congestive heart failure) ([place])    Congenital renal agenesis, unilateral    Hypertension    Unintentional weight loss    Past Surgical History:  Procedure Laterality Date   AV FISTULA PLACEMENT Left 05/13/2021   Procedure: ARTERIOVENOUS (AV) FISTULA CREATION;  Surgeon: Broadus John, MD;  Location: [place];  Service: Vascular;  Laterality: Left;   AV FISTULA PLACEMENT Left 05/20/2022   Procedure: LEFT ARM BASILIC VEIN  TRANSPOSITION;  Surgeon: Angelia Mould, MD;  Location: [place];  Service: Vascular;  Laterality: Left;   CAPD INSERTION N/A 04/10/2022   Procedure: LAPAROSCOPIC INSERTION CONTINUOUS AMBULATORY PERITONEAL DIALYSIS  (CAPD) CATHETER WITH OMENTOPEXY;  Surgeon: Cherre Robins, MD;  Location: [place] [facility];  Service: Vascular;  Laterality: N/A;   CAPD INSERTION  05/20/2022   Procedure: PERITONEAL DIALYSIS CATHETER REMOVAL;  Surgeon: Angelia Mould, MD;  Location: [place];  Service: Vascular;;   IR FLUORO GUIDE CV LINE RIGHT  07/15/2021   IR US GUIDE VASC ACCESS RIGHT  07/16/2021   RIGHT/LEFT HEART CATH AND CORONARY ANGIOGRAPHY N/A 02/18/2022   Procedure: RIGHT/LEFT HEART CATH AND CORONARY ANGIOGRAPHY;  Surgeon: Adrian Prows, MD;  Location: [place] [facility];  Service: Cardiovascular;  Laterality: N/A;     A IV Location/Drains/Wounds Patient Lines/Drains/Airways Status     Active Line/Drains/Airways     Name Placement date Placement time Site Days   Peripheral IV 08/24/22 20 G Anterior;Distal;Right Wrist 08/24/22  1217  Wrist  less than 1   Fistula / Graft Left Upper arm Arteriovenous fistula 05/13/21  1136  Upper arm  468   Fistula / Graft Left Upper arm Arteriovenous fistula 05/20/22  1435  Upper arm  96   Hemodialysis Catheter Right Subclavian Double lumen Permanent (Tunneled) 07/15/21  1155  Subclavian  405            Intake/Output Last 24 hours No intake or output  data in the 24 hours ending 08/24/22 1529  Labs/Imaging Results for orders placed or performed during the hospital encounter of 08/24/22 (from the past 48 hour(s))  CBC with Differential     Status: Abnormal   Collection Time: 08/24/22 11:59 AM  Result Value Ref Range   WBC 6.7 4.0 - 10.5 K/uL   RBC 2.87 (L) 4.22 - 5.81 MIL/uL   Hemoglobin 9.7 (L) 13.0 - 17.0 g/dL   HCT 29.3 (L) 39.0 - 52.0 %   MCV 102.1 (H) 80.0 - 100.0 fL   MCH 33.8 26.0 - 34.0 pg   MCHC 33.1 30.0 - 36.0 g/dL   RDW 14.2 11.5 - 15.5 %    Platelets 127 (L) 150 - 400 K/uL   nRBC 0.0 0.0 - 0.2 %   Neutrophils Relative % 76 %   Neutro Abs 5.0 1.7 - 7.7 K/uL   Lymphocytes Relative 17 %   Lymphs Abs 1.1 0.7 - 4.0 K/uL   Monocytes Relative 6 %   Monocytes Absolute 0.4 0.1 - 1.0 K/uL   Eosinophils Relative 1 %   Eosinophils Absolute 0.1 0.0 - 0.5 K/uL   Basophils Relative 0 %   Basophils Absolute 0.0 0.0 - 0.1 K/uL   Immature Granulocytes 0 %   Abs Immature Granulocytes 0.02 0.00 - 0.07 K/uL    Comment: Performed at [facility], [address] [place], [place] [address]  Lactic acid, plasma     Status: Abnormal   Collection Time: 08/24/22 11:59 AM  Result Value Ref Range   Lactic Acid, Venous 2.0 (HH) 0.5 - 1.9 mmol/L    Comment: CRITICAL RESULT CALLED TO, READ BACK BY AND VERIFIED WITH E,ANELLO RN '@1312'$  08/24/22 E,[place]ENT[place] Performed at [facility], [place] [address] [place], [place] [id number]   Basic metabolic panel     Status: Abnormal   Collection Time: 08/24/22 11:59 AM  Result Value Ref Range   Sodium 142 135 - 145 mmol/L   Potassium 4.2 3.5 - 5.1 mmol/L   Chloride 98 98 - 111 mmol/L   CO2 30 22 - 32 mmol/L   Glucose, Bld 89 70 - 99 mg/dL    Comment: Glucose reference range applies only to samples taken after fasting for at least 8 hours.   BUN 52 (H) 6 - 20 mg/dL   Creatinine, Ser 12.67 (H) 0.61 - 1.24 mg/dL   Calcium 9.3 8.9 - 10.3 mg/dL   GFR, Estimated 4 (L) >60 mL/min    Comment: (NOTE) Calculated using the CKD-EPI Creatinine Equation (2021)    Anion gap 14 5 - 15    Comment: Performed at [facility] [address], [place], [place] [address]   VAS US DUPLEX DIALYSIS ACCESS (AVF, AVG)  Result Date: 08/24/2022 DIALYSIS ACCESS Patient Name:  Phillip Lynch  Date of Exam:   08/24/2022 Medical Rec #: [id number]      Accession #:    [id number] Date of Birth: [birth year]      Patient Gender: M Patient Age:   38 years Exam Location:  [facility] Procedure:      VAS US DUPLEX DIALYSIS ACCESS  (AVF, AVG) Referring Phys: Marda Stalker --------------------------------------------------------------------------------  Reason for Exam: Swelling and pain status post dialysis 08/20/22. Access Site: Left Upper Extremity. Access Type: Basilic vein transposition. History: Patient had left basilic vein transposition 05/20/22. He began using          fistula last week on 08/18/22. After second session 3/6,  he felt sharp          pain when needle was removed and has had progressive swelling and pain          since. Received dialysis Friday via [facility]. Limitations: Patient in severe pain with minimal touch Comparison Study: Prior duplex of dialysis access done 07/11/22 Performing Technologist: Sharion Dove RVS  Examination Guidelines: A complete evaluation includes B-mode imaging, spectral Doppler, color Doppler, and power Doppler as needed of all accessible portions of each vessel. Unilateral testing is considered an integral part of a complete examination. Limited examinations for reoccurring indications may be performed as noted.  Findings:    Summary: There is a pseudoaneurysm noted off the basilic transposition AVF in the proximal upper medial arm. The pseudoaneurysm measures 1.24 cm X 2.16 cm. The neck measures 0.513 cm in width and 1.24 cm in length.  *See table(s) above for measurements and observations.    --------------------------------------------------------------------------------   Preliminary     Pending Labs Unresulted Labs (From admission, onward)     Start     Ordered   08/25/22 [id number]  Basic metabolic panel  Tomorrow morning,   R        08/24/22 1517   08/25/22 0500  CBC  Tomorrow morning,   R        08/24/22 1517   08/24/22 1159  Blood culture (routine x 2)  BLOOD CULTURE X 2,   R (with STAT occurrences)      08/24/22 1158            Vitals/Pain Today's Vitals   08/24/22 1430 08/24/22 1445 08/24/22 1454 08/24/22 1454  BP:   (!) 179/93   Pulse: 70 71 70   Resp: '12 11 17    '$ Temp:   98.1 F (36.7 C)   TempSrc:   Oral   SpO2: 100% 100% 100%   Weight:      Height:      PainSc:    8     Isolation Precautions No active isolations  Medications Medications  fentaNYL (SUBLIMAZE) injection 50-100 mcg (100 mcg Intravenous Given 08/24/22 1529)  carvedilol (COREG) tablet 6.25 mg (has no administration in time range)  sucroferric oxyhydroxide (VELPHORO) chewable tablet 500 mg (has no administration in time range)  Dialyvite 800 TABS 1 tablet (has no administration in time range)  HYDROmorphone (DILAUDID) injection 0.5-1 mg (has no administration in time range)  ondansetron (ZOFRAN) tablet 4 mg (has no administration in time range)    Or  ondansetron (ZOFRAN) injection 4 mg (has no administration in time range)  oxyCODONE (Oxy IR/ROXICODONE) immediate release tablet 5 mg (has no administration in time range)  hydrALAZINE (APRESOLINE) injection 5 mg (has no administration in time range)  fentaNYL (SUBLIMAZE) injection 25 mcg (25 mcg Intravenous Given 08/24/22 1219)    Mobility walks     Focused Assessments Pt has swelling pain and warmth with limited rom to upper left arm   R Recommendations: See Admitting Provider Note  Report given to:   Additional Notes: .

## 2022-08-24 NOTE — ED Notes (Signed)
Pt at vascular.

## 2022-08-24 NOTE — ED Provider Notes (Cosign Needed Addendum)
[facility] Provider Note  CSN: [id number] Arrival date & time: 08/24/22  1140  History  Chief Complaint  Patient presents with   Arm Pain   Phillip Lynch is a 57 y.o. male.  Phillip Lynch is a 57 year old man with PMH ESRD on HD here with left fistula pain.  New left upper arm fistula, was used for the first time last Monday 3/4 and Wednesday 3/6.  At the Wednesday dialysis, patient reports terribly sharp pain of the fistula.  For Friday dialysis, he refused to let them use the fistula and instead had them use the right central line.  The fistula was initially fine at rest without any pain until overnight last night.  Patient reports his left upper arm has begun swelling and producing a few shooting pain.  His left forearm and hand are cooler than the right with tingling sensation.  Denies fevers, chills, nausea, vomiting.  No prodromal illness.  Does not produce any urine at this time.    Home Medications Prior to Admission medications   Medication Sig Start Date End Date Taking? Authorizing Provider  B Complex-C-Folic Acid (DIALYVITE Q000111Q) 0.8 MG TABS Take 1 tablet by mouth daily.    [provider]  calcitRIOL (ROCALTROL) 0.5 MCG capsule Take 0.5 mcg by mouth daily. Patient not taking: Reported on 07/11/2022 04/28/22   [provider]  carvedilol (COREG) 6.25 MG tablet Take 1 tablet (6.25 mg total) by mouth 2 (two) times daily. 05/22/22 06/21/22  Serita Butcher, MD  SENSIPAR 30 MG tablet Take 30 mg by mouth daily with supper. Patient not taking: Reported on 07/11/2022 10/07/21   [provider]  sucroferric oxyhydroxide (VELPHORO) 500 MG chewable tablet Chew 500 mg by mouth 3 (three) times daily with meals.    [provider]     Allergies    Nsaids    Review of Systems   Review of Systems  Constitutional:  Negative for activity change, appetite change, chills, diaphoresis, fatigue and fever.   Gastrointestinal:  Negative for abdominal pain, constipation, diarrhea, nausea and vomiting.  Skin:  Positive for color change, pallor and wound.   Physical Exam Updated Vital Signs BP (!) 179/93 (BP Location: Right Arm)   Pulse 70   Temp 98.1 F (36.7 C) (Oral)   Resp 17   Ht '5\' 9"'$  (1.753 m)   Wt 67.3 kg   SpO2 100%   BMI 21.91 kg/m  Physical Exam Vitals and nursing note reviewed.  Constitutional:      General: He is in acute distress.     Appearance: Normal appearance. He is normal weight. He is not ill-appearing, toxic-appearing or diaphoretic.  HENT:     Head: Normocephalic and atraumatic.  Cardiovascular:     Rate and Rhythm: Normal rate and regular rhythm.     Pulses: Normal pulses.     Heart sounds: Normal heart sounds.  Pulmonary:     Effort: Pulmonary effort is normal.     Breath sounds: Normal breath sounds.  Abdominal:     General: Abdomen is flat. Bowel sounds are normal.     Palpations: Abdomen is soft.  Skin:    General: Skin is cool.     Coloration: Skin is cyanotic.     Findings: Abrasion and wound present.     Comments:  Left hand and arm cool to touch with palpable pulses, left upper arm with notable swelling, excruciating TTP over medial, anterior, and lateral left upper  arm.  Posterior left upper arm nontender.  Left fistula with triangle shaped abrasion overlying and a small linear bulla with clear fluid.  Neurological:     Mental Status: He is alert and oriented to person, place, and time.    ED Results / Procedures / Treatments   Labs (all labs ordered are listed, but only abnormal results are displayed) Labs Reviewed  CBC WITH DIFFERENTIAL/PLATELET - Abnormal; Notable for the following components:      Result Value   RBC 2.87 (*)    Hemoglobin 9.7 (*)    HCT 29.3 (*)    MCV 102.1 (*)    Platelets 127 (*)    All other components within normal limits  LACTIC ACID, PLASMA - Abnormal; Notable for the following components:   Lactic Acid,  Venous 2.0 (*)    All other components within normal limits  BASIC METABOLIC PANEL - Abnormal; Notable for the following components:   BUN 52 (*)    Creatinine, Ser 12.67 (*)    GFR, Estimated 4 (*)    All other components within normal limits  CULTURE, BLOOD (ROUTINE X 2)  CULTURE, BLOOD (ROUTINE X 2)   EKG EKG Interpretation  Date/Time:  Sunday August 24 2022 13:20:05 EDT Ventricular Rate:  68 PR Interval:  159 QRS Duration: 88 QT Interval:  464 QTC Calculation: 494 R Axis:   -7 Text Interpretation: Sinus rhythm Probable left atrial enlargement Anteroseptal infarct, age indeterminate ST elevation, consider inferior injury when compared to prior, similar appearance. shortner QTc. No STEMI Confirmed by Antony Blackbird [phone]) on 08/24/2022 1:23:56 PM  Radiology VAS [place] (AVF, AVG)  Result Date: 08/24/2022 DIALYSIS ACCESS Patient Name:  Phillip Lynch  Date of Exam:   08/24/2022 Medical Rec #: [id number]      Accession #:    [id number] Date of Birth: Aug 20, 1965      Patient Gender: M Patient Age:   57 years Exam Location:  [facility] Procedure:      VAS US DUPLEX DIALYSIS ACCESS (AVF, AVG) Referring Phys: Marda Stalker --------------------------------------------------------------------------------  Reason for Exam: Swelling and pain status post dialysis 08/20/22. Access Site: Left Upper Extremity. Access Type: Basilic vein transposition. History: Patient had left basilic vein transposition 05/20/22. He began using          fistula last week on 08/18/22. After second session 3/6, he felt sharp          pain when needle was removed and has had progressive swelling and pain          since. Received dialysis Friday via [facility]. Limitations: Patient in severe pain with minimal touch Comparison Study: Prior duplex of dialysis access done 07/11/22 Performing Technologist: Sharion Dove RVS  Examination Guidelines: A complete evaluation includes B-mode imaging, spectral  Doppler, color Doppler, and power Doppler as needed of all accessible portions of each vessel. Unilateral testing is considered an integral part of a complete examination. Limited examinations for reoccurring indications may be performed as noted.  Findings:    Summary: There is a pseudoaneurysm noted off the basilic transposition AVF in the proximal upper medial arm. The pseudoaneurysm measures 1.24 cm X 2.16 cm. The neck measures 0.513 cm in width and 1.24 cm in length.  *See table(s) above for measurements and observations.    --------------------------------------------------------------------------------   Preliminary     Procedures Procedures   Medications Ordered in ED Medications  fentaNYL (SUBLIMAZE) injection 50-100 mcg (100 mcg Intravenous Given 08/24/22 1427)  fentaNYL (  SUBLIMAZE) injection 25 mcg (25 mcg Intravenous Given 08/24/22 1219)   ED Course/ Medical Decision Making/ A&P  Medical Decision Making 57 year old male with PMH ESRD on HD here with acute onset left fistula pain.  Pain out of proportion to exam, concern for fistula thrombosis, infection, or compartment syndrome.  Spoke with vascular surgeon Dr. Standley Dakins, will order [place] of fistula. Will obtain labs, have ordered BMP, lactic acid, CBC, and blood cultures.  Fentanyl IV for pain.  1 PM: Left arm continues to be painful despite 75 mcg fentanyl total at this time. Left arm objectively cooler than initial exam. Fistula ultrasound not yet completed, ED secretary will call radiology to update on urgent need.  BMP returned consistent with ESRD on HD, potassium within normal limits.  CBC shows anemia improved from 3 months ago.  Platelets 127, again improved from previous.  Awaiting lactic acid and VVS evaluation.  2:30 PM: Ultrasound shows pseudoaneurysm of fistula - discussed with vascular surgeon Dr. Stanford Breed, who will come see patient.  VVS leaning towards discharge home with close clinic follow-up at this time.  Will await VVS to  evaluate, will provide pain control in the meantime. Wonder if patient may need admission for pain control and dialysis while awaiting interval improvement. Signout provided to oncoming ED physician to complete plan of care.  3 PM: VVS evaluated at bedside, plan for admit to medicine and OR tomorrow. Will consult hospitalist for unassigned admission and nephro for HD needs.   3:05 PM: Spoke with nephro, they will get him on list for HD with right TDC on Monday.   3:10 PM: Spoke with hospitalist, they will admit.   Amount and/or Complexity of Data Reviewed Labs: ordered. Decision-making details documented in ED Course.    Details: BMP, lactic acid, CBC, blood cultures  Radiology: ordered. Decision-making details documented in ED Course.    Details: Fistula ultrasound duplex  Risk Prescription drug management.  Final Clinical Impression(s) / ED Diagnoses Final diagnoses:  Pain from A/V fistula ([place])  Pseudoaneurysm ([place])   Rx / DC Orders ED Discharge Orders     None      Ezequiel Essex, MD   Ezequiel Essex, MD 08/24/22 1436    Ezequiel Essex, MD 08/24/22 1503    Ezequiel Essex, MD 08/24/22 1507    Ezequiel Essex, MD 08/24/22 1510    Tegeler, Gwenyth Allegra, MD 08/26/22 1514

## 2022-08-24 NOTE — H&P (Signed)
History and Physical    Phillip Lynch [id number] DOB: 07-29-1965 DOA: 08/24/2022  PCP: Patient, [name] (Confirm with patient/family/NH records and if not entered, this has to be entered at [facility] point of entry) Patient coming from: Home  I have personally briefly reviewed patient's old medical records in [place]  Chief Complaint: Left arm swelling and pain  HPI: Phillip Lynch is a 57 y.o. male with medical history significant of ESRD on HD with a newly started left arm A-V graft, chronic HFpEF, HTN, presented with left arm pain and unable to go HD.  Patient recently started to use left upper arm AV fistula placed on May 20, 2022.  Last Monday patient had his first HD via the new AV fistula and with no event.  Wednesday, immediately after the HD session, he started to feel 10/10 sharp pain on the location of the left upper arm AV fistula site over the last 2 days the site has been swelling.  Patient went to HD center yesterday, and was recommended by staff to use ice/heat pack which he did but this morning the pain became unbearable and he started to have pain of left shoulder as well.  Denies any chest pain no shortness of breath.  Denies any numbness or pain of left fingertips or hand.  ED Course: Blood pressure elevated, vascular surgeon consult required who ordered a vascular duplex of left arm which showed pseudoaneurysm.  WBC 6.9, creatinine 12, BUN 52, K4.2, bicarb 30.  Review of Systems: As per HPI otherwise 14 point review of systems negative.    Past Medical History:  Diagnosis Date   CHF (congestive heart failure) (HCC)    Congenital renal agenesis, unilateral    Hypertension    Unintentional weight loss     Past Surgical History:  Procedure Laterality Date   AV FISTULA PLACEMENT Left 05/13/2021   Procedure: ARTERIOVENOUS (AV) FISTULA CREATION;  Surgeon: Broadus John, MD;  Location: [place];  Service: Vascular;  Laterality: Left;   AV FISTULA PLACEMENT  Left 05/20/2022   Procedure: LEFT ARM BASILIC VEIN TRANSPOSITION;  Surgeon: Angelia Mould, MD;  Location: [place];  Service: Vascular;  Laterality: Left;   CAPD INSERTION N/A 04/10/2022   Procedure: LAPAROSCOPIC INSERTION CONTINUOUS AMBULATORY PERITONEAL DIALYSIS  (CAPD) CATHETER WITH OMENTOPEXY;  Surgeon: Cherre Robins, MD;  Location: [facility];  Service: Vascular;  Laterality: N/A;   CAPD INSERTION  05/20/2022   Procedure: PERITONEAL DIALYSIS CATHETER REMOVAL;  Surgeon: Angelia Mould, MD;  Location: [employer];  Service: Vascular;;   IR FLUORO GUIDE CV LINE RIGHT  07/15/2021   IR US GUIDE VASC ACCESS RIGHT  07/16/2021   RIGHT/LEFT HEART CATH AND CORONARY ANGIOGRAPHY N/A 02/18/2022   Procedure: RIGHT/LEFT HEART CATH AND CORONARY ANGIOGRAPHY;  Surgeon: Adrian Prows, MD;  Location: [place] [facility];  Service: Cardiovascular;  Laterality: N/A;     reports that he quit smoking about 16 months ago. His smoking use included cigars. He has never used smokeless tobacco. He reports current alcohol use. He reports that he does not currently use drugs after having used the following drugs: Marijuana.  Allergies  Allergen Reactions   Nsaids Other (See Comments)    Patient was born with only 1 kidney and was told to not take NSAID(s)    Family History  Problem Relation Age of Onset   Stroke Mother    Dementia Mother    Kidney disease Mother      Prior  to Admission medications   Medication Sig Start Date End Date Taking? Authorizing Provider  B Complex-C-Folic Acid (DIALYVITE Q000111Q) 0.8 MG TABS Take 1 tablet by mouth daily.    [provider]  calcitRIOL (ROCALTROL) 0.5 MCG capsule Take 0.5 mcg by mouth daily. Patient not taking: Reported on 07/11/2022 04/28/22   [provider]  carvedilol (COREG) 6.25 MG tablet Take 1 tablet (6.25 mg total) by mouth 2 (two) times daily. 05/22/22 06/21/22  Serita Butcher, MD  SENSIPAR 30 MG tablet Take 30 mg by mouth daily with supper. Patient  not taking: Reported on 07/11/2022 10/07/21   [provider]  sucroferric oxyhydroxide (VELPHORO) 500 MG chewable tablet Chew 500 mg by mouth 3 (three) times daily with meals.    [provider]    Physical Exam: Vitals:   08/24/22 1321 08/24/22 1430 08/24/22 1445 08/24/22 1454  BP: (!) 189/99   (!) 179/93  Pulse: 68 70 71 70  Resp: '19 12 11 17  '$ Temp: 98 F (36.7 C)   98.1 F (36.7 C)  TempSrc: Oral   Oral  SpO2: 100% 100% 100% 100%  Weight:      Height:        Constitutional: NAD, calm, comfortable Vitals:   08/24/22 1321 08/24/22 1430 08/24/22 1445 08/24/22 1454  BP: (!) 189/99   (!) 179/93  Pulse: 68 70 71 70  Resp: '19 12 11 17  '$ Temp: 98 F (36.7 C)   98.1 F (36.7 C)  TempSrc: Oral   Oral  SpO2: 100% 100% 100% 100%  Weight:      Height:       Eyes: PERRL, lids and conjunctivae normal ENMT: Mucous membranes are moist. Posterior pharynx clear of any exudate or lesions.Normal dentition.  Neck: normal, supple, no masses, no thyromegaly Respiratory: clear to auscultation bilaterally, no wheezing, no crackles. Normal respiratory effort. No accessory muscle use.  Cardiovascular: Regular rate and rhythm, no murmurs / rubs / gallops. No extremity edema. 2+ pedal pulses. No carotid bruits.  Abdomen: no tenderness, no masses palpated. No hepatosplenomegaly. Bowel sounds positive.  Musculoskeletal: Significant swelling and tender of left upper arm, left 5 fingertips below refill brisk, moving left wrist and joints of left hand without difficulties no significant swelling of left forearm or hand Skin: no rashes, lesions, ulcers. No induration Neurologic: CN 2-12 grossly intact. Sensation intact, DTR normal. Strength 5/5 in all 4.  Psychiatric: Normal judgment and insight. Alert and oriented x 3. Normal mood.     Labs on Admission: I have personally reviewed following labs and imaging studies  CBC: Recent Labs  Lab 08/24/22 1159  WBC 6.7  NEUTROABS 5.0   HGB 9.7*  HCT 29.3*  MCV 102.1*  PLT [id number]*   Basic Metabolic Panel: Recent Labs  Lab 08/24/22 1159  NA 142  K 4.2  CL 98  CO2 30  GLUCOSE 89  BUN 52*  CREATININE 12.67*  CALCIUM 9.3   GFR: Estimated Creatinine Clearance: 6.1 mL/min (A) (by C-G formula based on SCr of 12.67 mg/dL (H)). Liver Function Tests: No results for input(s): "AST", "ALT", "ALKPHOS", "BILITOT", "PROT", "ALBUMIN" in the last 168 hours. No results for input(s): "LIPASE", "AMYLASE" in the last 168 hours. No results for input(s): "AMMONIA" in the last 168 hours. Coagulation Profile: No results for input(s): "INR", "PROTIME" in the last 168 hours. Cardiac Enzymes: No results for input(s): "CKTOTAL", "CKMB", "CKMBINDEX", "TROPONINI" in the last 168 hours. BNP (last 3 results) No results for input(s): "PROBNP"  in the last 8760 hours. HbA1C: No results for input(s): "HGBA1C" in the last 72 hours. CBG: No results for input(s): "GLUCAP" in the last 168 hours. Lipid Profile: No results for input(s): "CHOL", "HDL", "LDLCALC", "TRIG", "CHOLHDL", "LDLDIRECT" in the last 72 hours. Thyroid Function Tests: No results for input(s): "TSH", "T4TOTAL", "FREET4", "T3FREE", "THYROIDAB" in the last 72 hours. Anemia Panel: No results for input(s): "VITAMINB12", "FOLATE", "FERRITIN", "TIBC", "IRON", "RETICCTPCT" in the last 72 hours. Urine analysis:    Component Value Date/Time   COLORURINE YELLOW 05/06/2021 1003   APPEARANCEUR CLOUDY (A) 05/06/2021 1003   LABSPEC 1.013 05/06/2021 1003   PHURINE 5.0 05/06/2021 1003   GLUCOSEU NEGATIVE 05/06/2021 1003   HGBUR MODERATE (A) 05/06/2021 1003   BILIRUBINUR NEGATIVE 05/06/2021 1003   KETONESUR 5 (A) 05/06/2021 1003   PROTEINUR 100 (A) 05/06/2021 1003   UROBILINOGEN 0.2 09/24/2009 0230   NITRITE NEGATIVE 05/06/2021 1003   LEUKOCYTESUR LARGE (A) 05/06/2021 1003    Radiological Exams on Admission: VAS [place] (AVF, AVG)  Result Date:  08/24/2022 DIALYSIS ACCESS Patient Name:  VERTIS GOPALAN  Date of Exam:   08/24/2022 Medical Rec #: [id number]      Accession #:    [id number] Date of Birth: [birth year]      Patient Gender: M Patient Age:   20 years Exam Location:  [facility] Procedure:      VAS US DUPLEX DIALYSIS ACCESS (AVF, AVG) Referring Phys: Marda Stalker --------------------------------------------------------------------------------  Reason for Exam: Swelling and pain status post dialysis 08/20/22. Access Site: Left Upper Extremity. Access Type: Basilic vein transposition. History: Patient had left basilic vein transposition 05/20/22. He began using          fistula last week on 08/18/22. After second session 3/6, he felt sharp          pain when needle was removed and has had progressive swelling and pain          since. Received dialysis Friday via [facility]. Limitations: Patient in severe pain with minimal touch Comparison Study: Prior duplex of dialysis access done 07/11/22 Performing Technologist: Sharion Dove RVS  Examination Guidelines: A complete evaluation includes B-mode imaging, spectral Doppler, color Doppler, and power Doppler as needed of all accessible portions of each vessel. Unilateral testing is considered an integral part of a complete examination. Limited examinations for reoccurring indications may be performed as noted.  Findings:    Summary: There is a pseudoaneurysm noted off the basilic transposition AVF in the proximal upper medial arm. The pseudoaneurysm measures 1.24 cm X 2.16 cm. The neck measures 0.513 cm in width and 1.24 cm in length.  *See table(s) above for measurements and observations.    --------------------------------------------------------------------------------   Preliminary     EKG: Independently reviewed.  Sinus rhythm, similar ST changes on V1 to V3 V4 and V5 as before  Assessment/Plan Principal Problem:   Pseudoaneurysm (HCC) Active Problems:   End stage renal disease on dialysis  (HCC)   Anemia of chronic kidney failure, stage 5 (HCC)   Pseudoaneurysm of AV hemodialysis fistula (HCC)  (please populate well all problems here in Problem List. (For example, if patient is on BP meds at home and you resume or decide to hold them, it is a problem that needs to be her. Same for CAD, COPD, HLD and so on)  Left brachial pseudoaneurysm -Vascular surgeon consultation appreciated, who suspect traumatic cannulization and plan for the OR repair tomorrow -Pain control with Dilaudid and alternative p.o. oxycodone -Monitor  vital signs and limb circulation, as of now there is no significant compromise of distal circulation or compartment syndrome.  HTN, uncontrolled -Likely secondary to pain of left arm -Resume home BP meds -Add as needed hydralazine  ESRD on HD -Nephro on board, plan for HD via right subclavian catheter tomorrow  Chronic macrocytic anemia -H&H stable  DVT prophylaxis: SCd Code Status: Full code Family Communication: None at bedside Disposition Plan: Expect less than 2 midnight hospital stay Consults called: None Admission status: Telemetry observation   Lequita Halt MD [facility] Pager [phone]  08/24/2022, 3:19 PM

## 2022-08-24 NOTE — ED Notes (Signed)
Critical lab lactic acid 2.0 provider and staff notified.

## 2022-08-25 ENCOUNTER — Observation Stay (HOSPITAL_COMMUNITY): Payer: 59 | Admitting: Certified Registered"

## 2022-08-25 ENCOUNTER — Encounter (HOSPITAL_COMMUNITY): Payer: Self-pay | Admitting: Internal Medicine

## 2022-08-25 ENCOUNTER — Encounter (HOSPITAL_COMMUNITY)
Admission: EM | Disposition: A | Discharge status: Home / Self Care | Payer: Self-pay | Source: Home / Self Care | Attending: Internal Medicine

## 2022-08-25 ENCOUNTER — Inpatient Hospital Stay (HOSPITAL_COMMUNITY): Payer: 59

## 2022-08-25 DIAGNOSIS — Z992 Dependence on renal dialysis: Secondary | ICD-10-CM

## 2022-08-25 DIAGNOSIS — I132 Hypertensive heart and chronic kidney disease with heart failure and with stage 5 chronic kidney disease, or end stage renal disease: Secondary | ICD-10-CM

## 2022-08-25 DIAGNOSIS — E785 Hyperlipidemia, unspecified: Secondary | ICD-10-CM | POA: Diagnosis present

## 2022-08-25 DIAGNOSIS — I5032 Chronic diastolic (congestive) heart failure: Secondary | ICD-10-CM | POA: Diagnosis present

## 2022-08-25 DIAGNOSIS — Z5941 Food insecurity: Secondary | ICD-10-CM | POA: Diagnosis not present

## 2022-08-25 DIAGNOSIS — N186 End stage renal disease: Secondary | ICD-10-CM

## 2022-08-25 DIAGNOSIS — T82511A Breakdown (mechanical) of surgically created arteriovenous shunt, initial encounter: Secondary | ICD-10-CM | POA: Diagnosis present

## 2022-08-25 DIAGNOSIS — T82510S Breakdown (mechanical) of surgically created arteriovenous fistula, sequela: Secondary | ICD-10-CM | POA: Diagnosis not present

## 2022-08-25 DIAGNOSIS — I4729 Other ventricular tachycardia: Secondary | ICD-10-CM | POA: Diagnosis not present

## 2022-08-25 DIAGNOSIS — D631 Anemia in chronic kidney disease: Secondary | ICD-10-CM

## 2022-08-25 DIAGNOSIS — R079 Chest pain, unspecified: Secondary | ICD-10-CM | POA: Diagnosis not present

## 2022-08-25 DIAGNOSIS — T82898A Other specified complication of vascular prosthetic devices, implants and grafts, initial encounter: Secondary | ICD-10-CM | POA: Diagnosis not present

## 2022-08-25 DIAGNOSIS — N189 Chronic kidney disease, unspecified: Secondary | ICD-10-CM

## 2022-08-25 DIAGNOSIS — Z5982 Transportation insecurity: Secondary | ICD-10-CM | POA: Diagnosis not present

## 2022-08-25 DIAGNOSIS — I729 Aneurysm of unspecified site: Secondary | ICD-10-CM | POA: Diagnosis present

## 2022-08-25 DIAGNOSIS — Y832 Surgical operation with anastomosis, bypass or graft as the cause of abnormal reaction of the patient, or of later complication, without mention of misadventure at the time of the procedure: Secondary | ICD-10-CM | POA: Diagnosis present

## 2022-08-25 DIAGNOSIS — Z823 Family history of stroke: Secondary | ICD-10-CM | POA: Diagnosis not present

## 2022-08-25 DIAGNOSIS — Z79899 Other long term (current) drug therapy: Secondary | ICD-10-CM | POA: Diagnosis not present

## 2022-08-25 DIAGNOSIS — R0789 Other chest pain: Secondary | ICD-10-CM | POA: Diagnosis not present

## 2022-08-25 DIAGNOSIS — Q600 Renal agenesis, unilateral: Secondary | ICD-10-CM | POA: Diagnosis not present

## 2022-08-25 DIAGNOSIS — D62 Acute posthemorrhagic anemia: Secondary | ICD-10-CM | POA: Diagnosis not present

## 2022-08-25 DIAGNOSIS — I428 Other cardiomyopathies: Secondary | ICD-10-CM | POA: Diagnosis not present

## 2022-08-25 DIAGNOSIS — I509 Heart failure, unspecified: Secondary | ICD-10-CM | POA: Diagnosis not present

## 2022-08-25 DIAGNOSIS — I160 Hypertensive urgency: Secondary | ICD-10-CM | POA: Diagnosis present

## 2022-08-25 DIAGNOSIS — Z59819 Housing instability, housed unspecified: Secondary | ICD-10-CM | POA: Diagnosis not present

## 2022-08-25 DIAGNOSIS — Z87891 Personal history of nicotine dependence: Secondary | ICD-10-CM

## 2022-08-25 DIAGNOSIS — Z841 Family history of disorders of kidney and ureter: Secondary | ICD-10-CM | POA: Diagnosis not present

## 2022-08-25 DIAGNOSIS — I721 Aneurysm of artery of upper extremity: Secondary | ICD-10-CM | POA: Diagnosis present

## 2022-08-25 DIAGNOSIS — Z888 Allergy status to other drugs, medicaments and biological substances status: Secondary | ICD-10-CM | POA: Diagnosis not present

## 2022-08-25 DIAGNOSIS — I959 Hypotension, unspecified: Secondary | ICD-10-CM | POA: Diagnosis not present

## 2022-08-25 DIAGNOSIS — D696 Thrombocytopenia, unspecified: Secondary | ICD-10-CM | POA: Diagnosis present

## 2022-08-25 DIAGNOSIS — I472 Ventricular tachycardia, unspecified: Secondary | ICD-10-CM | POA: Diagnosis not present

## 2022-08-25 DIAGNOSIS — D539 Nutritional anemia, unspecified: Secondary | ICD-10-CM | POA: Diagnosis present

## 2022-08-25 HISTORY — PX: REVISON OF ARTERIOVENOUS FISTULA: SHX6074

## 2022-08-25 LAB — BASIC METABOLIC PANEL
Anion gap: 11 (ref 5–15)
BUN: 62 mg/dL — ABNORMAL HIGH (ref 6–20)
CO2: 28 mmol/L (ref 22–32)
Calcium: 8.6 mg/dL — ABNORMAL LOW (ref 8.9–10.3)
Chloride: 98 mmol/L (ref 98–111)
Creatinine, Ser: 14.59 mg/dL — ABNORMAL HIGH (ref 0.61–1.24)
GFR, Estimated: 4 mL/min — ABNORMAL LOW (ref >60)
Glucose, Bld: 93 mg/dL (ref 70–99)
Potassium: 3.6 mmol/L (ref 3.5–5.1)
Sodium: 137 mmol/L (ref 135–145)

## 2022-08-25 LAB — CBC
HCT: 23.6 % — ABNORMAL LOW (ref 39.0–52.0)
Hemoglobin: 7.8 g/dL — ABNORMAL LOW (ref 13.0–17.0)
MCH: 33.6 pg (ref 26.0–34.0)
MCHC: 33.1 g/dL (ref 30.0–36.0)
MCV: 101.7 fL — ABNORMAL HIGH (ref 80.0–100.0)
Platelets: 104 10*3/uL — ABNORMAL LOW (ref 150–400)
RBC: 2.32 MIL/uL — ABNORMAL LOW (ref 4.22–5.81)
RDW: 14.2 % (ref 11.5–15.5)
WBC: 5.5 10*3/uL (ref 4.0–10.5)
nRBC: 0 % (ref 0.0–0.2)

## 2022-08-25 LAB — MAGNESIUM: Magnesium: 1.9 mg/dL (ref 1.7–2.4)

## 2022-08-25 LAB — TROPONIN I (HIGH SENSITIVITY)
Troponin I (High Sensitivity): 17 ng/L (ref <18)
Troponin I (High Sensitivity): 14 ng/L (ref <18)

## 2022-08-25 LAB — HEMOGLOBIN AND HEMATOCRIT, BLOOD
HCT: 26.4 % — ABNORMAL LOW (ref 39.0–52.0)
Hemoglobin: 8.5 g/dL — ABNORMAL LOW (ref 13.0–17.0)

## 2022-08-25 SURGERY — REVISON OF ARTERIOVENOUS FISTULA
Anesthesia: General | Laterality: Left

## 2022-08-25 MED ORDER — 0.9 % SODIUM CHLORIDE (POUR BTL) OPTIME
TOPICAL | Status: DC | PRN
  Administered 2022-08-25: 1000 mL

## 2022-08-25 MED ORDER — LIDOCAINE 2% (20 MG/ML) 5 ML SYRINGE
INTRAMUSCULAR | Status: DC | PRN
  Administered 2022-08-25: 50 mg via INTRAVENOUS

## 2022-08-25 MED ORDER — OXYCODONE HCL 5 MG PO TABS: 5.0000 mg | ORAL_TABLET | ORAL | Status: DC | PRN

## 2022-08-25 MED ORDER — OXYCODONE HCL 5 MG PO TABS
5.0000 mg | ORAL_TABLET | Freq: Four times a day (QID) | ORAL | Status: DC | PRN
  Administered 2022-08-25 – 2022-08-26 (×3): 10 mg via ORAL
  Filled 2022-08-25 (×3): qty 2

## 2022-08-25 MED ORDER — NITROGLYCERIN 0.4 MG SL SUBL: 0.4000 mg | SUBLINGUAL_TABLET | SUBLINGUAL | Status: DC | PRN

## 2022-08-25 MED ORDER — ONDANSETRON HCL 4 MG/2ML IJ SOLN: 4.0000 mg | Freq: Four times a day (QID) | INTRAMUSCULAR | Status: DC | PRN

## 2022-08-25 MED ORDER — AMLODIPINE BESYLATE 5 MG PO TABS
5.0000 mg | ORAL_TABLET | Freq: Every day | ORAL | Status: DC
  Administered 2022-08-25 – 2022-08-27 (×3): 5 mg via ORAL
  Filled 2022-08-25 (×3): qty 1

## 2022-08-25 MED ORDER — CHLORHEXIDINE GLUCONATE 0.12 % MT SOLN
15.0000 mL | Freq: Once | OROMUCOSAL | Status: AC
  Administered 2022-08-25: 15 mL via OROMUCOSAL

## 2022-08-25 MED ORDER — MIDAZOLAM HCL 2 MG/2ML IJ SOLN
INTRAMUSCULAR | Status: AC
  Filled 2022-08-25: qty 2

## 2022-08-25 MED ORDER — HEPARIN 6000 UNIT IRRIGATION SOLUTION
Status: AC
  Filled 2022-08-25: qty 500

## 2022-08-25 MED ORDER — ONDANSETRON HCL 4 MG/2ML IJ SOLN
INTRAMUSCULAR | Status: DC | PRN
  Administered 2022-08-25: 4 mg via INTRAVENOUS

## 2022-08-25 MED ORDER — HEPARIN SODIUM (PORCINE) 1000 UNIT/ML IJ SOLN
INTRAMUSCULAR | Status: AC
  Administered 2022-08-25: 1900 [IU]
  Filled 2022-08-25: qty 2

## 2022-08-25 MED ORDER — SODIUM CHLORIDE 0.9 % IV SOLN: INTRAVENOUS | Status: DC

## 2022-08-25 MED ORDER — SODIUM CHLORIDE 0.9 % IV SOLN
20.0000 ug | Freq: Once | INTRAVENOUS | Status: AC
  Administered 2022-08-25: 20 ug via INTRAVENOUS
  Filled 2022-08-25: qty 5

## 2022-08-25 MED ORDER — ACETAMINOPHEN 325 MG PO TABS: 650.0000 mg | ORAL_TABLET | Freq: Four times a day (QID) | ORAL | Status: DC | PRN

## 2022-08-25 MED ORDER — ORAL CARE MOUTH RINSE: 15.0000 mL | Freq: Once | OROMUCOSAL | Status: AC

## 2022-08-25 MED ORDER — FENTANYL CITRATE (PF) 100 MCG/2ML IJ SOLN
INTRAMUSCULAR | Status: AC
  Filled 2022-08-25: qty 2

## 2022-08-25 MED ORDER — FENTANYL CITRATE (PF) 250 MCG/5ML IJ SOLN
INTRAMUSCULAR | Status: AC
  Filled 2022-08-25: qty 5

## 2022-08-25 MED ORDER — HEPARIN 6000 UNIT IRRIGATION SOLUTION
Status: DC | PRN
  Administered 2022-08-25: 1

## 2022-08-25 MED ORDER — OXYCODONE HCL 5 MG PO TABS: 5.0000 mg | ORAL_TABLET | Freq: Once | ORAL | Status: DC | PRN

## 2022-08-25 MED ORDER — HEPARIN SODIUM (PORCINE) 1000 UNIT/ML IJ SOLN
INTRAMUSCULAR | Status: AC
  Administered 2022-08-25: 3800 [IU]
  Filled 2022-08-25: qty 4

## 2022-08-25 MED ORDER — OXYCODONE HCL 5 MG/5ML PO SOLN: 5.0000 mg | Freq: Once | ORAL | Status: DC | PRN

## 2022-08-25 MED ORDER — PROPOFOL 10 MG/ML IV BOLUS
INTRAVENOUS | Status: DC | PRN
  Administered 2022-08-25: 150 mg via INTRAVENOUS

## 2022-08-25 MED ORDER — LOSARTAN POTASSIUM 25 MG PO TABS
25.0000 mg | ORAL_TABLET | Freq: Every day | ORAL | Status: DC
  Administered 2022-08-25 – 2022-08-27 (×3): 25 mg via ORAL
  Filled 2022-08-25 (×3): qty 1

## 2022-08-25 MED ORDER — DEXAMETHASONE SODIUM PHOSPHATE 10 MG/ML IJ SOLN
INTRAMUSCULAR | Status: DC | PRN
  Administered 2022-08-25: 10 mg via INTRAVENOUS

## 2022-08-25 MED ORDER — PROPOFOL 10 MG/ML IV BOLUS
INTRAVENOUS | Status: AC
  Filled 2022-08-25: qty 20

## 2022-08-25 MED ORDER — FENTANYL CITRATE (PF) 250 MCG/5ML IJ SOLN
INTRAMUSCULAR | Status: DC | PRN
  Administered 2022-08-25: 25 ug via INTRAVENOUS
  Administered 2022-08-25: 50 ug via INTRAVENOUS
  Administered 2022-08-25 (×2): 25 ug via INTRAVENOUS
  Administered 2022-08-25: 50 ug via INTRAVENOUS
  Administered 2022-08-25: 25 ug via INTRAVENOUS

## 2022-08-25 MED ORDER — HYDROMORPHONE HCL 1 MG/ML IJ SOLN
0.5000 mg | INTRAMUSCULAR | Status: DC | PRN
  Administered 2022-08-27: 1 mg via INTRAVENOUS
  Filled 2022-08-25: qty 1

## 2022-08-25 MED ORDER — POTASSIUM CHLORIDE CRYS ER 20 MEQ PO TBCR
20.0000 meq | EXTENDED_RELEASE_TABLET | Freq: Once | ORAL | Status: AC
  Administered 2022-08-25: 20 meq via ORAL
  Filled 2022-08-25: qty 1

## 2022-08-25 MED ORDER — FENTANYL CITRATE (PF) 100 MCG/2ML IJ SOLN
25.0000 ug | INTRAMUSCULAR | Status: DC | PRN
  Administered 2022-08-25: 50 ug via INTRAVENOUS

## 2022-08-25 MED ORDER — MIDAZOLAM HCL 2 MG/2ML IJ SOLN
INTRAMUSCULAR | Status: DC | PRN
  Administered 2022-08-25: 1 mg via INTRAVENOUS

## 2022-08-25 SURGICAL SUPPLY — 35 items
ARMBAND PINK RESTRICT EXTREMIT (MISCELLANEOUS) ×1 IMPLANT
BAG COUNTER SPONGE SURGICOUNT (BAG) ×1 IMPLANT
BNDG ELASTIC 4X5.8 VLCR STR LF (GAUZE/BANDAGES/DRESSINGS) ×1 IMPLANT
BNDG ELASTIC 6X5.8 VLCR STR LF (GAUZE/BANDAGES/DRESSINGS) IMPLANT
BNDG GAUZE DERMACEA FLUFF 4 (GAUZE/BANDAGES/DRESSINGS) IMPLANT
CANISTER SUCT 3000ML PPV (MISCELLANEOUS) ×1 IMPLANT
CLIP TI MEDIUM 6 (CLIP) ×1 IMPLANT
COVER PROBE W GEL 5X96 (DRAPES) IMPLANT
DERMABOND ADVANCED .7 DNX12 (GAUZE/BANDAGES/DRESSINGS) ×1 IMPLANT
DRAIN CHANNEL 15F RND FF W/TCR (WOUND CARE) IMPLANT
DRAIN PENROSE .5X12 LATEX STL (DRAIN) IMPLANT
DRSG XEROFORM 1X8 (GAUZE/BANDAGES/DRESSINGS) IMPLANT
ELECT REM PT RETURN 9FT ADLT (ELECTROSURGICAL) ×1
ELECTRODE REM PT RTRN 9FT ADLT (ELECTROSURGICAL) ×1 IMPLANT
GAUZE SPONGE 4X4 12PLY STRL (GAUZE/BANDAGES/DRESSINGS) IMPLANT
GLOVE BIOGEL PI IND STRL 8 (GLOVE) ×1 IMPLANT
GOWN STRL REUS W/ TWL LRG LVL3 (GOWN DISPOSABLE) ×2 IMPLANT
GOWN STRL REUS W/TWL 2XL LVL3 (GOWN DISPOSABLE) ×2 IMPLANT
GOWN STRL REUS W/TWL LRG LVL3 (GOWN DISPOSABLE) ×2
KIT BASIN OR (CUSTOM PROCEDURE TRAY) ×1 IMPLANT
KIT TURNOVER KIT B (KITS) ×1 IMPLANT
NS IRRIG 1000ML POUR BTL (IV SOLUTION) ×1 IMPLANT
PACK CV ACCESS (CUSTOM PROCEDURE TRAY) ×1 IMPLANT
PAD ABD 8X10 STRL (GAUZE/BANDAGES/DRESSINGS) IMPLANT
PAD ARMBOARD 7.5X6 YLW CONV (MISCELLANEOUS) ×2 IMPLANT
SPIKE FLUID TRANSFER (MISCELLANEOUS) ×1 IMPLANT
SUT MNCRL AB 4-0 PS2 18 (SUTURE) ×1 IMPLANT
SUT PROLENE 6 0 BV (SUTURE) IMPLANT
SUT PROLENE 7 0 BV 1 (SUTURE) ×1 IMPLANT
SUT SILK 3 0 SH CR/8 (SUTURE) ×1 IMPLANT
SUT VIC AB 3-0 SH 27 (SUTURE) ×2
SUT VIC AB 3-0 SH 27X BRD (SUTURE) ×1 IMPLANT
TOWEL GREEN STERILE (TOWEL DISPOSABLE) ×1 IMPLANT
UNDERPAD 30X36 HEAVY ABSORB (UNDERPADS AND DIAPERS) ×1 IMPLANT
WATER STERILE IRR 1000ML POUR (IV SOLUTION) ×1 IMPLANT

## 2022-08-25 NOTE — Progress Notes (Signed)
No date present on dressing, we' ll change dressing, red kit ordered.~

## 2022-08-25 NOTE — Significant Event (Signed)
Rapid Response Event Note   Reason for Call :  Chest pain  Initial Focused Assessment:  Sitting up in bed, mild diaphoresis noted on patient's face. Skin otherwise warm/dry. Lungs clear/diminished. Endorses some SOB. C/O chest pain stating "dull, tightness/squeezing" medial and not radiating.   Patient had 11 beat run of vtach prior to this episode.  VS 202/113 (135) HR 91 RR 14 O2 98% RA  Interventions:  Tx already stopped 2L [place] placed EKG Labs; troponin, H&H CXR  162/104 (122) HR 92 O2 100 RR 12  Plan of Care:  Hold off on PRN hydral at this time as BP trending down without medication.  4E RN to report to night TRH team results of labs/CXR.  Call back for further needs  Event Summary:  MD Notified: A. Hongalgi MD, V[facility] Cagey Call Time: [place] Time: [id number] End Time: Tressia Miners, RN

## 2022-08-25 NOTE — Progress Notes (Addendum)
PROGRESS NOTE   Phillip Lynch  [id number]    DOB: 11-09-1965    DOA: 08/24/2022  PCP: Patient, [name]   I have briefly reviewed patients [facility].  Chief Complaint  Patient presents with   Arm Pain    Brief Narrative:  57 year old male with medical history significant for ESRD on hemodialysis, chronic HFpEF, HTN, presented to the ED on 08/24/2022 with complaints of left arm swelling and pain.  Admitted for symptomatic pseudoaneurysm of left arm brachiobasilic AVF due to traumatic cannulation.  Vascular surgery consulted and s/p revision of LUE AV fistula on 3/11.  Nephrology consulted for HD needs via right TDC.   Assessment & Plan:  Principal Problem:   Pseudoaneurysm ([place]) Active Problems:   End stage renal disease on dialysis ([place])   Anemia of chronic kidney failure, stage 5 (HCC)   Pseudoaneurysm of AV hemodialysis fistula (HCC)   Symptomatic pseudoaneurysm of left arm brachiobasilic AVF due to traumatic cannulation: S/p left brachiobasilic fistula [id number].  Cleared to start using the fistula a week PTA.  First dialysis was unremarkable.  On 08/20/2022, he had traumatic cannulation of his access and after removing the needles, he had severe pain and swelling in his arm.  Failed conservative therapy with compression, heat pack, continued worsening of pain and presented to the ED.  Vascular surgery consulted and s/p revision of LUE AV fistula on 3/11.  ESRD on MWF HD:  Nephrology consulted.  Planned HD today through right TDC.   Essential hypertension: Uncontrolled despite carvedilol 6.25 Mg twice daily.  Resume home dose of amlodipine 5 Mg daily & Cozaar 25 Mg daily.  Anemia in ESRD: Hemoglobin has dropped from 9.7-7.8.?  Related to pseudoaneurysm.  Follow CBC in AM.  Transfuse if hemoglobin 7 g or less.  Nephrology planning ESA at dialysis.  Thrombocytopenia: Appears chronic and stable.  Metabolic bone disease: Management per  nephrology  Body mass index is 21.41 kg/m.   DVT prophylaxis: SCDs Start: 08/24/22 1516     Code Status: Full Code:  ACP Documents: None present Family Communication: None at bedside. Disposition:  Status is: Observation The patient will require care spanning > 2 midnights and should be moved to inpatient because: Postop still has a drain in the left upper extremity surgical site which will need to be addressed by vascular surgery.  Also still needs dialysis.  Has significant hemoglobin drop.     Consultants:   Vascular surgery Nephrology  Procedures:   As above  Antimicrobials:      Subjective:  Seen postprocedure late this morning.  Some burning sensation in the left upper extremity.  No tingling, numbness or weakness.  Objective:   Vitals:   08/25/22 0801 08/25/22 1053 08/25/22 1108 08/25/22 1123  BP: (!) 176/104 (!) 172/105 (!) 172/95 (!) 151/95  Pulse: 80 77 67 72  Resp: '15 15 10 12  '$ Temp:  97.7 F (36.5 C)  97.7 F (36.5 C)  TempSrc: Oral     SpO2: 99% 99% 100% 95%  Weight: 65.8 kg     Height: '5\' 9"'$  (1.753 m)       General exam: Young male, moderately built and nourished lying comfortably propped up in bed without distress. Respiratory system: Clear to auscultation. Respiratory effort normal.  Right TDC. Cardiovascular system: S1 & S2 heard, RRR. No JVD, murmurs, rubs, gallops or clicks. No pedal edema.  Telemetry personally reviewed: Sinus rhythm. Gastrointestinal system: Abdomen is nondistended, soft and nontender.  No organomegaly or masses felt. Normal bowel sounds heard. Central nervous system: Alert and oriented. No focal neurological deficits. Extremities: Symmetric 5 x 5 power.  Left upper extremity wrapped in postop Ace wrap.  Has drain. Skin: No rashes, lesions or ulcers Psychiatry: Judgement and insight appear normal. Mood & affect appropriate.     Data Reviewed:   I have personally reviewed following labs and imaging studies   CBC: Recent  Labs  Lab 09/13/22 1159 08/25/22 0104  WBC 6.7 5.5  NEUTROABS 5.0  --   HGB 9.7* 7.8*  HCT 29.3* 23.6*  MCV 102.1* 101.7*  PLT 127* 104*    Basic Metabolic Panel: Recent Labs  Lab 09-13-22 1159 08/25/22 0104  NA 142 137  K 4.2 3.6  CL 98 98  CO2 30 28  GLUCOSE 89 93  BUN 52* 62*  CREATININE 12.67* 14.59*  CALCIUM 9.3 8.6*    Liver Function Tests: No results for input(s): "AST", "ALT", "ALKPHOS", "BILITOT", "PROT", "ALBUMIN" in the last 168 hours.  CBG: No results for input(s): "GLUCAP" in the last 168 hours.  Microbiology Studies:  No results found for this or any previous visit (from the past 240 hour(s)).  Radiology Studies:  VAS US DUPLEX DIALYSIS ACCESS (AVF, AVG)  Result Date: September 13, 2022 DIALYSIS ACCESS Patient Name:  Phillip Lynch  Date of Exam:   09/13/2022 Medical Rec #: [id number]      Accession #:    [id number] Date of Birth: 1966/06/10      Patient Gender: M Patient Age:   76 years Exam Location:  [facility] Procedure:      VAS US DUPLEX DIALYSIS ACCESS (AVF, AVG) Referring Phys: Marda Stalker --------------------------------------------------------------------------------  Reason for Exam: Swelling and pain status post dialysis 08/20/22. Access Site: Left Upper Extremity. Access Type: Basilic vein transposition. History: Patient had left basilic vein transposition 05/20/22. He began using          fistula last week on 08/18/22. After second session 3/6, he felt sharp          pain when needle was removed and has had progressive swelling and pain          since. Received dialysis Friday via [facility]. Limitations: Patient in severe pain with minimal touch Comparison Study: Prior duplex of dialysis access done 07/11/22 Performing Technologist: Sharion Dove RVS  Examination Guidelines: A complete evaluation includes B-mode imaging, spectral Doppler, color Doppler, and power Doppler as needed of all accessible portions of each vessel. Unilateral testing is considered  an integral part of a complete examination. Limited examinations for reoccurring indications may be performed as noted.  Findings:    Summary: There is a pseudoaneurysm noted off the basilic transposition AVF in the proximal upper medial arm. The pseudoaneurysm measures 1.24 cm X 2.16 cm. The neck measures 0.513 cm in width and 1.24 cm in length.  *See table(s) above for measurements and observations.  Diagnosing physician: Jamelle Haring Electronically signed by Jamelle Haring on 09/13/2022 at 4:34:12 PM.   --------------------------------------------------------------------------------   Final     Scheduled Meds:    calcitRIOL  1 mcg Oral Q M,W,F-HD   carvedilol  6.25 mg Oral BID   Chlorhexidine Gluconate Cloth  6 each Topical Q0600   darbepoetin (ARANESP) injection - DIALYSIS  200 mcg Subcutaneous Q Mon-1800   fentaNYL       multivitamin  1 tablet Oral QHS   sucroferric oxyhydroxide  500 mg Oral TID WC    Continuous Infusions:  LOS: 0 days     Vernell Leep, MD,  FACP, [facility], [facility], [facility], [place]     To contact the attending provider between 7A-7P or the covering provider during after hours 7P-7A, please log into the web site [email or web address] and access using universal [place] password for that web site. If you do not have the password, please call the hospital operator.  08/25/2022, 12:25 PM

## 2022-08-25 NOTE — Progress Notes (Signed)
Addendum:  Messaged by RRT that patient was having chest pain at dialysis.  Immediately proceeded to see him.  Per report, patient had completed approximately 2 hours HD, 2 L fluid removed, and at approximately 6 PM, started noticing that his central chest felt like it was tightening up.  Denies "real chest pain".  This was associated with transient mild dyspnea and diaphoresis.  No radiation of pain.  Dialysis was stopped.  Initially had significant hypotension with SBP >200.  HD RN also reported a nonsustained VT earlier.  By the time I arrived at patient's bedside, he reported that his chest pain had resolved.  No further dyspnea or any other complaints.  BP on bedside monitor in the 160-100 range.  Other vital signs stable. Appeared slightly anxious but in no obvious distress. RS: Clear to auscultation.  No increased work of breathing.   CVS: S1 and S2 heard, RRR.  No JVD, murmurs or pedal edema.  Telemetry personally reviewed: Sinus rhythm throughout his dialysis.  Had 11 beat NSVT at around 5:43 PM.  His symptoms did not coincide with this. Extremities: Drain from left upper extremity surgical site with small amount of blood in it.  EKG personally reviewed: Sinus rhythm, no acute changes, suspect LVH, QTc 491 ms.  Assessment and plan:  Chest pain:?  Related to dialysis.  However he has been on dialysis before with no similar symptoms.  HD was appropriately stopped.  EKG without acute changes.  Trend HS Troponin x 2.  Although low index of suspicion, check chest x-ray.  Update 2D echo (2D echo in June 2023 had LVEF of 30-35%).  Did not want to bottom out his blood pressure and with just monitoring, his BP has gradually come down.  Continue monitoring on telemetry.  Already on antihypertensive meds including PRNs.  Sublingual NTG as needed.  Repeat hemoglobin is better than earlier at 8.5 from 7.8.  Per RRT, vascular surgery has come by and seen the patient.  NSVT: Continue telemetry.  Repeat  2D echo.  Will be difficult to keep potassium >4 and magnesium >2 in this dialysis patient.  Check magnesium.  Consider consulting cardiology in AM.  Signed out to [facility] floor coverage.  Vernell Leep, MD,  FACP, [place], [facility], [facility], [facility]   [facility] Physician Advisor [place]     To contact the attending provider between 7A-7P or the covering provider during after hours 7P-7A, please log into the web site [email or web address] and access using universal [place] password for that web site. If you do not have the password, please call the hospital operator.

## 2022-08-25 NOTE — Procedures (Signed)
HD Note:  Some information was entered later than the data was gathered due to patient care needs. The stated time with the data is accurate.  Received patient in bed to unit.  Alert and oriented.  Informed consent signed and in chart.   TX duration: 3.5 hours  Patient Arterial pressures rose, lines were switched and flushed, but at 1529, air in the line alert caused the machine to stop treatment.  Machine was reset up. Patient was unable to have his rinse back done.  This is approximately 300 ml of fluid.  Patient experience a sudden Vtach episode with diaphoresis and chest tightness at the  2 hour and 10 min point of treatment. BP elevated significantly, see flowsheet.  Patient was rinsed back,   Dr. Carolin Sicks was notified and agreed to the decision.  Rapid Response was called, see note.  New orders written.  Patient had EKG, new labs, O2 at 2L/M started. Patient was dried and the diaphoresis resolved prior to leaving the unit.  Transported back to the room by this staff member. Alert, with statement made by patient that the symptoms were lessening. Hand-off given to patient's nurse on phone prior to taking the patient back to his room.  Turned patient over to his nurse at the patient's room  Access used: Right upper chest HD Catheter Access issues: See above.  Total UF removed: 2000 ml      [facility]

## 2022-08-25 NOTE — Transfer of Care (Signed)
Immediate Anesthesia Transfer of Care Note  Patient: Alphonsus Brandell  Procedure(s) Performed: EVACUATION HEMATOMA WITH PRIMARY REPAIR OF LEFT UPPER ARM ARTERIOVENOUS FISTULA (Left)  Patient Location: PACU  Anesthesia Type:General  Level of Consciousness: awake, alert , drowsy, and patient cooperative  Airway & Oxygen Therapy: Patient Spontanous Breathing and Patient connected to nasal cannula oxygen  Post-op Assessment: Report given to RN, Post -op Vital signs reviewed and stable, and Patient moving all extremities X 4  Post vital signs: Reviewed and stable  Last Vitals:  Vitals Value Taken Time  BP 172/105 08/25/22 1053  Temp    Pulse 76 08/25/22 1056  Resp 19 08/25/22 1056  SpO2 99 % 08/25/22 1056  Vitals shown include unvalidated device data.  Last Pain:  Vitals:   08/25/22 0801  TempSrc: Oral  PainSc:          Complications: No notable events documented.

## 2022-08-25 NOTE — Progress Notes (Signed)
Pt receives out-pt HD at [facility] on MWF. Will assist as needed.   Angelos Wasco Renal Navigator [phone] 

## 2022-08-25 NOTE — Progress Notes (Signed)
[facility] Progress Note  Name: Li Aloi MRN: [id number] DOB: 03/19/1966  Chief Complaint:  Arm pain   Subjective:  Taken to OR this AM for repair of pseudoaneurysm - op note pending.  He is back on the floor.  He states pain was pretty intense yesterday but has gotten a little better  Review of systems:  Denies shortness of breath or chest pain  Some nausea today without vomiting   ---------------- Background on consult:  HPI: The patient is a 57 y.o. year-old w/ PMH as below who presented to ED earlier today for new onset pain of his HD access/ fistula. Pt seen in ED by VVS who noted L arm was swollen, he had the new BBF done 05/20/22. Pseuoaneurysm was noted and  plan is for OR repair tomorrow. We are asked to see for dialysis.    Intake/Output Summary (Last 24 hours) at 08/25/2022 1205 Last data filed at 08/25/2022 1036 Gross per 24 hour  Intake --  Output 100 ml  Net -100 ml    Vitals:  Vitals:   08/25/22 0801 08/25/22 1053 08/25/22 1108 08/25/22 1123  BP: (!) 176/104 (!) 172/105 (!) 172/95 (!) 151/95  Pulse: 80 77 67 72  Resp: '15 15 10 12  '$ Temp:  97.7 F (36.5 C)  97.7 F (36.5 C)  TempSrc: Oral     SpO2: 99% 99% 100% 95%  Weight: 65.8 kg     Height: '5\' 9"'$  (1.753 m)        Physical Exam:  General adult male in bed in discomfort post-op HEENT normocephalic atraumatic extraocular movements intact sclera anicteric Neck supple trachea midline Lungs clear to auscultation bilaterally normal work of breathing at rest on room air  Heart S1S2 no rub Abdomen soft nontender nondistended Extremities no edema  Psych normal mood and affect Neuro alert and oriented x 3 provides hx and follows commands Access RIJ tunn catheter in use; left AVF with bruit - wrapped and with drain in place  Medications reviewed   Labs:     Latest Ref Rng & Units 08/25/2022    1:04 AM 08/24/2022   11:59 AM 05/22/2022    3:06 AM  BMP  Glucose 70 - 99 mg/dL 93  89  100    BUN 6 - 20 mg/dL 62  52  50   Creatinine 0.61 - 1.24 mg/dL 14.59  12.67  14.11   Sodium 135 - 145 mmol/L 137  142  132   Potassium 3.5 - 5.1 mmol/L 3.6  4.2  4.0   Chloride 98 - 111 mmol/L 98  98  92   CO2 22 - 32 mmol/L '28  30  25   '$ Calcium 8.9 - 10.3 mg/dL 8.6  9.3  7.6      Assessment/Plan:   L arm pain - per VVS due to pseudoaneurysm of L arm AVF. Fistula was just accessed recently; created in dec 2023. ESRD - on HD MWF. Has not missed HD, using TDC.  Will schedule HD around surgery HTN - optimize volume with HD; improved some with pain control as well  Anemia esrd as well as acute blood loss with the pseudoaneurysm.  Last esa in OP unit was in January.  He was due for high dose esa to start next HD.  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 Renal panel in AM    Disposition per  primary team.  Noted he is obs currently   Claudia Desanctis, MD 08/25/2022 12:27 PM

## 2022-08-25 NOTE — Anesthesia Preprocedure Evaluation (Signed)
Anesthesia Evaluation  Patient identified by MRN, date of birth, ID band Patient awake    Reviewed: Allergy & Precautions, H&P , NPO status , Patient's Chart, lab work & pertinent test results  Airway Mallampati: II   Neck ROM: full    Dental   Pulmonary former smoker   breath sounds clear to auscultation       Cardiovascular hypertension, +CHF   Rhythm:regular Rate:Normal  Cath (02/2022): EF 45-50%, normal coronaries.   Neuro/Psych    GI/Hepatic   Endo/Other    Renal/GU ESRF and DialysisRenal disease     Musculoskeletal   Abdominal   Peds  Hematology  (+) Blood dyscrasia, anemia Hemoglobin 7.8   Anesthesia Other Findings   Reproductive/Obstetrics                             Anesthesia Physical Anesthesia Plan  ASA: 3  Anesthesia Plan: General   Post-op Pain Management:    Induction: Intravenous  PONV Risk Score and Plan: 2 and Ondansetron, Dexamethasone, Midazolam and Treatment may vary due to age or medical condition  Airway Management Planned: LMA  Additional Equipment:   Intra-op Plan:   Post-operative Plan: Extubation in OR  Informed Consent: I have reviewed the patients History and Physical, chart, labs and discussed the procedure including the risks, benefits and alternatives for the proposed anesthesia with the patient or authorized representative who has indicated his/her understanding and acceptance.     Dental advisory given  Plan Discussed with: CRNA, Anesthesiologist and Surgeon  Anesthesia Plan Comments:        Anesthesia Quick Evaluation

## 2022-08-25 NOTE — Op Note (Signed)
    NAME: Phillip Lynch    MRN: [id number] DOB: 04-21-1966    DATE OF OPERATION: 08/25/2022  PREOP DIAGNOSIS:    Left arm fistula malfunction  POSTOP DIAGNOSIS:    Same  PROCEDURE:    Left arm incision hematoma evacuation Fistula revision-primary repair of left-sided brachiobasilic fistula 14 French Blake drain placement   SURGEON: Broadus John  ASSIST: [name], PA  ANESTHESIA: General  EBL: 70ml  INDICATIONS:    Phillip Lynch is a 57 y.o. male who presented to the hospital after fistula infiltration resulting in a large hematoma in the left upper extremity.  The pain from the hematoma has limited his ability to work.  He denies sensorimotor deficits in the hand, has a thrill within the fistula.  After discussing risk and benefits of hematoma evacuation, possible left arm fistula revision, Phillip Lynch elected to proceed.  FINDINGS:   Large intramuscular hematoma affecting the bicep brachii muscle  TECHNIQUE:   Patient was brought to the OR laid in the supine position.  General anesthesia was induced and the patient was prepped and draped in standard fashion.  The case began with ultrasound insonation of the left brachiobasilic fistula.  This was marked.  I elected to make a longitudinal incision in the upper arm.  The fistula was localized, and there was significant oozing appreciated, therefore a tourniquet was placed in the arm exsanguinated using an Esmarch wrap.  Next, the fascia underneath was opened and a large, pressurized hematoma was evacuated.  The hematoma extended along the length of the muscle.  I was unable to remove the large portion as it was within the striations of the muscle itself.  The muscle appeared unhealthy, but was reactive to cautery.  Next, copious amounts of irrigation were used.  The wound bed.  Tourniquet was removed.  A small venotomy was appreciated which was primarily using 6-0 Prolene suture.  Next, a 9 [place] Blake drain was  placed in the wound bed, and the skin closed using interrupted 3-0 Vicryl suture with staples at the skin.  Impression: Successful hematoma evacuation, primary repair of left-sided brachiobasilic fistula.  Plan will be for 1 month fistula rest prior to attempting reaccess pending clinical improvement.  Phillip Burows, MD [facility] DATE OF DICTATION:   08/25/2022

## 2022-08-25 NOTE — Anesthesia Procedure Notes (Signed)
Procedure Name: LMA Insertion Date/Time: 08/25/2022 9:42 AM  Performed by: Gaylene Brooks, CRNAPre-anesthesia Checklist: Patient identified, Emergency Drugs available, Suction available and Patient being monitored Patient Re-evaluated:Patient Re-evaluated prior to induction Oxygen Delivery Method: Circle System Utilized Preoxygenation: Pre-oxygenation with 100% oxygen Induction Type: IV induction Ventilation: Mask ventilation without difficulty LMA: LMA inserted LMA Size: 4.0 Number of attempts: 1 Airway Equipment and Method: Bite block Placement Confirmation: positive ETCO2 Tube secured with: Tape Dental Injury: Teeth and Oropharynx as per pre-operative assessment

## 2022-08-25 NOTE — Progress Notes (Addendum)
[facility] [place]  Subjective  - Pain in the left UE with edema, no change from yesterday per patient.   Objective (!) 140/84 72 98.9 F (37.2 C) (Oral) 12 99% No intake or output data in the 24 hours ending 08/25/22 0731  Left UE N/M/V intact Left UE edema without change Lungs non labored breathing  Assessment/Planning: Traumatic cannulation Left UE av fistula  NPO for OR today I & D with possible revision of left UE av fitula  Patient agrees to proceed.  [name] 08/25/2022 7:31 AM --  VASCULAR STAFF ADDENDUM: I have independently interviewed and examined the patient. I agree with the above.  Left upper arm with significant hematoma.  Pt in severe pain.  No sensory loss or motor loss in the hand, but can not work due to the pain.  Fistula with thrill.  We discussed the risks and benefits of hematoma evacuation, possible fistula revision. He is aware there are several risks including fistula loss and infection. After discussing the risks and benefits, Phillip Lynch elected to proceed.    Cassandria Santee, MD [facility] Phone Number: [phone] 08/25/2022 9:27 AM    Laboratory Lab Results: Recent Labs    08/24/22 1159 08/25/22 0104  WBC 6.7 5.5  HGB 9.7* 7.8*  HCT 29.3* 23.6*  PLT 127* 104*   BMET Recent Labs    08/24/22 1159 08/25/22 0104  NA 142 137  K 4.2 3.6  CL 98 98  CO2 30 28  GLUCOSE 89 93  BUN 52* 62*  CREATININE 12.67* 14.59*  CALCIUM 9.3 8.6*    COAG Lab Results  Component Value Date   INR 1.2 05/18/2022   No results found for: "PTT"

## 2022-08-26 ENCOUNTER — Other Ambulatory Visit: Payer: Self-pay

## 2022-08-26 ENCOUNTER — Encounter (HOSPITAL_COMMUNITY): Payer: Self-pay | Admitting: Vascular Surgery

## 2022-08-26 ENCOUNTER — Inpatient Hospital Stay (HOSPITAL_COMMUNITY): Payer: 59

## 2022-08-26 DIAGNOSIS — R9431 Abnormal electrocardiogram [ECG] [EKG]: Secondary | ICD-10-CM | POA: Insufficient documentation

## 2022-08-26 DIAGNOSIS — R079 Chest pain, unspecified: Secondary | ICD-10-CM

## 2022-08-26 DIAGNOSIS — I4729 Other ventricular tachycardia: Secondary | ICD-10-CM

## 2022-08-26 LAB — CBC
HCT: 23.3 % — ABNORMAL LOW (ref 39.0–52.0)
Hemoglobin: 7.5 g/dL — ABNORMAL LOW (ref 13.0–17.0)
MCH: 32.9 pg (ref 26.0–34.0)
MCHC: 32.2 g/dL (ref 30.0–36.0)
MCV: 102.2 fL — ABNORMAL HIGH (ref 80.0–100.0)
Platelets: 112 10*3/uL — ABNORMAL LOW (ref 150–400)
RBC: 2.28 MIL/uL — ABNORMAL LOW (ref 4.22–5.81)
RDW: 14.2 % (ref 11.5–15.5)
WBC: 3.7 10*3/uL — ABNORMAL LOW (ref 4.0–10.5)
nRBC: 0 % (ref 0.0–0.2)

## 2022-08-26 LAB — ECHOCARDIOGRAM COMPLETE
Area-P 1/2: 6.27 cm2
Height: 69 in
S' Lateral: 4.5 cm
Weight: 2380.97 oz

## 2022-08-26 LAB — HEPATITIS B SURFACE ANTIBODY, QUANTITATIVE: Hep B S AB Quant (Post): >1000 m[IU]/mL (ref >9.9)

## 2022-08-26 LAB — RENAL FUNCTION PANEL
Albumin: 3.2 g/dL — ABNORMAL LOW (ref 3.5–5.0)
Anion gap: 10 (ref 5–15)
BUN: 56 mg/dL — ABNORMAL HIGH (ref 6–20)
CO2: 29 mmol/L (ref 22–32)
Calcium: 8.1 mg/dL — ABNORMAL LOW (ref 8.9–10.3)
Chloride: 97 mmol/L — ABNORMAL LOW (ref 98–111)
Creatinine, Ser: 12.01 mg/dL — ABNORMAL HIGH (ref 0.61–1.24)
GFR, Estimated: 4 mL/min — ABNORMAL LOW (ref >60)
Glucose, Bld: 147 mg/dL — ABNORMAL HIGH (ref 70–99)
Phosphorus: 4.7 mg/dL — ABNORMAL HIGH (ref 2.5–4.6)
Potassium: 4.9 mmol/L (ref 3.5–5.1)
Sodium: 136 mmol/L (ref 135–145)

## 2022-08-26 LAB — GLUCOSE, CAPILLARY: Glucose-Capillary: 112 mg/dL — ABNORMAL HIGH (ref 70–99)

## 2022-08-26 MED ORDER — DARBEPOETIN ALFA 200 MCG/0.4ML IJ SOSY
200.0000 ug | PREFILLED_SYRINGE | INTRAMUSCULAR | Status: DC
  Administered 2022-08-26: 200 ug via SUBCUTANEOUS
  Filled 2022-08-26: qty 0.4

## 2022-08-26 MED ORDER — CHLORHEXIDINE GLUCONATE CLOTH 2 % EX PADS
6.0000 | MEDICATED_PAD | Freq: Every day | CUTANEOUS | Status: DC
  Administered 2022-08-27: 6 via TOPICAL

## 2022-08-26 NOTE — Progress Notes (Signed)
[facility] Progress Note  Name: Phillip Lynch MRN: [id number] DOB: 1966/05/23  Chief Complaint:  Arm pain   Subjective:  Last HD on 3/11 with 2 kg UF.  S/p hematoma evacuation and fistula revision and repair on 3/11 with vascular.  He states arm pain has gone from a 10/10 to a 5/10.  He had shortness of breath during HD yesterday as below.  Has never had trouble before like that.    Review of systems:   He had shortness of breath yesterday during HD - now has resolved but he left his oxygen on overnight.  He had chest discomfort yesterday during HD which had resolved on re-evaluation with his team  Denies nausea or vomiting   ---------------- Background on consult:  HPI: The patient is a 57 y.o. year-old w/ PMH as below who presented to ED earlier today for new onset pain of his HD access/ fistula. Pt seen in ED by [facility]VS who noted L arm was swollen, he had the new BBF done 05/20/22. Pseuoaneurysm was noted and  plan is for OR repair tomorrow. We are asked to see for dialysis.    Intake/Output Summary (Last 24 hours) at 08/26/2022 0751 Last data filed at 08/25/2022 2230 Gross per 24 hour  Intake 240 ml  Output 2120 ml  Net -1880 ml    Vitals:  Vitals:   08/25/22 1825 08/25/22 1900 08/25/22 2305 08/26/22 0302  BP: (!) 164/105 (!) 146/100 137/87 114/77  Pulse:  92 82 79  Resp:  '20 16 10  '$ Temp:   98 F (36.7 C) 98 F (36.7 C)  TempSrc:  Oral Oral Oral  SpO2: 100% 99% 97% 100%  Weight:      Height:         Physical Exam:   General adult male in bed in discomfort post-op HEENT normocephalic atraumatic extraocular movements intact sclera anicteric Neck supple trachea midline Lungs clear to auscultation bilaterally normal work of breathing at rest on 2 liters oxygen at 100%  Heart S1S2 no rub Abdomen soft nontender nondistended Extremities no edema  Psych normal mood and affect Neuro alert and oriented x 3 provides hx and follows commands Access [email or web address]  catheter; left AVF with bruit - wrapped and with drain in place  Medications reviewed   Labs:     Latest Ref Rng & Units 08/26/2022    1:01 AM 08/25/2022    1:04 AM 08/24/2022   11:59 AM  BMP  Glucose 70 - 99 mg/dL 147  93  89   BUN 6 - 20 mg/dL 56  62  52   Creatinine 0.61 - 1.24 mg/dL 12.01  14.59  12.67   Sodium 135 - 145 mmol/L 136  137  142   Potassium 3.5 - 5.1 mmol/L 4.9  3.6  4.2   Chloride 98 - 111 mmol/L 97  98  98   CO2 22 - 32 mmol/L '29  28  30   '$ Calcium 8.9 - 10.3 mg/dL 8.1  8.6  9.3     OP HD: MWF G-O  4h  400/800   64.5kg  2/2.5 bath  TDC   Hep 6500 - last HD 3/08, post wt 64.8kg - rocaltrol 1.0 mcg po tiw - mircera 200 mcg IV q 2 wks, has not started yet   Assessment/Plan:   L arm pain - due to pseudoaneurysm and hematoma of L arm AVF. Fistula was just accessed recently; created in dec 2023.  S/p hematoma evacuation and  fistula revision and repair on 3/11 with Dr. Virl Cagey.  ESRD - on HD MWF.  Using tunneled catheter.  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  Last esa in OP unit was in January.  He was due for high dose esa to start next HD.  He was ordered for aranesp 200 mcg weekly on Mondays while here and dose was not given.  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  Disposition per primary team.    Claudia Desanctis, MD 08/26/2022 8:33 AM

## 2022-08-26 NOTE — Consult Note (Addendum)
CARDIOLOGY CONSULT NOTE  Patient ID: Phillip Lynch MRN: [id number] DOB/AGE: December 30, 1965 57 y.o.  Admit date: 08/24/2022 Referring Physician: [facility] hospitalist Reason for Consultation:  Chest pain  HPI:   57 y/o [place] American male with hypertension, ESRD on hemodialysis, HFrEF, h/o polysubstance abuse, s/p  hematoma evacuation and fistula revision and repair on 08/25/2022. Cardiology consulted for chest pain.  Patient underwent surgery on 3/11, was undergoing dialysis on 3/11 evening, when he had central chest pain, associated with mild dyspnea and diaphoresis.  Chest pain lasted only for few seconds.  He was hypertensive with SBP >200 mmHg at that time, Dialysis was stopped. EKG, trop HS were negative for ischemia.  He has not had any chest pain since then.  At home, his blood pressure has been elevated recently.  He was taking carvedilol 12.5 mg once a day, as opposed to 6.25 mg twice daily.  Past Medical History:  Diagnosis Date   CHF (congestive heart failure) (HCC)    Congenital renal agenesis, unilateral    Hypertension    Unintentional weight loss      Past Surgical History:  Procedure Laterality Date   AV FISTULA PLACEMENT Left 05/13/2021   Procedure: ARTERIOVENOUS (AV) FISTULA CREATION;  Surgeon: Broadus John, MD;  Location: [place];  Service: Vascular;  Laterality: Left;   AV FISTULA PLACEMENT Left 05/20/2022   Procedure: LEFT ARM BASILIC VEIN TRANSPOSITION;  Surgeon: Angelia Mould, MD;  Location: [place];  Service: Vascular;  Laterality: Left;   CAPD INSERTION N/A 04/10/2022   Procedure: LAPAROSCOPIC INSERTION CONTINUOUS AMBULATORY PERITONEAL DIALYSIS  (CAPD) CATHETER WITH OMENTOPEXY;  Surgeon: Cherre Robins, MD;  Location: [place] [facility];  Service: Vascular;  Laterality: N/A;   CAPD INSERTION  05/20/2022   Procedure: PERITONEAL DIALYSIS CATHETER REMOVAL;  Surgeon: Angelia Mould, MD;  Location: [place];  Service: Vascular;;   IR FLUORO GUIDE CV LINE RIGHT   07/15/2021   IR US GUIDE VASC ACCESS RIGHT  07/16/2021   RIGHT/LEFT HEART CATH AND CORONARY ANGIOGRAPHY N/A 02/18/2022   Procedure: RIGHT/LEFT HEART CATH AND CORONARY ANGIOGRAPHY;  Surgeon: Adrian Prows, MD;  Location: [place] [facility];  Service: Cardiovascular;  Laterality: N/A;      Family History  Problem Relation Age of Onset   Stroke Mother    Dementia Mother    Kidney disease Mother      Social History: Social History   Socioeconomic History   Marital status: Divorced    Spouse name: Not on file   Number of children: 1   Years of education: Not on file   Highest education level: Not on file  Occupational History   Occupation: Landscaper  Tobacco Use   Smoking status: Former    Types: Cigars    Quit date: 04/2021    Years since quitting: 1.3   Smokeless tobacco: Never  Vaping Use   Vaping Use: Never used  Substance and Sexual Activity   Alcohol use: Yes    Comment: occ   Drug use: Not Currently    Types: Marijuana   Sexual activity: Yes  Other Topics Concern   Not on file  Social History Narrative   Works as a Development worker, 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 His father passed in March and his mother moved into a nursing facility, and unfortunately he lost ownership of his family's house so has been experiencing housing instability since early summer. Occasionally resides with friend, also gets services from [facility]. Has slept outside as well.    Social Determinants  of Health   Financial Resource Strain: Low Risk  (05/21/2022)   Overall Financial Resource Strain (CARDIA)    Difficulty of Paying Living Expenses: Not very hard  Food Insecurity: No Food Insecurity (08/25/2022)   Hunger Vital Sign    Worried About Running Out of Food in the Last Year: Never true    Ran Out of Food in the Last Year: Never true  Transportation Needs: No Transportation Needs (08/25/2022)   PRAPARE - Hydrologist (Medical): No    Lack of Transportation (Non-Medical): No  Physical  Activity: Not on file  Stress: Not on file  Social Connections: Not on file  Intimate Partner Violence: Not At Risk (08/25/2022)   Humiliation, Afraid, Rape, and Kick questionnaire    Fear of Current or Ex-Partner: No    Emotionally Abused: No    Physically Abused: No    Sexually Abused: No     Medications Prior to Admission  Medication Sig Dispense Refill Last Dose   amLODipine (NORVASC) 5 MG tablet Take 5 mg by mouth daily.   08/24/2022   B Complex-C-Folic Acid (DIALYVITE Q000111Q) 0.8 MG TABS Take 1 tablet by mouth daily.   08/24/2022   carvedilol (COREG) 6.25 MG tablet Take 1 tablet (6.25 mg total) by mouth 2 (two) times daily. (Patient taking differently: Take 12.5 mg by mouth daily.) 60 tablet 0 08/24/2022 at 0800-0900   losartan (COZAAR) 25 MG tablet Take 25 mg by mouth daily.   08/24/2022   sucroferric oxyhydroxide (VELPHORO) 500 MG chewable tablet Chew 500 mg by mouth 3 (three) times daily with meals.   08/24/2022   calcitRIOL (ROCALTROL) 0.5 MCG capsule Take 0.5 mcg by mouth daily.      SENSIPAR 30 MG tablet Take 30 mg by mouth daily with supper. (Patient not taking: Reported on 07/11/2022)   Not Taking    Review of Systems  Cardiovascular:  Positive for chest pain (Now resolved). Negative for dyspnea on exertion, leg swelling, palpitations and syncope.      Physical Exam: Physical Exam Vitals and nursing note reviewed.  Constitutional:      General: He is not in acute distress. Neck:     Vascular: No JVD.  Cardiovascular:     Rate and Rhythm: Normal rate and regular rhythm.     Heart sounds: Normal heart sounds. No murmur heard. Pulmonary:     Effort: Pulmonary effort is normal.     Breath sounds: Normal breath sounds. No wheezing or rales.  Chest:     Comments: Dialysis catheter in right upper chest Musculoskeletal:     Right lower leg: No edema.     Left lower leg: No edema.     Comments: Left arm with recent fistula repair, currently dressed        Imaging/tests  reviewed and independently interpreted: Lab Results: CBC, BMP, trop HS  Cardiac Studies:  Telemetry 08/26/2022: No significant arrhythmia.  Personally reviewed telemetry tracings.  Labeled NSVT episodes are in fact artifact.  No ventricular arrhythmia seen.   EKG 08/26/2022: Normal sinus rhythm Minimal voltage criteria for LVH, may be normal variant ( Cornell product ) Prolonged QT Abnormal ECG  Echocardiogram 08/26/2022:  1. Left ventricular ejection fraction, by estimation, is 45 to 50%. The  left ventricle has mildly decreased function. The left ventricle  demonstrates global hypokinesis. There is moderate concentric left  ventricular hypertrophy of the inferior segment.  Left ventricular diastolic parameters are indeterminate. AMYLI Score  elevated.  2. Right ventricular systolic function is normal. The right ventricular  size is normal.   3. Left atrial size was severely dilated.   4. The mitral valve is normal in structure. Trivial mitral valve  regurgitation. No evidence of mitral stenosis.   5. The aortic valve is tricuspid. Aortic valve regurgitation is trivial.  No aortic stenosis is present.   6. The inferior vena cava is normal in size with greater than 50%  respiratory variability, suggesting right atrial pressure of 3 mmHg.   7. Cannot exclude a small PFO.   Comparison(s): Prior images reviewed side by side. LV function has  improved.   Right & Left Heart Catheterization 02/18/2022:  LV: 147/1, EDP 11 mmHg.  Ao 145/82, mean 112 mmHg.  No pressure gradient across the aortic valve. LVEF 45% with global hypokinesis.  No significant mitral regurgitation. LM: Large-caliber vessel, normal. LAD: Gives origin to large D1 and D2.  The LAD wraps around the apex and supplies most of the posterior wall, type IV LAD.  It is smooth and normal. LCx: Moderate caliber vessel, gives origin to small PDA branch.  2 small marginal branches.  Smooth and normal. RI: Moderate caliber  vessel, smooth and normal.   RA 13/12, mean 12 mmHg RV 29/7, EDP 12 mmHg PA 29/12, mean 24 mmHg. PW 24/21, mean 18 mmHg.  Ao saturation 97%, PA saturation 69%.  DP/+1.00 and CO 5.31, CI 3.01, normal.   Impression: Findings consistent with nonischemic cardiomyopathy with mild LV systolic dysfunction.  Preserved cardiac output and cardiac index without evidence of pulmonary hypertension.  No intracardiac shunting.  35 mill contrast utilized.  Assessment & Recommendations:  57 y/o [place] American male with hypertension, ESRD on hemodialysis, HFrEF, h/o polysubstance abuse, s/p  hematoma evacuation and fistula revision and repair on 08/25/2022. Cardiology consulted for chest pain  Chest pain: Unlikely to be angina.  EKG, Trop HS not suggestive of ischemia. Most likely associated with hypertensive urgency. Blood pressure now significantly improved. Normal coronaries on angiogram 02/2022.  No further ischemic workup necessary at this time.  Hypertension: He may need uptitration of his antihypertensive therapy outpatient, if blood pressure continues to be remain elevated.  Significant improvement in blood pressure since yesterday. Take Coreg 6.25 mg twice daily, in place of 12.5 g daily. Continue amlodipine 5 mg daily, losartan 5 mg daily.  HFrEF: Mildly reduced EF 45 to 50%. Euvolemic at this time. Continue current medical management.  Will sign off.  Please call us back with any questions. Will arrange outpatient follow-up.  Discussed interpretation of tests and management recommendations with the primary team     Nigel Mormon, MD Pager: [phone] Office: [phone]

## 2022-08-26 NOTE — Progress Notes (Signed)
[facility]  Subjective  - States his left arm feels better than prior to the surgery   Objective 114/77 79 98 F (36.7 C) (Oral) 10 100%  Intake/Output Summary (Last 24 hours) at 08/26/2022 0710 Last data filed at 08/25/2022 2230 Gross per 24 hour  Intake 240 ml  Output 2120 ml  Net -1880 ml    Motor intact left fingers. Edema in hand, encouraged movement and elevation Dressing clean and dry Drain maintained Lungs non labored breathing   Assessment/Planning: POD # ESRD with left AV fistula malfunction s/p  Left arm incision hematoma evacuation Fistula revision-primary repair of left-sided brachiobasilic fistula 14 French Blake drain placement  Drain with 20 cc OP Anemia acute on chronic 8.5 to 7.5 Plan to change dressing tomorrow Encouraged movement and elevation   [name] 08/26/2022 7:10 AM --  VASCULAR STAFF ADDENDUM: I have independently interviewed and examined the patient. I agree with the above.  Delayed note - seen yesterday. Will evaluate today and change dressing, possible drain pull.   Cassandria Santee, MD [facility] Phone Number: [phone] 08/27/2022 10:47 AM    Laboratory Lab Results: Recent Labs    08/25/22 0104 08/25/22 1825 08/26/22 0101  WBC 5.5  --  3.7*  HGB 7.8* 8.5* 7.5*  HCT 23.6* 26.4* 23.3*  PLT 104*  --  112*   BMET Recent Labs    08/25/22 0104 08/26/22 0101  NA 137 136  K 3.6 4.9  CL 98 97*  CO2 28 29  GLUCOSE 93 147*  BUN 62* 56*  CREATININE 14.59* 12.01*  CALCIUM 8.6* 8.1*    COAG Lab Results  Component Value Date   INR 1.2 05/18/2022   No results found for: "PTT"

## 2022-08-26 NOTE — Anesthesia Postprocedure Evaluation (Signed)
Anesthesia Post Note  Patient: Phillip Lynch  Procedure(s) Performed: EVACUATION HEMATOMA WITH PRIMARY REPAIR OF LEFT UPPER ARM ARTERIOVENOUS FISTULA (Left)     Patient location during evaluation: PACU Anesthesia Type: General Level of consciousness: awake and alert Pain management: pain level controlled Vital Signs Assessment: post-procedure vital signs reviewed and stable Respiratory status: spontaneous breathing, nonlabored ventilation, respiratory function stable and patient connected to nasal cannula oxygen Cardiovascular status: blood pressure returned to baseline and stable Postop Assessment: no apparent nausea or vomiting Anesthetic complications: no   No notable events documented.  Last Vitals:  Vitals:   08/25/22 2305 08/26/22 0302  BP: 137/87 114/77  Pulse: 82 79  Resp: 16 10  Temp: 36.7 C 36.7 C  SpO2: 97% 100%    Last Pain:  Vitals:   08/26/22 0302  TempSrc: Oral  PainSc:                  [place] S

## 2022-08-26 NOTE — Progress Notes (Signed)
PROGRESS NOTE   Phillip Lynch  [id number]    DOB: 1966/03/03    DOA: 08/24/2022  PCP: Patient, [name]   I have briefly reviewed patients previous medical records in [facility].  Chief Complaint  Patient presents with   Arm Pain    Brief Narrative:  57 year old male with medical history significant for ESRD on hemodialysis, chronic HFpEF, HTN, presented to the ED on 08/24/2022 with complaints of left arm swelling and pain.  Admitted for symptomatic pseudoaneurysm of left arm brachiobasilic AVF due to traumatic cannulation.  Vascular surgery consulted and s/p revision of LUE AV fistula on 3/11.  Nephrology consulted for HD needs via right TDC.  Developed transient chest pain and an episode of NSVT at HD on 3/11.  Cardiology consulted.   Assessment & Plan:  Principal Problem:   Pseudoaneurysm ([place]) Active Problems:   End stage renal disease on dialysis ([place])   Anemia of chronic kidney failure, stage 5 (HCC)   Pseudoaneurysm of AV hemodialysis fistula (HCC)   Symptomatic pseudoaneurysm of left arm brachiobasilic AVF due to traumatic cannulation: S/p left brachiobasilic fistula [id number].  Cleared to start using the fistula a week PTA.  First dialysis was unremarkable.  On 08/20/2022, he had traumatic cannulation of his access and after removing the needles, he had severe pain and swelling in his arm.  Failed conservative therapy with compression, heat pack, continued worsening of pain and presented to the ED.  Vascular surgery consulted and s/p hematoma evacuation, revision and repair of LUE AV fistula on 3/11.  Management per vascular surgery, plan to change dressing tomorrow and have told the patient that he may be able to DC tomorrow.  Still has surgical drain in place.  ESRD on MWF HD:  Nephrology consulted.  Underwent HD on 3/11 for about 2 hours via right TDC.  Dialysis had to be cut short due to chest pain.  Chest pain 3/11: Developed squeezing type of central chest pain  after approximately 2 hours of dialysis and 2 L of volume removal.  Dialysis was stopped.  EKG without acute changes.  HS Troponin x 2 negative.  Chest x-ray without acute findings.  Chest pain and transient dyspnea spontaneously resolved. TTE repeated 3/12: LVEF 45-50%, appears improved compared to prior.  LV demonstrates global hypokinesis.  Moderate LVH.  No aortic stenosis.  [facility] cardiology consulted and will see today.  No chest pain recurrence.  NSVT 3/11: 11 beat NSVT noted while on hemodialysis.  No recurrence on telemetry.  TTE results as above.  Continue carvedilol 6.25 Mg twice daily.  Potassium and magnesium are okay.  Cardiology input pending.  Essential hypertension: Uncontrolled despite carvedilol 6.25 Mg twice daily.  Resumed home dose of amlodipine 5 Mg daily & Cozaar 25 Mg daily.  Controlled.  Was markedly elevated yesterday during episode of chest pain, likely related to chest pain.  Anemia in ESRD/postop acute blood loss anemia: Hemoglobin has dropped from 9.7-7.8. Related to pseudoaneurysm/hematoma.  Nephrology planning ESA at dialysis.  Hemoglobin 7.5 today.  Follow-up in a.m. and transfuse for edema open 7 g or less.  Thrombocytopenia: Appears chronic and stable.  Metabolic bone disease: Management per nephrology  Body mass index is 21.98 kg/m.   DVT prophylaxis: SCDs Start: 08/24/22 1516     Code Status: Full Code:  ACP Documents: None present Family Communication: None at bedside. Disposition:  Inpatient.  Still has left upper extremity drain.  Awaiting cardiology consultation.  Possible DC home tomorrow pending clearance  from vascular surgery, nephrology and cardiology.     Consultants:   Vascular surgery Nephrology  Procedures:   As above  Antimicrobials:      Subjective:  Feels much better this morning.  No recurrence of chest pain or dyspnea.  Mild pain in the left upper extremity.  States that he tries to lead a healthy life, eats healthy,  walks regularly.  Objective:   Vitals:   08/25/22 2305 08/26/22 0302 08/26/22 0807 08/26/22 1339  BP: 137/87 114/77 (!) 153/76 (!) 141/88  Pulse: 82 79 86 85  Resp: '16 10 16 18  '$ Temp: 98 F (36.7 C) 98 F (36.7 C) 98.1 F (36.7 C) 98 F (36.7 C)  TempSrc: Oral Oral Oral Oral  SpO2: 97% 100% 100% 98%  Weight:      Height:        General exam: Young male, moderately built and nourished lying comfortably propped up in bed without distress.  In good spirits. Respiratory system: Clear to auscultation.  No increased work of breathing.  Right TDC. Cardiovascular system: S1 & S2 heard, RRR. No JVD, murmurs, rubs, gallops or clicks. No pedal edema.  Telemetry per my review: Sinus rhythm.  No recurrence of SVT. Gastrointestinal system: Abdomen is nondistended, soft and nontender. No organomegaly or masses felt. Normal bowel sounds heard. Central nervous system: Alert and oriented. No focal neurological deficits. Extremities: Symmetric 5 x 5 power.  Left upper extremity wrapped in postop Ace wrap.  Has drain with small amount of dark blood in the bulb. Skin: No rashes, lesions or ulcers Psychiatry: Judgement and insight appear normal. Mood & affect appropriate.     Data Reviewed:   I have personally reviewed following labs and imaging studies   CBC: Recent Labs  Lab 08/24/22 1159 08/25/22 0104 08/25/22 1825 08/26/22 0101  WBC 6.7 5.5  --  3.7*  NEUTROABS 5.0  --   --   --   HGB 9.7* 7.8* 8.5* 7.5*  HCT 29.3* 23.6* 26.4* 23.3*  MCV 102.1* 101.7*  --  102.2*  PLT 127* 104*  --  112*    Basic Metabolic Panel: Recent Labs  Lab 08/24/22 1159 08/25/22 0104 08/25/22 1956 08/26/22 0101  NA 142 137  --  136  K 4.2 3.6  --  4.9  CL 98 98  --  97*  CO2 30 28  --  29  GLUCOSE 89 93  --  147*  BUN 52* 62*  --  56*  CREATININE 12.67* 14.59*  --  12.01*  CALCIUM 9.3 8.6*  --  8.1*  MG  --   --  1.9  --   PHOS  --   --   --  4.7*    Liver Function Tests: Recent Labs  Lab  08/26/22 0101  ALBUMIN 3.2*    CBG: Recent Labs  Lab 08/25/22 1804  GLUCAP 112*    Microbiology Studies:   Recent Results (from the past 240 hour(s))  Blood culture (routine x 2)     Status: None (Preliminary result)   Collection Time: 08/24/22 12:29 PM   Specimen: BLOOD RIGHT WRIST  Result Value Ref Range Status   Specimen Description BLOOD RIGHT WRIST  Final   Special Requests   Final    BOTTLES DRAWN AEROBIC AND ANAEROBIC Blood Culture results may not be optimal due to an inadequate volume of blood received in culture bottles   Culture   Final    NO GROWTH 2 DAYS Performed at [facility]  [facility], [place] [address] [place], [place] [address]    Report Status PENDING  Incomplete  Blood culture (routine x 2)     Status: None (Preliminary result)   Collection Time: 08/24/22  6:37 PM   Specimen: BLOOD RIGHT HAND  Result Value Ref Range Status   Specimen Description BLOOD RIGHT HAND  Final   Special Requests   Final    BOTTLES DRAWN AEROBIC ONLY Blood Culture adequate volume   Culture   Final    NO GROWTH 2 DAYS Performed at [facility], [place] [address] [place], [place] [address]    Report Status PENDING  Incomplete    Radiology Studies:  ECHOCARDIOGRAM COMPLETE  Result Date: 08/26/2022    ECHOCARDIOGRAM REPORT   Patient Name:   HILDA STOLLE Date of Exam: 08/26/2022 Medical Rec #:  [id number]     Height:       69.0 in Accession #:    [id number]    Weight:       148.8 lb Date of Birth:  May 14, 1966     BSA:          1.822 m Patient Age:    55 years      BP:           140/96 mmHg Patient Gender: M             HR:           83 bpm. Exam Location:  Inpatient Procedure: 2D Echo, Cardiac Doppler and Color Doppler Indications:    Chest pain  History:        Patient has prior history of Echocardiogram examinations, most                 recent 05/07/2021. CHF; Risk Factors:Hypertension, Diabetes and                 ESRD.  Sonographer:    Melissa Morford RDCS (AE, PE) Referring Phys:  [id number] Daeshawn Redmann D Brigitt Mcclish IMPRESSIONS  1. Left ventricular ejection fraction, by estimation, is 45 to 50%. The left ventricle has mildly decreased function. The left ventricle demonstrates global hypokinesis. There is moderate concentric left ventricular hypertrophy of the inferior segment. Left ventricular diastolic parameters are indeterminate. AMYLI Score elevated.  2. Right ventricular systolic function is normal. The right ventricular size is normal.  3. Left atrial size was severely dilated.  4. The mitral valve is normal in structure. Trivial mitral valve regurgitation. No evidence of mitral stenosis.  5. The aortic valve is tricuspid. Aortic valve regurgitation is trivial. No aortic stenosis is present.  6. The inferior vena cava is normal in size with greater than 50% respiratory variability, suggesting right atrial pressure of 3 mmHg.  7. Cannot exclude a small PFO. Comparison(s): Prior images reviewed side by side. LV function has improved. Conclusion(s)/Recommendation(s): Consider outpatient pyrophosphate imaging or CMR. FINDINGS  Left Ventricle: Left ventricular ejection fraction, by estimation, is 45 to 50%. The left ventricle has mildly decreased function. The left ventricle demonstrates global hypokinesis. The left ventricular internal cavity size was normal in size. There is  moderate concentric left ventricular hypertrophy of the inferior segment. Left ventricular diastolic parameters are indeterminate. Right Ventricle: The right ventricular size is normal. No increase in right ventricular wall thickness. Right ventricular systolic function is normal. Left Atrium: Left atrial size was severely dilated. Right Atrium: Right atrial size was normal in size. Pericardium: Trivial pericardial effusion is present. Mitral Valve: The mitral valve is  normal in structure. Trivial mitral valve regurgitation. No evidence of mitral valve stenosis. Tricuspid Valve: The tricuspid valve is normal in structure. Tricuspid  valve regurgitation is trivial. No evidence of tricuspid stenosis. Aortic Valve: The aortic valve is tricuspid. Aortic valve regurgitation is trivial. No aortic stenosis is present. Pulmonic Valve: The pulmonic valve was normal in structure. Pulmonic valve regurgitation is not visualized. No evidence of pulmonic stenosis. Aorta: The aortic root and ascending aorta are structurally normal, with no evidence of dilitation. Venous: The inferior vena cava is normal in size with greater than 50% respiratory variability, suggesting right atrial pressure of 3 mmHg. IAS/Shunts: Cannot exclude a small PFO.  LEFT VENTRICLE PLAX 2D LVIDd:         5.70 cm   Diastology LVIDs:         4.50 cm   LV e' medial:    4.68 cm/s LV PW:         1.10 cm   LV E/e' medial:  19.7 LV IVS:        0.90 cm   LV e' lateral:   7.29 cm/s LVOT diam:     2.40 cm   LV E/e' lateral: 12.6 LV SV:         92 LV SV Index:   50 LVOT Area:     4.52 cm  RIGHT VENTRICLE RV S prime:     18.50 cm/s TAPSE (M-mode): 3.7 cm LEFT ATRIUM            Index        RIGHT ATRIUM           Index LA diam:      3.10 cm  1.70 cm/m   RA Area:     19.20 cm LA Vol (A2C): 71.9 ml  39.46 ml/m  RA Volume:   58.20 ml  31.94 ml/m LA Vol (A4C): 109.0 ml 59.82 ml/m  AORTIC VALVE LVOT Vmax:   118.00 cm/s LVOT Vmean:  82.400 cm/s LVOT VTI:    0.203 m  AORTA Ao Root diam: 3.30 cm Ao Asc diam:  3.80 cm MITRAL VALVE                TRICUSPID VALVE MV Area (PHT): 6.27 cm     TR Peak grad:   19.9 mmHg MV Decel Time: 121 msec     TR Vmax:        223.00 cm/s MV E velocity: 92.10 cm/s MV A velocity: 128.00 cm/s  SHUNTS MV E/A ratio:  0.72         Systemic VTI:  0.20 m                             Systemic Diam: 2.40 cm Rudean Haskell MD Electronically signed by Rudean Haskell MD Signature Date/Time: 08/26/2022/12:57:15 PM    Final    DG CHEST PORT 1 VIEW  Result Date: 08/25/2022 CLINICAL DATA:  Chest pain and shortness of breath EXAM: PORTABLE CHEST 1 VIEW COMPARISON:  Chest  x-ray 05/18/2022 FINDINGS: Right-sided central venous catheter tip projects over the SVC. The heart is mildly enlarged, unchanged. The lungs are now clear. There is no pleural effusion or pneumothorax. No acute fractures are seen. IMPRESSION: 1. No active disease. 2. Stable mild cardiomegaly. Electronically Signed   By: Ronney Asters M.D.   On: 08/25/2022 20:53    Scheduled Meds:    amLODipine  5 mg Oral Daily  calcitRIOL  1 mcg Oral Q M,W,F-HD   carvedilol  6.25 mg Oral BID   Chlorhexidine Gluconate Cloth  6 each Topical Q0600   darbepoetin (ARANESP) injection - DIALYSIS  200 mcg Subcutaneous Q Tue-1800   losartan  25 mg Oral Daily   multivitamin  1 tablet Oral QHS   sucroferric oxyhydroxide  500 mg Oral TID WC    Continuous Infusions:     LOS: 1 day     Vernell Leep, MD,  FACP, FHM, SFHM, [facility], [place]     To contact the attending provider between 7A-7P or the covering provider during after hours 7P-7A, please log into the web site [email or web address] and access using universal [place] password for that web site. If you do not have the password, please call the hospital operator.  08/26/2022, 3:43 PM

## 2022-08-27 DIAGNOSIS — I428 Other cardiomyopathies: Secondary | ICD-10-CM

## 2022-08-27 DIAGNOSIS — T82510S Breakdown (mechanical) of surgically created arteriovenous fistula, sequela: Secondary | ICD-10-CM

## 2022-08-27 LAB — BASIC METABOLIC PANEL
Anion gap: 18 — ABNORMAL HIGH (ref 5–15)
BUN: 86 mg/dL — ABNORMAL HIGH (ref 6–20)
CO2: 25 mmol/L (ref 22–32)
Calcium: 8.8 mg/dL — ABNORMAL LOW (ref 8.9–10.3)
Chloride: 95 mmol/L — ABNORMAL LOW (ref 98–111)
Creatinine, Ser: 15.36 mg/dL — ABNORMAL HIGH (ref 0.61–1.24)
GFR, Estimated: 3 mL/min — ABNORMAL LOW (ref >60)
Glucose, Bld: 86 mg/dL (ref 70–99)
Potassium: 4.2 mmol/L (ref 3.5–5.1)
Sodium: 138 mmol/L (ref 135–145)

## 2022-08-27 LAB — CBC
HCT: 20.7 % — ABNORMAL LOW (ref 39.0–52.0)
Hemoglobin: 7 g/dL — ABNORMAL LOW (ref 13.0–17.0)
MCH: 34 pg (ref 26.0–34.0)
MCHC: 33.8 g/dL (ref 30.0–36.0)
MCV: 100.5 fL — ABNORMAL HIGH (ref 80.0–100.0)
Platelets: 104 10*3/uL — ABNORMAL LOW (ref 150–400)
RBC: 2.06 MIL/uL — ABNORMAL LOW (ref 4.22–5.81)
RDW: 14.3 % (ref 11.5–15.5)
WBC: 5.2 10*3/uL (ref 4.0–10.5)
nRBC: 0 % (ref 0.0–0.2)

## 2022-08-27 LAB — PREPARE RBC (CROSSMATCH)

## 2022-08-27 MED ORDER — HEPARIN SODIUM (PORCINE) 1000 UNIT/ML IJ SOLN
INTRAMUSCULAR | Status: AC
  Administered 2022-08-27: 3800 [IU]
  Filled 2022-08-27: qty 4

## 2022-08-27 MED ORDER — SODIUM CHLORIDE 0.9% IV SOLUTION: Freq: Once | INTRAVENOUS | Status: DC

## 2022-08-27 MED ORDER — CARVEDILOL 6.25 MG PO TABS: 6.2500 mg | ORAL_TABLET | Freq: Two times a day (BID) | ORAL | 2 refills | Status: DC

## 2022-08-27 MED ORDER — OXYCODONE HCL 5 MG PO TABS: 5.0000 mg | ORAL_TABLET | Freq: Four times a day (QID) | ORAL | 0 refills | Status: DC | PRN

## 2022-08-27 NOTE — Progress Notes (Signed)
[facility] Progress Note  Name: Phillip Lynch MRN: [id number] DOB: 1966/01/23  Chief Complaint:  Arm pain   Subjective:  Last HD on 3/11 with 2 kg UF.  He and I discussed risks/benefits/indications for PRBC's and he does consent to blood transfusion.  He has been in communication with his job to update them.   Review of systems:    He had shortness of breath yesterday with last HD; now resolved Denies any chest pain today or yesterday.  He had chest discomfort with last HD Denies nausea or vomiting   ---------------- Background on consult:  HPI: The patient is a 57 y.o. year-old w/ PMH as below who presented to ED earlier today for new onset pain of his HD access/ fistula. Pt seen in ED by [facility] who noted L arm was swollen, he had the new BBF done 05/20/22. Pseuoaneurysm was noted and  plan is for OR repair tomorrow. We are asked to see for dialysis.    Intake/Output Summary (Last 24 hours) at 08/27/2022 0732 Last data filed at 08/26/2022 2000 Gross per 24 hour  Intake 240 ml  Output 15 ml  Net 225 ml    Vitals:  Vitals:   08/26/22 1835 08/26/22 2000 08/27/22 0239 08/27/22 0531  BP: (!) 148/88 (!) 134/54  (!) 142/90  Pulse: 89 80    Resp: '18 20  16  '$ Temp: 98.2 F (36.8 C) 98.2 F (36.8 C) 98.1 F (36.7 C) 98.2 F (36.8 C)  TempSrc: Oral Oral Oral Oral  SpO2: 100% 95% 100%   Weight:      Height:         Physical Exam:    General adult male in bed in NAD HEENT normocephalic atraumatic extraocular movements intact sclera anicteric Neck supple trachea midline Lungs clear to auscultation bilaterally normal work of breathing on room air  Heart S1S2 no rub Abdomen soft nontender nondistended Extremities no edema  Psych normal mood and affect Neuro alert and oriented x 3 provides hx and follows commands Access RIJ tunn catheter; left AVF with bruit - wrapped and with drain in place  Medications reviewed   Labs:     Latest Ref Rng & Units 08/26/2022     1:01 AM 08/25/2022    1:04 AM 08/24/2022   11:59 AM  BMP  Glucose 70 - 99 mg/dL 147  93  89   BUN 6 - 20 mg/dL 56  62  52   Creatinine 0.61 - 1.24 mg/dL 12.01  14.59  12.67   Sodium 135 - 145 mmol/L 136  137  142   Potassium 3.5 - 5.1 mmol/L 4.9  3.6  4.2   Chloride 98 - 111 mmol/L 97  98  98   CO2 22 - 32 mmol/L '29  28  30   '$ Calcium 8.9 - 10.3 mg/dL 8.1  8.6  9.3     OP HD: MWF G-O  4h  400/800   64.5kg  2/2.5 bath  TDC   Hep 6500 - last HD 3/08, post wt 64.8kg - rocaltrol 1.0 mcg po tiw - mircera 200 mcg IV q 2 wks, has not started yet   Assessment/Plan:   L arm pain - due to pseudoaneurysm and hematoma of L arm AVF. Fistula had recently been started on cannulation outpatient; created in dec 2023.  S/p hematoma evacuation and fistula revision and repair on 3/11 with Dr. Virl Cagey.  ESRD -  HD per MWF schedule.  Using tunneled catheter.  BMP  HTN - optimize volume with HD; improved with pain control as well  Cardiology notes that he has been on coreg 6.25 mg BID and to continue this regimen outpatient for now Chest pain - s/p cardiology eval and felt secondary to hypertensive urgency. Chronic heart failure with reduced EF - s/p cardiology eval here. Optimize volume with HD  Anemia esrd as well as acute blood loss with the pseudoaneurysm.  Last esa in OP unit was in January.  He was due for high dose esa to start next HD outpatient.  He was ordered for aranesp 200 mcg weekly - dose missed on Monday but he got it Tuesday, 3/12.  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  Disposition per primary team.  [facility] for discharge today after PRBC's and HD from a nephrology standpoint.  Last HD with chest discomfort so would dialyze here  Claudia Desanctis, MD 08/27/2022 8:06 AM

## 2022-08-27 NOTE — Discharge Summary (Signed)
Physician Discharge Summary   Patient: Phillip Lynch MRN: [id number] DOB: 13-Sep-1965  Admit date:     08/24/2022  Discharge date: 08/27/22  Discharge Physician: Oswald Hillock   PCP: Patient, [name]   Recommendations at discharge:   Follow-up vascular surgery in 3 weeks Follow-up cardiology as outpatient  Discharge Diagnoses: Principal Problem:   Pseudoaneurysm ([place]) Active Problems:   End stage renal disease on dialysis ([place])   Anemia of chronic kidney failure, stage 5 (HCC)   Pseudoaneurysm of AV hemodialysis fistula (HCC)  Resolved Problems:   * No resolved hospital problems. *  Hospital Course:  57 year old male with medical history significant for ESRD on hemodialysis, chronic HFpEF, HTN, presented to the ED on 08/24/2022 with complaints of left arm swelling and pain.  Admitted for symptomatic pseudoaneurysm of left arm brachiobasilic AVF due to traumatic cannulation.  Vascular surgery consulted and s/p revision of LUE AV fistula on 3/11.  Nephrology consulted for HD needs via right TDC.  Developed transient chest pain and an episode of NSVT at HD on 3/11.  Cardiology consulted.   Assessment and Plan:  Symptomatic pseudoaneurysm of left arm brachiobasilic AVF due to traumatic cannulation: S/p left brachiobasilic fistula [id number].  Cleared to start using the fistula a week PTA.  First dialysis was unremarkable.  On 08/20/2022, he had traumatic cannulation of his access and after removing the needles, he had severe pain and swelling in his arm.  Failed conservative therapy with compression, heat pack, continued worsening of pain and presented to the ED.  Vascular surgery consulted and s/p hematoma evacuation, revision and repair of LUE AV fistula on 3/11 JP drain was removed per vascular surgery Plan to follow-up as outpatient in 3 weeks Will order OT as outpatient   ESRD on MWF HD:  Nephrology consulted.  Underwent HD on 3/11 for about 2 hours via right TDC.  Dialysis had to  be cut short due to chest pain.   Chest pain 3/11: Developed squeezing type of central chest pain after approximately 2 hours of dialysis and 2 L of volume removal.  Dialysis was stopped.  EKG without acute changes.  HS Troponin x 2 negative.  Chest x-ray without acute findings.  Chest pain and transient dyspnea spontaneously resolved. TTE repeated 3/12: LVEF 45-50%, appears improved compared to prior.  LV demonstrates global hypokinesis.  Moderate LVH.  No aortic stenosis.  [facility] cardiology consulted; did not feel that chest pain was cardiac in origin.  Likely in setting of hypertensive urgency.  Patient had normal coronaries on angiogram 9/23.  No further ischemic workup recommended.   NSVT 3/11: 11 beat NSVT noted while on hemodialysis.  No recurrence on telemetry.  TTE results as above.  Continue carvedilol 6.25 Mg twice daily.  Potassium and magnesium are okay.  Recommend to continue Coreg 6.25 mg p.o. twice daily   Essential hypertension: Uncontrolled despite carvedilol 6.25 Mg twice daily.  Resumed home dose of amlodipine 5 Mg daily & Cozaar 25 Mg daily.  Controlled.  Was markedly elevated yesterday during episode of chest pain, likely related to chest pain. -Blood pressure is better controlled, continue Coreg, losartan   Anemia in ESRD/postop acute blood loss anemia: Hemoglobin has dropped from 9.7-7.8. Related to pseudoaneurysm/hematoma.  Nephrology planning ESA at dialysis.  Hemoglobin was 7.0 this morning.  S/p 1 unit PRBC today during hemodialysis   Thrombocytopenia: Appears chronic and stable.   Metabolic bone disease: Management per nephrology         Consultants:  Cardiology Procedures performed:  Disposition: Home Diet recommendation:  Discharge Diet Orders (From admission, onward)     Start     Ordered   08/27/22 0000  Diet - low sodium heart healthy        08/27/22 1509           Cardiac diet DISCHARGE MEDICATION: Allergies as of 08/27/2022        Reactions   Nsaids Other (See Comments)   Patient was born with only 1 kidney and was told to not take NSAID(s)        Medication List     TAKE these medications    amLODipine 5 MG tablet Commonly known as: NORVASC Take 5 mg by mouth daily.   calcitRIOL 0.5 MCG capsule Commonly known as: ROCALTROL Take 0.5 mcg by mouth daily.   carvedilol 6.25 MG tablet Commonly known as: COREG Take 1 tablet (6.25 mg total) by mouth 2 (two) times daily. What changed:  how much to take when to take this   Dialyvite 800 0.8 MG Tabs Take 1 tablet by mouth daily.   losartan 25 MG tablet Commonly known as: COZAAR Take 25 mg by mouth daily.   oxyCODONE 5 MG immediate release tablet Commonly known as: Oxy IR/ROXICODONE Take 1-2 tablets (5-10 mg total) by mouth every 6 (six) hours as needed for moderate pain.   Sensipar 30 MG tablet Generic drug: cinacalcet Take 30 mg by mouth daily with supper.   Velphoro 500 MG chewable tablet Generic drug: sucroferric oxyhydroxide Chew 500 mg by mouth 3 (three) times daily with meals.        Follow-up Information     Rex Kras, DO Follow up on 09/01/2022.   Specialties: Cardiology, Vascular Surgery Why: 10:15 AM Contact information: [place] [address] [phone]         Broadus John, MD. Schedule an appointment as soon as possible for a visit in 3 week(s).   Specialty: Vascular Surgery Contact information: [place] [address] [place] at [facility] Follow up.   Specialty: Rehabilitation Why: referral made for Outpt PT- they will contact you to schedule- if you have not heard anything after 7 days post discharge- you may call them to check on status Contact information: [address] [id number] [place]               Discharge Exam: Danley Danker Weights    08/25/22 1446 08/27/22 0822 08/27/22 1220  Weight: 67.5 kg 67 kg 66.3 kg   General-appears in no acute distress Heart-S1-S2, regular, no murmur auscultated Lungs-clear to auscultation bilaterally, no wheezing or crackles auscultated Abdomen-soft, nontender, no organomegaly Extremities-no edema in the lower extremities Neuro-alert, oriented x3, no focal deficit noted  Condition at discharge: good  The results of significant diagnostics from this hospitalization (including imaging, microbiology, ancillary and laboratory) are listed below for reference.   Imaging Studies: ECHOCARDIOGRAM COMPLETE  Result Date: 08/26/2022    ECHOCARDIOGRAM REPORT   Patient Name:   PEARCE HERBIN Date of Exam: 08/26/2022 Medical Rec #:  [id number]     Height:       69.0 in Accession #:    [id number]    Weight:       148.8 lb Date of Birth:  1965-08-27     BSA:          1.822 m Patient  Age:    57 years      BP:           140/96 mmHg Patient Gender: M             HR:           83 bpm. Exam Location:  Inpatient Procedure: 2D Echo, Cardiac Doppler and Color Doppler Indications:    Chest pain  History:        Patient has prior history of Echocardiogram examinations, most                 recent 05/07/2021. CHF; Risk Factors:Hypertension, Diabetes and                 ESRD.  Sonographer:    Melissa Morford RDCS (AE, PE) Referring Phys: [id number] ANAND D HONGALGI IMPRESSIONS  1. Left ventricular ejection fraction, by estimation, is 45 to 50%. The left ventricle has mildly decreased function. The left ventricle demonstrates global hypokinesis. There is moderate concentric left ventricular hypertrophy of the inferior segment. Left ventricular diastolic parameters are indeterminate. AMYLI Score elevated.  2. Right ventricular systolic function is normal. The right ventricular size is normal.  3. Left atrial size was severely dilated.  4. The mitral valve is normal in structure. Trivial mitral valve regurgitation. No evidence of mitral  stenosis.  5. The aortic valve is tricuspid. Aortic valve regurgitation is trivial. No aortic stenosis is present.  6. The inferior vena cava is normal in size with greater than 50% respiratory variability, suggesting right atrial pressure of 3 mmHg.  7. Cannot exclude a small PFO. Comparison(s): Prior images reviewed side by side. LV function has improved. Conclusion(s)/Recommendation(s): Consider outpatient pyrophosphate imaging or CMR. FINDINGS  Left Ventricle: Left ventricular ejection fraction, by estimation, is 45 to 50%. The left ventricle has mildly decreased function. The left ventricle demonstrates global hypokinesis. The left ventricular internal cavity size was normal in size. There is  moderate concentric left ventricular hypertrophy of the inferior segment. Left ventricular diastolic parameters are indeterminate. Right Ventricle: The right ventricular size is normal. No increase in right ventricular wall thickness. Right ventricular systolic function is normal. Left Atrium: Left atrial size was severely dilated. Right Atrium: Right atrial size was normal in size. Pericardium: Trivial pericardial effusion is present. Mitral Valve: The mitral valve is normal in structure. Trivial mitral valve regurgitation. No evidence of mitral valve stenosis. Tricuspid Valve: The tricuspid valve is normal in structure. Tricuspid valve regurgitation is trivial. No evidence of tricuspid stenosis. Aortic Valve: The aortic valve is tricuspid. Aortic valve regurgitation is trivial. No aortic stenosis is present. Pulmonic Valve: The pulmonic valve was normal in structure. Pulmonic valve regurgitation is not visualized. No evidence of pulmonic stenosis. Aorta: The aortic root and ascending aorta are structurally normal, with no evidence of dilitation. Venous: The inferior vena cava is normal in size with greater than 50% respiratory variability, suggesting right atrial pressure of 3 mmHg. IAS/Shunts: Cannot exclude a small  PFO.  LEFT VENTRICLE PLAX 2D LVIDd:         5.70 cm   Diastology LVIDs:         4.50 cm   LV e' medial:    4.68 cm/s LV PW:         1.10 cm   LV E/e' medial:  19.7 LV IVS:        0.90 cm   LV e' lateral:   7.29 cm/s LVOT diam:     2.40 cm  LV E/e' lateral: 12.6 LV SV:         92 LV SV Index:   50 LVOT Area:     4.52 cm  RIGHT VENTRICLE RV S prime:     18.50 cm/s TAPSE (M-mode): 3.7 cm LEFT ATRIUM            Index        RIGHT ATRIUM           Index LA diam:      3.10 cm  1.70 cm/m   RA Area:     19.20 cm LA Vol (A2C): 71.9 ml  39.46 ml/m  RA Volume:   58.20 ml  31.94 ml/m LA Vol (A4C): 109.0 ml 59.82 ml/m  AORTIC VALVE LVOT Vmax:   118.00 cm/s LVOT Vmean:  82.400 cm/s LVOT VTI:    0.203 m  AORTA Ao Root diam: 3.30 cm Ao Asc diam:  3.80 cm MITRAL VALVE                TRICUSPID VALVE MV Area (PHT): 6.27 cm     TR Peak grad:   19.9 mmHg MV Decel Time: 121 msec     TR Vmax:        223.00 cm/s MV E velocity: 92.10 cm/s MV A velocity: 128.00 cm/s  SHUNTS MV E/A ratio:  0.72         Systemic VTI:  0.20 m                             Systemic Diam: 2.40 cm Rudean Haskell MD Electronically signed by Rudean Haskell MD Signature Date/Time: 08/26/2022/12:57:15 PM    Final    DG CHEST PORT 1 VIEW  Result Date: 08/25/2022 CLINICAL DATA:  Chest pain and shortness of breath EXAM: PORTABLE CHEST 1 VIEW COMPARISON:  Chest x-ray 05/18/2022 FINDINGS: Right-sided central venous catheter tip projects over the SVC. The heart is mildly enlarged, unchanged. The lungs are now clear. There is no pleural effusion or pneumothorax. No acute fractures are seen. IMPRESSION: 1. No active disease. 2. Stable mild cardiomegaly. Electronically Signed   By: Ronney Asters M.D.   On: 08/25/2022 20:53   VAS US DUPLEX DIALYSIS ACCESS (AVF, AVG)  Result Date: 08/24/2022 DIALYSIS ACCESS Patient Name:  LUQMAAN PAKKALA  Date of Exam:   08/24/2022 Medical Rec #: [id number]      Accession #:    [id number] Date of Birth: 16-Jul-1965       Patient Gender: M Patient Age:   12 years Exam Location:  [facility] Procedure:      VAS US DUPLEX DIALYSIS ACCESS (AVF, AVG) Referring Phys: Marda Stalker --------------------------------------------------------------------------------  Reason for Exam: Swelling and pain status post dialysis 08/20/22. Access Site: Left Upper Extremity. Access Type: Basilic vein transposition. History: Patient had left basilic vein transposition 05/20/22. He began using          fistula last week on 08/18/22. After second session 3/6, he felt sharp          pain when needle was removed and has had progressive swelling and pain          since. Received dialysis Friday via [facility]. Limitations: Patient in severe pain with minimal touch Comparison Study: Prior duplex of dialysis access done 07/11/22 Performing Technologist: Sharion Dove RVS  Examination Guidelines: A complete evaluation includes B-mode imaging, spectral Doppler, color Doppler, and power Doppler as needed of all accessible portions of  each vessel. Unilateral testing is considered an integral part of a complete examination. Limited examinations for reoccurring indications may be performed as noted.  Findings:    Summary: There is a pseudoaneurysm noted off the basilic transposition AVF in the proximal upper medial arm. The pseudoaneurysm measures 1.24 cm X 2.16 cm. The neck measures 0.513 cm in width and 1.24 cm in length.  *See table(s) above for measurements and observations.  Diagnosing physician: Jamelle Haring Electronically signed by Jamelle Haring on 08/24/2022 at 4:34:12 PM.   --------------------------------------------------------------------------------   Final     Microbiology: Results for orders placed or performed during the hospital encounter of 08/24/22  Blood culture (routine x 2)     Status: None (Preliminary result)   Collection Time: 08/24/22 12:29 PM   Specimen: BLOOD RIGHT WRIST  Result Value Ref Range Status   Specimen Description  BLOOD RIGHT WRIST  Final   Special Requests   Final    BOTTLES DRAWN AEROBIC AND ANAEROBIC Blood Culture results may not be optimal due to an inadequate volume of blood received in culture bottles   Culture   Final    NO GROWTH 3 DAYS Performed at [facility], [place] [address], [place], [place] [address]    Report Status PENDING  Incomplete  Blood culture (routine x 2)     Status: None (Preliminary result)   Collection Time: 08/24/22  6:37 PM   Specimen: BLOOD RIGHT HAND  Result Value Ref Range Status   Specimen Description BLOOD RIGHT HAND  Final   Special Requests   Final    BOTTLES DRAWN AEROBIC ONLY Blood Culture adequate volume   Culture   Final    NO GROWTH 3 DAYS Performed at [facility], [place] [address] [place], [place] [address]    Report Status PENDING  Incomplete    Labs: CBC: Recent Labs  Lab 08/24/22 1159 08/25/22 0104 08/25/22 1825 08/26/22 0101 08/27/22 0112  WBC 6.7 5.5  --  3.7* 5.2  NEUTROABS 5.0  --   --   --   --   HGB 9.7* 7.8* 8.5* 7.5* 7.0*  HCT 29.3* 23.6* 26.4* 23.3* 20.7*  MCV 102.1* 101.7*  --  102.2* 100.5*  PLT 127* 104*  --  112* [id number]*   Basic Metabolic Panel: Recent Labs  Lab 08/24/22 1159 08/25/22 0104 08/25/22 1956 08/26/22 0101 08/27/22 0112  NA 142 137  --  136 138  K 4.2 3.6  --  4.9 4.2  CL 98 98  --  97* 95*  CO2 30 28  --  29 25  GLUCOSE 89 93  --  147* 86  BUN 52* 62*  --  56* 86*  CREATININE 12.67* 14.59*  --  12.01* 15.36*  CALCIUM 9.3 8.6*  --  8.1* 8.8*  MG  --   --  1.9  --   --   PHOS  --   --   --  4.7*  --    Liver Function Tests: Recent Labs  Lab 08/26/22 0101  ALBUMIN 3.2*   CBG: Recent Labs  Lab 08/25/22 1804  GLUCAP 112*    Discharge time spent: greater than 30 minutes.  Signed: Oswald Hillock, MD [facility] 08/27/2022

## 2022-08-27 NOTE — Progress Notes (Signed)
D/C order noted. Contacted Emilie Rutter to advise clinic of pt's d/c today and that pt should resume care on Friday.   Melven Sartorius Renal Navigator [phone]

## 2022-08-27 NOTE — Progress Notes (Signed)
Received patient in bed to unit.  Alert and oriented.  Informed consent signed and in chart.   TX duration: 3.5  Patient tolerated well.  Transported back to the room  Alert, without acute distress.  Hand-off given to patient's nurse.   Access used: RIGHT TDC Access issues: NONE  Total UF removed: 2L Medication(s) given: CALCITRIOL, 1 UNIT PRBC    08/27/22 1220  Vitals  Temp 98.1 F (36.7 C)  Temp Source Oral  BP (!) 152/99  MAP (mmHg) 114  BP Location Right Arm  BP Method Automatic  Patient Position (if appropriate) Lying  Pulse Rate 87  Pulse Rate Source Monitor  Resp 18  Oxygen Therapy  SpO2 98 %  O2 Device Room Air  During Treatment Monitoring  HD Safety Checks Performed Yes  Intra-Hemodialysis Comments Tx completed;Tolerated well  Dialysis Fluid Bolus Normal Saline  Bolus Amount (mL) 300 mL  Post Treatment  Dialyzer Clearance Lightly streaked  Duration of HD Treatment -hour(s) 3.5 hour(s)  Liters Processed 70  Fluid Removed (mL) 2000 mL  Hemodialysis Catheter Right Subclavian Double lumen Permanent (Tunneled)  Placement Date/Time: 07/15/21 1155   Time Out: Correct patient;Correct site;Correct procedure  Maximum sterile barrier precautions: Hand hygiene;Cap;Mask;Sterile gown;Sterile gloves;Large sterile sheet  Site Prep: Chlorhexidine (preferred)  Local Anes...  Site Condition No complications  Blue Lumen Status Flushed;Heparin locked  Red Lumen Status Flushed;Heparin locked  Purple Lumen Status N/A  Catheter fill solution Heparin 1000 units/ml  Catheter fill volume (Arterial) 1.9 cc  Catheter fill volume (Venous) 1.9  Dressing Type Transparent  Dressing Status Antimicrobial disc in place;Clean, Dry, Intact  Drainage Description None  Dressing Change Due 09/01/22  Post treatment catheter status Capped and Clamped      Phillip Lynch [facility]

## 2022-08-27 NOTE — TOC Transition Note (Signed)
Transition of Care (TOC) - CM/SW Discharge Note Marvetta Gibbons RN, BSN Transitions of Care Unit 4E- RN Case Manager See Treatment Team for direct phone #   Patient Details  Name: Phillip Lynch MRN: [id number] Date of Birth: [birth year]  Transition of Care [facility]) CM/SW Contact:  Dawayne Patricia, RN Phone Number: 08/27/2022, 3:12 PM   Clinical Narrative:    Pt stable for transition home today, s/p repair pseudoaneurysm of left arm- per MD pt will need outpt PT referral- closest location is [address] location.  Referral as been sent via epic to [place] rehab location for Outpt PT.   No further TOC needs noted.    Final next level of care: OP Rehab Barriers to Discharge: No Barriers Identified   Patient Goals and CMS Choice CMS Medicare.gov Compare Post Acute Care list provided to:: Patient Choice offered to / list presented to : NA  Discharge Placement            Home             Discharge Plan and Services Additional resources added to the After Visit Summary for     Discharge Planning Services: CM Consult Post Acute Care Choice: NA          DME Arranged: N/A DME Agency: NA       HH Arranged: NA HH Agency: NA        Social Determinants of Health (SDOH) Interventions SDOH Screenings   Food Insecurity: No Food Insecurity (08/25/2022)  Housing: Low Risk  (08/25/2022)  Transportation Needs: No Transportation Needs (08/25/2022)  Utilities: Not At Risk (08/25/2022)  Depression (PHQ2-9): Low Risk  (05/21/2022)  Financial Resource Strain: Low Risk  (05/21/2022)  Tobacco Use: Medium Risk (08/26/2022)     Readmission Risk Interventions    08/27/2022    3:12 PM 05/22/2022   12:42 PM  Readmission Risk Prevention Plan  Transportation Screening Complete Complete  PCP or Specialist Appt within 3-5 Days Complete   HRI or [place] Complete Complete  Social Work Consult for [place] Planning/Counseling Complete Complete  Palliative  Care Screening Not Applicable Not Applicable  Medication Review Press photographer) Complete Referral to Pharmacy

## 2022-08-27 NOTE — Progress Notes (Signed)
Discharge instructions given. Patient verbalized understanding and all questions were answered.  ?

## 2022-08-27 NOTE — Procedures (Signed)
Seen and examined on dialysis.  Procedure supervised.  Blood pressure 149/92 and HR 80.  RIJ tunn catheter.  Tolerating goal.  HD is going well and he is about to get his blood   Claudia Desanctis, MD 08/27/2022  9:01 AM

## 2022-08-27 NOTE — Progress Notes (Signed)
Will see following dialysis to evaluate the wound.

## 2022-08-27 NOTE — Progress Notes (Addendum)
  Progress Note    08/27/2022 2:23 PM 2 Days Post-Op  Subjective:  no complaints   Vitals:   08/27/22 1200 08/27/22 1220  BP: (!) 148/101 (!) 152/99  Pulse: 89 87  Resp: (!) 22 18  Temp:  98.1 F (36.7 C)  SpO2: 98% 98%   Physical Exam: Lungs:  non labored Incisions:  L arm incision c/d/i Extremities:  motor and sensation intact L hand; upper arm is soft Neurologic: A&O  CBC    Component Value Date/Time   WBC 5.2 08/27/2022 0112   RBC 2.06 (L) 08/27/2022 0112   HGB 7.0 (L) 08/27/2022 0112   HCT 20.7 (L) 08/27/2022 0112   PLT 104 (L) 08/27/2022 0112   MCV 100.5 (H) 08/27/2022 0112   MCH 34.0 08/27/2022 0112   MCHC 33.8 08/27/2022 0112   RDW 14.3 08/27/2022 0112   LYMPHSABS 1.1 08/24/2022 1159   MONOABS 0.4 08/24/2022 1159   EOSABS 0.1 08/24/2022 1159   BASOSABS 0.0 08/24/2022 1159    BMET    Component Value Date/Time   NA 138 08/27/2022 0112   NA 138 05/22/2021 1048   K 4.2 08/27/2022 0112   CL 95 (L) 08/27/2022 0112   CO2 25 08/27/2022 0112   GLUCOSE 86 08/27/2022 0112   BUN 86 (H) 08/27/2022 0112   BUN 58 (H) 05/22/2021 1048   CREATININE 15.36 (H) 08/27/2022 0112   CALCIUM 8.8 (L) 08/27/2022 0112   GFRNONAA 3 (L) 08/27/2022 0112   GFRAA 14 (L) 07/22/2019 0250    INR    Component Value Date/Time   INR 1.2 05/18/2022 0918     Intake/Output Summary (Last 24 hours) at 08/27/2022 1423 Last data filed at 08/27/2022 1220 Gross per 24 hour  Intake 555 ml  Output 2015 ml  Net -1460 ml     Assessment/Plan:  57 y.o. male is s/p L arm hematoma evacuation 2 Days Post-Op   L hand well perfused L arm incision appears to be healing well JP drain removed Encouraged patient to straighten L elbow when in bed OT to evaluate   Dagoberto Ligas, PA-C [facility] [phone] 08/27/2022 2:23 PM   VASCULAR STAFF ADDENDUM: I have independently interviewed and examined the patient. I agree with the above.  Would benefit from OT eval  and likely OT oupt Okay for d/c from my perspective with 3 week follow up for wound check   J. Melene Muller, MD [facility] Phone Number: [phone] 08/27/2022 2:25 PM

## 2022-08-27 NOTE — Evaluation (Signed)
Occupational Therapy Evaluation Patient Details Name: Phillip Lynch MRN: [id number] DOB: 03-11-1966 Today's Date: 08/27/2022   History of Present Illness 57 year old male presented to the ED on 08/24/2022 with complaints of left arm swelling and pain.  Admitted for symptomatic pseudoaneurysm of left arm brachiobasilic AVF due to traumatic cannulation.  Vascular surgery consulted and s/p revision of LUE AV fistula on 3/11.  PMH: h/o solitary kidney, Stage IV CKD, uncontrolled HTN, polysubstance abuse.   Clinical Impression   Phillip Lynch was evaluated s/p the above admission list. He is indep and works part-time at baseline. Upon evaluation he was mildly limited by LUE pain. Overall he is mod I for ADLs and indep for mobility. Pt does not need further acute OT services. Recommend d/c home without need for follow up OT.       Recommendations for follow up therapy are one component of a multi-disciplinary discharge planning process, led by the attending physician.  Recommendations may be updated based on patient status, additional functional criteria and insurance authorization.   Follow Up Recommendations  No OT follow up     Assistance Recommended at Discharge None  Patient can return home with the following Assist for transportation    Functional Status Assessment  Patient has had a recent decline in their functional status and demonstrates the ability to make significant improvements in function in a reasonable and predictable amount of time.  Equipment Recommendations  None recommended by OT       Precautions / Restrictions Precautions Precautions: Fall Restrictions Weight Bearing Restrictions: No      Mobility Bed Mobility Overal bed mobility: Needs Assistance Bed Mobility: Supine to Sit, Sit to Supine     Supine to sit: Independent Sit to supine: Independent        Transfers Overall transfer level: Needs assistance Equipment used: None Transfers: Sit to/from Stand Sit  to Stand: Independent                  Balance Overall balance assessment: No apparent balance deficits (not formally assessed)                                         ADL either performed or assessed with clinical judgement   ADL Overall ADL's : Needs assistance/impaired Eating/Feeding: Independent;Sitting   Grooming: Supervision/safety;Standing   Upper Body Bathing: Set up;Sitting   Lower Body Bathing: Supervison/ safety;Sit to/from stand   Upper Body Dressing : Set up;Sitting   Lower Body Dressing: Supervision/safety;Sit to/from stand   Toilet Transfer: Supervision/safety;Ambulation   Toileting- Clothing Manipulation and Hygiene: Supervision/safety       Functional mobility during ADLs: Supervision/safety General ADL Comments: no physical assist required     Vision Baseline Vision/History: 1 Wears glasses Vision Assessment?: No apparent visual deficits     Perception Perception Perception Tested?: No   Praxis Praxis Praxis tested?: Not tested    Pertinent Vitals/Pain Pain Assessment Pain Assessment: Faces Faces Pain Scale: Hurts a little bit Pain Location: LUE Pain Descriptors / Indicators: Discomfort Pain Intervention(s): Limited activity within patient's tolerance, Monitored during session     Hand Dominance Right   Extremity/Trunk Assessment Upper Extremity Assessment Upper Extremity Assessment: LUE deficits/detail LUE Deficits / Details: Limited elbow and wrist ROM due to pain and ace wrap. Pt guarding arm appropriately and using as a functional assist LUE Sensation: WNL LUE Coordination: decreased gross motor   Lower Extremity  Assessment Lower Extremity Assessment: Overall WFL for tasks assessed   Cervical / Trunk Assessment Cervical / Trunk Assessment: Normal   Communication Communication Communication: No difficulties   Cognition Arousal/Alertness: Awake/alert Behavior During Therapy: WFL for tasks  assessed/performed Overall Cognitive Status: Within Functional Limits for tasks assessed                     General Comments  VSS on RA            Home Living Family/patient expects to be discharged to:: Private residence Living Arrangements: Alone Available Help at Discharge: Family;Friend(s);Available PRN/intermittently Type of Home: Apartment Home Access: Stairs to enter Entrance Stairs-Number of Steps: 1   Home Layout: One level     Bathroom Shower/Tub: Teacher, early years/pre: Standard     Home Equipment: None          Prior Functioning/Environment Prior Level of Function : Independent/Modified Independent             Mobility Comments: no AD ADLs Comments: works part time        OT Problem List: Impaired UE functional use         OT Goals(Current goals can be found in the care plan section) Acute Rehab OT Goals Patient Stated Goal: home OT Goal Formulation: With patient Time For Goal Achievement: 08/27/22 Potential to Achieve Goals: Good   AM-PAC OT "6 Clicks" Daily Activity     Outcome Measure Help from another person eating meals?: None Help from another person taking care of personal grooming?: None Help from another person toileting, which includes using toliet, bedpan, or urinal?: None Help from another person bathing (including washing, rinsing, drying)?: None Help from another person to put on and taking off regular upper body clothing?: None Help from another person to put on and taking off regular lower body clothing?: None 6 Click Score: 24   End of Session Nurse Communication: Mobility status  Activity Tolerance: Patient tolerated treatment well Patient left: in bed;with call bell/phone within reach  OT Visit Diagnosis: Muscle weakness (generalized) (M62.81)                Time: [id number] OT Time Calculation (min): 14 min Charges:  OT General Charges $OT Visit: 1 Visit OT Evaluation $OT Eval Low  Complexity: 1 Low  Shade Flood, OTR/L Acute Rehabilitation Services Office [phone] Secure Chat Communication Preferred   Elliot Cousin 08/27/2022, 4:17 PM

## 2022-08-28 ENCOUNTER — Telehealth: Payer: Self-pay | Admitting: Nephrology

## 2022-08-28 LAB — TYPE AND SCREEN
ABO/RH(D): A POS
Antibody Screen: NEGATIVE
Unit division: 0

## 2022-08-28 LAB — BPAM RBC
Blood Product Expiration Date: 202403202359
ISSUE DATE / TIME: 202403130849
Unit Type and Rh: 6200

## 2022-08-28 NOTE — Telephone Encounter (Signed)
Transition of Care Contact from [place]  Date of Discharge: 08/27/22 Date of Contact: 08/28/22 Method of contact: phone - attempted  Attempted to contact patient to discuss transition of care from inpatient admission.  Patient did not answer the phone.  Will plan to follow up at dialysis.   Jen Mow, PA-C [employer]

## 2022-08-29 LAB — CULTURE, BLOOD (ROUTINE X 2)
Culture: NO GROWTH
Culture: NO GROWTH
Special Requests: ADEQUATE

## 2022-09-01 ENCOUNTER — Ambulatory Visit: Payer: 59 | Admitting: Cardiology

## 2022-09-04 ENCOUNTER — Encounter: Payer: Self-pay | Admitting: Physical Therapy

## 2022-09-04 ENCOUNTER — Ambulatory Visit: Payer: 59 | Admitting: Physical Therapy

## 2022-09-04 DIAGNOSIS — M79622 Pain in left upper arm: Secondary | ICD-10-CM

## 2022-09-04 NOTE — Therapy (Signed)
Phillip Lynch arrives to OP neuro PT after having been hospitalized for pseudoaneurysm of AV HD fistula, of note received surgery for evacuation of hematoma with primary repair of L upper arm arteriovenous fistula on 08/25/22. He was just released from the hospital on 08/27/22.  He now arrives with primary complaints being pain from stitches/staples at surgical site, also tells me that per surgeon he is still "not to stretch or strain". All precautions were unclear today, unable to find any precautions in chart/surgical notes as well.   At this time we decided to hold off on PT eval- would like for him to have staples/stitches removed and allow surgical site to heal a bit more, will also need clear precautions prior to starting PT evaluation in the future.  Had our front desk here schedule him at an ortho PT clinic about 3 weeks out, he will call that site if he needs to change that appointment, or finds that shoulder/elbow ROM is doing well and he is not in need of PT in general.  Deniece Ree PT DPT PN2

## 2022-09-08 ENCOUNTER — Ambulatory Visit: Payer: 59 | Admitting: Cardiology

## 2022-09-08 ENCOUNTER — Encounter: Payer: Self-pay | Admitting: Cardiology

## 2022-09-08 VITALS — BP 147/97 | HR 94 | Resp 16 | Ht 69.0 in | Wt 146.0 lb

## 2022-09-08 DIAGNOSIS — I428 Other cardiomyopathies: Secondary | ICD-10-CM

## 2022-09-08 DIAGNOSIS — N186 End stage renal disease: Secondary | ICD-10-CM

## 2022-09-08 DIAGNOSIS — I5042 Chronic combined systolic (congestive) and diastolic (congestive) heart failure: Secondary | ICD-10-CM

## 2022-09-08 MED ORDER — CARVEDILOL 12.5 MG PO TABS: 12.5000 mg | ORAL_TABLET | Freq: Two times a day (BID) | ORAL | 0 refills | Status: DC

## 2022-09-08 NOTE — Progress Notes (Signed)
Date:  09/08/2022   ID:  Phillip Lynch, DOB Aug 09, 1965, MRN [id number]  PCP:  Patient, [name]  Cardiologist:  Rex Kras, DO, [facility] (established care 05/07/2021) Advanced heart failure specialist: Dr. Haroldine Laws  Date: 09/08/22 Last Office Visit: 04/03/2022  Chief Complaint  Patient presents with   Follow-up    Posthospitalization follow-up. Reevaluation of chest pain    HPI  Phillip Lynch is a 57 y.o. African-American male whose past medical history and cardiovascular risk factors include: Nonischemic cardiomyopathy, chronic combined systolic and diastolic heart failure, stage C, NYHA class II, influenza A infection, history of solitary kidney, hypertension with end-stage renal disease on hemodialysis, history of polysubstance abuse.   In November 2022 he presented with flulike symptoms and was diagnosed with influenza A.  During the hospitalization echocardiogram noted severely reduced LVEF with concerns for possible cardiac amyloidosis.  However, given the worsening renal function pending hemodialysis invasive angiography was not pursued due to concerns for CIN.  He was evaluated by advanced heart failure specialist Dr. Haroldine Laws and underwent appropriate amyloidosis workup.   Initially uptitration of GDMT was difficult due to renal function and orthostasis.  However after starting hemodialysis patient's blood pressures have been well-controlled and he did undergo angiography and was noted to have no obstructive CAD.  He was doing well overall until recently he was hospitalized in March 2024 for dialysis access related issues.  After hemodialysis sessions during hospitalization he had chest pain likely secondary to hypertensive crisis with SBP greater than 200 mmHg.  EKG and high sensitive troponins were not concerning for ACS and conservative management was recommended given his recent ischemic workup.  Since hospital discharge he has not had any recurrence of chest pain.  He is  following up with dialysis as scheduled.  But complains of having elevated blood pressures.  FUNCTIONAL STATUS: No structured exercise program or daily routine.   ALLERGIES: Allergies  Allergen Reactions   Nsaids Other (See Comments)    Patient was born with only 1 kidney and was told to not take NSAID(s)    MEDICATION LIST PRIOR TO VISIT: Current Meds  Medication Sig   amLODipine (NORVASC) 5 MG tablet Take 5 mg by mouth daily.   calcitRIOL (ROCALTROL) 0.5 MCG capsule Take 0.5 mcg by mouth daily.   losartan (COZAAR) 25 MG tablet Take 25 mg by mouth daily.   oxyCODONE (OXY IR/ROXICODONE) 5 MG immediate release tablet Take 1-2 tablets (5-10 mg total) by mouth every 6 (six) hours as needed for moderate pain.   SENSIPAR 30 MG tablet Take 30 mg by mouth daily with supper.   sucroferric oxyhydroxide (VELPHORO) 500 MG chewable tablet Chew 500 mg by mouth 3 (three) times daily with meals.   [DISCONTINUED] carvedilol (COREG) 6.25 MG tablet Take 1 tablet (6.25 mg total) by mouth 2 (two) times daily.     PAST MEDICAL HISTORY: Past Medical History:  Diagnosis Date   CHF (congestive heart failure) ([place])    Congenital renal agenesis, unilateral    Hypertension    Unintentional weight loss     PAST SURGICAL HISTORY: Past Surgical History:  Procedure Laterality Date   AV FISTULA PLACEMENT Left 05/13/2021   Procedure: ARTERIOVENOUS (AV) FISTULA CREATION;  Surgeon: Broadus John, MD;  Location: [place];  Service: Vascular;  Laterality: Left;   AV FISTULA PLACEMENT Left 05/20/2022   Procedure: LEFT ARM BASILIC VEIN TRANSPOSITION;  Surgeon: Angelia Mould, MD;  Location: [place];  Service: Vascular;  Laterality: Left;  CAPD INSERTION N/A 04/10/2022   Procedure: LAPAROSCOPIC INSERTION CONTINUOUS AMBULATORY PERITONEAL DIALYSIS  (CAPD) CATHETER WITH OMENTOPEXY;  Surgeon: Cherre Robins, MD;  Location: [place] OR;  Service: Vascular;  Laterality: N/A;   CAPD INSERTION  05/20/2022   Procedure:  PERITONEAL DIALYSIS CATHETER REMOVAL;  Surgeon: Angelia Mould, MD;  [facility];  Service: Vascular;;   IR FLUORO GUIDE CV LINE RIGHT  07/15/2021   IR US GUIDE VASC ACCESS RIGHT  07/16/2021   REVISON OF ARTERIOVENOUS FISTULA Left 08/25/2022   Procedure: EVACUATION HEMATOMA WITH PRIMARY REPAIR OF LEFT UPPER ARM ARTERIOVENOUS FISTULA;  Surgeon: Broadus John, MD;  Location: [place];  Service: Vascular;  Laterality: Left;   RIGHT/LEFT HEART CATH AND CORONARY ANGIOGRAPHY N/A 02/18/2022   Procedure: RIGHT/LEFT HEART CATH AND CORONARY ANGIOGRAPHY;  Surgeon: Adrian Prows, MD;  Location: [place] CV LAB;  Service: Cardiovascular;  Laterality: N/A;    FAMILY HISTORY: The patient family history includes Dementia in his mother; Kidney disease in his mother; Stroke in his mother.  SOCIAL HISTORY:  The patient  reports that he quit smoking about 16 months ago. His smoking use included cigars. He has never used smokeless tobacco. He reports current alcohol use. He reports that he does not currently use drugs after having used the following drugs: Marijuana.  REVIEW OF SYSTEMS: Review of Systems  Cardiovascular:  Negative for chest pain, dyspnea on exertion, leg swelling, near-syncope, orthopnea, palpitations, paroxysmal nocturnal dyspnea and syncope.  Respiratory:  Negative for shortness of breath.     PHYSICAL EXAM:    09/08/2022    2:54 PM 09/08/2022    2:51 PM 08/27/2022   12:30 PM  Vitals with BMI  Height  5\' 9"    Weight  146 lbs   BMI  [id number]   Systolic Q000111Q [id number] [id number]  Diastolic 97 [id number] 98  Pulse 94 93 86   Physical Exam  Constitutional: No distress.  Age appropriate, hemodynamically stable.   Eyes:  Arcus senilis  Neck: No JVD present.  Cardiovascular: Normal rate, regular rhythm, S1 normal, S2 normal, intact distal pulses and normal pulses. Exam reveals no gallop, no S3 and no S4.  No murmur heard. Pulses:      Dorsalis pedis pulses are 2+ on the right side and 2+ on the left  side.       Posterior tibial pulses are 2+ on the right side and 2+ on the left side.  Pulmonary/Chest: Effort normal and breath sounds normal. No stridor. He has no wheezes. He has no rales.  Dialysis catheter right anterior chest wall  Abdominal: Soft. Bowel sounds are normal. He exhibits no distension. There is no abdominal tenderness.  Musculoskeletal:        General: No edema.     Cervical back: Neck supple.  Neurological: He is alert and oriented to person, place, and time. He has intact cranial nerves (2-12).  Skin: Skin is warm and moist.   CARDIAC DATABASE: EKG: 06/18/2021: Sinus tachycardia, 104 bpm, normal axis, without underlying ischemia or injury pattern.  12/25/2021: Ectopic atrial rhythm, 99bpm, without underlying injury pattern.   Echocardiogram: 05/07/2021:  1. Left ventricular ejection fraction, by estimation, is 20 to 25%. The  left ventricle has severely decreased function. The left ventricle  demonstrates global hypokinesis. The left ventricular internal cavity size  was mildly dilated. There is moderate  left ventricular hypertrophy. Left ventricular diastolic parameters are  consistent with Grade I diastolic dysfunction (impaired relaxation). The  average left ventricular  global longitudinal strain is -9.0 %. The global  longitudinal strain is abnormal.  Strain is relatively preserved at the apical cap. Bright myocardium with  LVH and relatively preserved apical strain suggests the possibility of  cardiac amyloidosis.   2. Right ventricular systolic function is normal. The right ventricular  size is normal. There is normal pulmonary artery systolic pressure. The  estimated right ventricular systolic pressure is Q000111Q mmHg.   3. The mitral valve is normal in structure. Trivial mitral valve  regurgitation. No evidence of mitral stenosis.   4. The aortic valve is tricuspid. Aortic valve regurgitation is mild. No  aortic stenosis is present.   5. Aortic dilatation  noted. 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   6. The inferior vena cava is normal in size with greater than 50%  respiratory variability, suggesting right atrial pressure of 3 mmHg.    08/26/2022:  1. Left ventricular ejection fraction, by estimation, is 45 to 50%. The  left ventricle has mildly decreased function. The left ventricle  demonstrates global hypokinesis. There is moderate concentric left  ventricular hypertrophy of the inferior segment.  Left ventricular diastolic parameters are indeterminate. AMYLI Score  elevated.   2. Right ventricular systolic function is normal. The right ventricular  size is normal.   3. Left atrial size was severely dilated.   4. The mitral valve is normal in structure. Trivial mitral valve  regurgitation. No evidence of mitral stenosis.   5. The aortic valve is tricuspid. Aortic valve regurgitation is trivial.  No aortic stenosis is present.   6. The inferior vena cava is normal in size with greater than 50%  respiratory variability, suggesting right atrial pressure of 3 mmHg.   7. Cannot exclude a small PFO.   Comparison(s): Prior images reviewed side by side. LV function has  improved.    Stress Testing: No results found for this or any previous visit from the past 1095 days.  Heart Catheterization: 02/18/2022:   There is mild left ventricular systolic dysfunction.   LV end diastolic pressure is normal.   The left ventricular ejection fraction is 45-50% by visual estimate.   There is no mitral valve regurgitation.   Right & Left Heart Catheterization 02/18/22:  LV: 147/1, EDP 11 mmHg.  Ao 145/82, mean 112 mmHg.  No pressure gradient across the aortic valve. LVEF 45% with global hypokinesis.  No significant mitral regurgitation. LM: Large-caliber vessel, normal. LAD: Gives origin to large D1 and D2.  The LAD wraps around the apex and supplies most of the posterior wall, type IV LAD.  It is smooth and  normal. LCx: Moderate caliber vessel, gives origin to small PDA branch.  2 small marginal branches.  Smooth and normal. RI: Moderate caliber vessel, smooth and normal.   RA 13/12, mean 12 mmHg RV 29/7, EDP 12 mmHg PA 29/12, mean 24 mmHg. PW 24/21, mean 18 mmHg.  Ao saturation 97%, PA saturation 69%.  DP/+1.00 and CO 5.31, CI 3.01, normal.   Impression: Findings consistent with nonischemic cardiomyopathy with mild LV systolic dysfunction.  Preserved cardiac output and cardiac index without evidence of pulmonary hypertension.  No intracardiac shunting.  35 mill contrast utilized.   PYP Study  05/10/2021: Visual and quantitative assessment (grade 1, H/CLL equal 1.22) are equivocally suggestive of transthyretin amyloidosis.  LABORATORY DATA:    Latest Ref Rng & Units 08/27/2022    1:12 AM 08/26/2022    1:01 AM 08/25/2022    6:25  PM  CBC  WBC 4.0 - 10.5 K/uL 5.2  3.7    Hemoglobin 13.0 - 17.0 g/dL 7.0  7.5  8.5   Hematocrit 39.0 - 52.0 % 20.7  23.3  26.4   Platelets 150 - 400 K/uL 104  112         Latest Ref Rng & Units 08/27/2022    1:12 AM 08/26/2022    1:01 AM 08/25/2022    1:04 AM  CMP  Glucose 70 - 99 mg/dL 86  147  93   BUN 6 - 20 mg/dL 86  56  62   Creatinine 0.61 - 1.24 mg/dL 15.36  12.01  14.59   Sodium 135 - 145 mmol/L 138  136  137   Potassium 3.5 - 5.1 mmol/L 4.2  4.9  3.6   Chloride 98 - 111 mmol/L 95  97  98   CO2 22 - 32 mmol/L 25  29  28    Calcium 8.9 - 10.3 mg/dL 8.8  8.1  8.6     Lipid Panel     Component Value Date/Time   CHOL 151 05/07/2021 1644   TRIG 135 05/07/2021 1644   HDL 66 05/07/2021 1644   CHOLHDL 2.3 05/07/2021 1644   VLDL 27 05/07/2021 1644   LDLCALC 58 05/07/2021 1644    No components found for: "NTPROBNP" No results for input(s): "PROBNP" in the last 8760 hours. No results for input(s): "TSH" in the last 8760 hours.   BMP Recent Labs    08/25/22 0104 08/26/22 0101 08/27/22 0112  NA 137 136 138  K 3.6 4.9 4.2  CL 98 97* 95*   CO2 28 29 25   GLUCOSE 93 147* 86  BUN 62* 56* 86*  CREATININE 14.59* 12.01* 15.36*  CALCIUM 8.6* 8.1* 8.8*  GFRNONAA 4* 4* 3*    HEMOGLOBIN A1C Lab Results  Component Value Date   HGBA1C 4.6 (L) 05/07/2021   MPG 85.32 05/07/2021    IMPRESSION:    ICD-10-CM   1. Chronic combined systolic and diastolic heart failure (HCC)  I50.42 carvedilol (COREG) 12.5 MG tablet    2. Nonischemic cardiomyopathy (HCC)  I42.8 carvedilol (COREG) 12.5 MG tablet       RECOMMENDATIONS: Cecilia Fordham is a 57 y.o. African-American male whose past medical history and cardiac risk factors include: Chronic combined systolic and diastolic heart failure, stage C, NYHA class II, influenza A infection, history of solitary kidney, hypertension with end-stage renal disease on hemodialysis, history of polysubstance abuse.   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 Stage C, NYHA class II Medications reconciled. In the past has had issues with orthostasis which is limited up titration of GDMT. Increase carvedilol to 12.5 mg p.o. twice daily with close monitoring given his history of orthostatic hypotension. Will continue the current dose of losartan given him being on ESRD and hemodialysis.  Hospitalization records reviewed. Most recent echocardiogram reviewed. No additional cardiovascular testing warranted at this time.  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 Medication changes as noted above. Monitor for now  Management related to chronic comorbid conditions, review of recent discharge summary, hospitalization records, echocardiogram, labs.  Medication changes as noted above.  FINAL MEDICATION LIST END OF ENCOUNTER: Meds ordered this encounter  Medications   carvedilol (COREG) 12.5 MG tablet    Sig: Take 1 tablet  (12.5 mg total) by mouth  2 (two) times daily.    Dispense:  180 tablet    Refill:  0    Medications Discontinued During This Encounter  Medication Reason   B Complex-C-Folic Acid (DIALYVITE Q000111Q) 0.8 MG TABS    carvedilol (COREG) 6.25 MG tablet Reorder     Current Outpatient Medications:    amLODipine (NORVASC) 5 MG tablet, Take 5 mg by mouth daily., Disp: , Rfl:    calcitRIOL (ROCALTROL) 0.5 MCG capsule, Take 0.5 mcg by mouth daily., Disp: , Rfl:    losartan (COZAAR) 25 MG tablet, Take 25 mg by mouth daily., Disp: , Rfl:    oxyCODONE (OXY IR/ROXICODONE) 5 MG immediate release tablet, Take 1-2 tablets (5-10 mg total) by mouth every 6 (six) hours as needed for moderate pain., Disp: 20 tablet, Rfl: 0   SENSIPAR 30 MG tablet, Take 30 mg by mouth daily with supper., Disp: , Rfl:    sucroferric oxyhydroxide (VELPHORO) 500 MG chewable tablet, Chew 500 mg by mouth 3 (three) times daily with meals., Disp: , Rfl:    carvedilol (COREG) 12.5 MG tablet, Take 1 tablet (12.5 mg total) by mouth 2 (two) times daily., Disp: 180 tablet, Rfl: 0  No orders of the defined types were placed in this encounter.   There are no Patient Instructions on file for this visit.   --Continue cardiac medications as reconciled in final medication list. --Return in about 6 months (around 03/11/2023) for Follow up cardiomyopathy. . Or sooner if needed. --Continue follow-up with your primary care physician regarding the management of your other chronic comorbid conditions.  Patient's questions and concerns were addressed to his satisfaction. He voices understanding of the instructions provided during this encounter.   This note was created using a voice recognition software as a result there may be grammatical errors inadvertently enclosed that do not reflect the nature of this encounter. Every attempt is made to correct such errors.  Rex Kras, [place], [facility]  Pager:  [phone] Office: [phone]

## 2022-09-19 ENCOUNTER — Telehealth: Payer: Self-pay

## 2022-09-19 ENCOUNTER — Ambulatory Visit (INDEPENDENT_AMBULATORY_CARE_PROVIDER_SITE_OTHER): Payer: 59 | Admitting: Physician Assistant

## 2022-09-19 VITALS — BP 138/92 | HR 96 | Temp 97.8°F | Resp 16 | Ht 68.0 in | Wt 147.0 lb

## 2022-09-19 DIAGNOSIS — N186 End stage renal disease: Secondary | ICD-10-CM

## 2022-09-19 DIAGNOSIS — Z992 Dependence on renal dialysis: Secondary | ICD-10-CM

## 2022-09-19 NOTE — Telephone Encounter (Signed)
Pt seen in office today and had HD center [facility]ders) call requesting [place] fax them a letter stating pt can return to work. Per APP, pt can return to work at this time and letter was faxed.

## 2022-09-19 NOTE — Progress Notes (Signed)
  POST OPERATIVE OFFICE NOTE    CC:  F/u for surgery  HPI:  This is a 57 y.o. male who is s/pLeft arm incision hematoma evacuation Fistula revision-primary repair of left-sided brachiobasilic fistula after fistula infiltration resulting in a large hematoma in the left upper extremity.   The pain from the hematoma was so bad he was unable to work.  He is here today for f/u and staple removal.  He has a working [facility].    Pt returns today for follow up.  Pt states he has mild soreness at the incision site.  He is using the arm for adl's without issues.   Allergies  Allergen Reactions   Nsaids Other (See Comments)    Patient was born with only 1 kidney and was told to not take NSAID(s)    Current Outpatient Medications  Medication Sig Dispense Refill   amLODipine (NORVASC) 5 MG tablet Take 5 mg by mouth daily.     calcitRIOL (ROCALTROL) 0.5 MCG capsule Take 0.5 mcg by mouth daily.     carvedilol (COREG) 12.5 MG tablet Take 1 tablet (12.5 mg total) by mouth 2 (two) times daily. 180 tablet 0   losartan (COZAAR) 25 MG tablet Take 25 mg by mouth daily.     SENSIPAR 30 MG tablet Take 30 mg by mouth daily with supper.     sucroferric oxyhydroxide (VELPHORO) 500 MG chewable tablet Chew 500 mg by mouth 3 (three) times daily with meals.     oxyCODONE (OXY IR/ROXICODONE) 5 MG immediate release tablet Take 1-2 tablets (5-10 mg total) by mouth every 6 (six) hours as needed for moderate pain. (Patient not taking: Reported on 09/19/2022) 20 tablet 0   No current facility-administered medications for this visit.     ROS:  See HPI  Physical Exam:    Incision:  Incision healing, staples removed and steri strips placed Extremities:  palpable thrill in the fistula, no edema surrounding the fistula, no drainage or erythema, palpable radial pulse. Lungs non labored breathing   Assessment/Plan:  This is a 57 y.o. male who is s/p:  hematoma evacuation Fistula revision-primary repair of left-sided  brachiobasilic fistula   The fistula will be usable in 4 weeks.  He has a working [facility].  Try to avoid sticking the incision line.  F/U as needed.    Mosetta Pigeon PA-C [facility] [phone]   Clinic MD:  Karin Lieu

## 2022-09-23 ENCOUNTER — Telehealth: Payer: Self-pay

## 2022-09-23 NOTE — Telephone Encounter (Signed)
Caller: Marylu Lund @ [facility]  Concern: Requested ok for pt to do PT  Resolution:  Pt was given ok to return to work on 09/19/22 at his post-op visit, requested PT granted, instructed to call back if there are any questions

## 2023-03-12 ENCOUNTER — Ambulatory Visit: Payer: 59 | Admitting: Cardiology

## 2023-03-22 NOTE — Progress Notes (Unsigned)
Cardiology Office Note    Patient Name: Gavino Fouch Date of Encounter: 03/23/2023  Primary Care Provider:  Patient, [name] Primary Cardiologist:  Tessa Lerner, DO Primary Electrophysiologist: None   Past Medical History    Past Medical History:  Diagnosis Date   CHF (congestive heart failure) (HCC)    Congenital renal agenesis, unilateral    Hypertension    Unintentional weight loss     History of Present Illness  Lambros Cerro is a 57 y.o. male with a PMH of chronic combined CHF, NICM, ESRD (Tu,Thu,Sat), polysubstance abuse, HTN who presents today for 33-month follow-up.  Mr. Ressler was seen initially in 04/2021 by Dr. Odis Hollingshead with complaint of bodyaches, lightheadedness and weakness.  2D echo was completed showing EF of 20-25% with global hypokinesis and moderate LVH RVH with possible suggestion of amyloidosis with grade 1 DD.  EKG showed sinus tach patient was consulted by Dr. Gala Romney who recommended PYP scan to evaluate for amyloidosis that was negative.  Myeloma workup was also negative patient was placed on GDMT with carvedilol, hydralazine, Isordil, and Lasix.  He was placed on amlodipine with metoprolol twice daily management of BP.  He was started on hemodialysis with improvement to BP.  He completed R/LHC on 02/18/2022 that revealed NICM with mild LVH systolic dysfunction nonobstructive CAD.  He was last seen by Dr. Odis Hollingshead on 09/08/2022 recent hospitalization for dialysis access related issue with no evidence of ACS.  He reported no recurrence of chest pain since hospitalization but did note elevated BP.  He experience orthostasis in the past which limited his GDMT carvedilol was increased to 12.5 mg twice a day and losartan dose was continued.  During today's visit the patient reports that he has been doing well with no complaints of chest pain or shortness of breath since previous follow-up.  His blood pressure was elevated initially at 146/92 and remained elevated at 138/92.  He  reports compliance with his medications and denies any adverse reactions.  He reports that majority of his blood pressure medicines between 6 and 8 PM due to orthostasis.  He is still working part-time at [employer] and holds his Coreg the morning of dialysis.  He reports resolution of his symptoms and feels improvement with taking his medications in the evenings.  He is also monitoring his salt intake and is being followed by dietitian at dialysis..  Patient denies chest pain, palpitations, dyspnea, PND, orthopnea, nausea, vomiting, dizziness, syncope, edema, weight gain, or early satiety.   Review of Systems  Please see the history of present illness.    All other systems reviewed and are otherwise negative except as noted above.  Physical Exam    Wt Readings from Last 3 Encounters:  03/23/23 137 lb (62.1 kg)  09/19/22 147 lb (66.7 kg)  09/08/22 146 lb (66.2 kg)   VS: Vitals:   03/23/23 1442 03/23/23 1507  BP: (!) 146/92 (!) 138/92  Pulse: (!) 102   SpO2: 95%   ,Body mass index is 20.83 kg/m. GEN: Well nourished, well developed in no acute distress Neck: No JVD; No carotid bruits Pulmonary: Clear to auscultation without rales, wheezing or rhonchi  Cardiovascular: Normal rate. Regular rhythm. Normal S1. Normal S2.   Murmurs: There is no murmur.  ABDOMEN: Soft, non-tender, non-distended EXTREMITIES:  No edema; No deformity   EKG/LABS/ Recent Cardiac Studies   ECG personally reviewed by me today -none completed today  Risk Assessment/Calculations:          Lab Results  Component Value Date   WBC 5.2 08/27/2022   HGB 7.0 (L) 08/27/2022   HCT 20.7 (L) 08/27/2022   MCV 100.5 (H) 08/27/2022   PLT 104 (L) 08/27/2022   Lab Results  Component Value Date   CREATININE 15.36 (H) 08/27/2022   BUN 86 (H) 08/27/2022   NA 138 08/27/2022   K 4.2 08/27/2022   CL 95 (L) 08/27/2022   CO2 25 08/27/2022   Lab Results  Component Value Date   CHOL 151 05/07/2021   HDL 66 05/07/2021    LDLCALC 58 05/07/2021   TRIG 135 05/07/2021   CHOLHDL 2.3 05/07/2021    Lab Results  Component Value Date   HGBA1C 4.6 (L) 05/07/2021   Assessment & Plan    1.  Chronic combined CHF: -2D echo completed 04/2021 with EF of 20-25% repeat echo 08/2022 showing improved EF of 45-50% with moderate LVH and global hypokinesis -Patient reports that he is experienced any shortness of breath with exertion and is euvolemic on examination. -Volume is currently monitored and managed by nephrology -Continue carvedilol 12.5 mg twice daily and losartan 25 mg daily -Low sodium diet, fluid restriction <2L, and daily weights encouraged. Educated to contact our office for weight gain of 2 lbs overnight or 5 lbs in one week.   2.  Nonobstructive CAD: -s/p R/LHC with no evidence of obstructive disease -Patient reports no chest pain or angina since previous follow-up. -Continue carvedilol 12.5 mg twice daily  3.  Essential hypertension: -HYPERTENSION CONTROL Vitals:   03/23/23 1442 03/23/23 1507  BP: (!) 146/92 (!) 138/92    The patient's blood pressure is elevated above target today.  In order to address the patient's elevated BP: Blood pressure will be monitored at home to determine if medication changes need to be made.; A current anti-hypertensive medication was adjusted today.; The blood pressure is usually elevated in clinic.  Blood pressures monitored at home have been optimal.   We will increase Norvasc to 10 mg daily, continue carvedilol 12.5 mg twice daily and losartan 25 mg daily  4.  ESRD: -Dialysis Tuesday, Thursday, Saturday  Disposition: Follow-up with Tessa Lerner, DO or APP in 6 months    Signed, Napoleon Form, Leodis Rains, NP 03/23/2023, 6:07 PM [facility]

## 2023-03-23 ENCOUNTER — Encounter: Payer: Self-pay | Admitting: Nurse Practitioner

## 2023-03-23 ENCOUNTER — Ambulatory Visit: Payer: 59 | Attending: Cardiology | Admitting: Nurse Practitioner

## 2023-03-23 VITALS — BP 138/92 | HR 102 | Ht 68.0 in | Wt 137.0 lb

## 2023-03-23 DIAGNOSIS — I428 Other cardiomyopathies: Secondary | ICD-10-CM

## 2023-03-23 DIAGNOSIS — N186 End stage renal disease: Secondary | ICD-10-CM | POA: Diagnosis not present

## 2023-03-23 DIAGNOSIS — I5042 Chronic combined systolic (congestive) and diastolic (congestive) heart failure: Secondary | ICD-10-CM

## 2023-03-23 DIAGNOSIS — Z992 Dependence on renal dialysis: Secondary | ICD-10-CM

## 2023-03-23 DIAGNOSIS — I130 Hypertensive heart and chronic kidney disease with heart failure and stage 1 through stage 4 chronic kidney disease, or unspecified chronic kidney disease: Secondary | ICD-10-CM | POA: Diagnosis not present

## 2023-03-23 MED ORDER — AMLODIPINE BESYLATE 10 MG PO TABS: 10.0000 mg | ORAL_TABLET | Freq: Every day | ORAL | 3 refills | Status: DC

## 2023-03-23 NOTE — Patient Instructions (Signed)
Medication Instructions:  Your physician has recommended you make the following change in your medication:  INCREASE: amlodipine (Norvasc) to 10 mg by mouth once daily  *If you need a refill on your cardiac medications before your next appointment, please call your pharmacy*   Lab Work: NONE If you have labs (blood work) drawn today and your tests are completely normal, you will receive your results only by: MyChart Message (if you have MyChart) OR A paper copy in the mail If you have any lab test that is abnormal or we need to change your treatment, we will call you to review the results.   Testing/Procedures: NONE   Follow-Up: At [facility], you and your health needs are our priority.  As part of our continuing mission to provide you with exceptional heart care, we have created designated Provider Care Teams.  These Care Teams include your primary Cardiologist (physician) and Advanced Practice Providers (APPs -  Physician Assistants and Nurse Practitioners) who all work together to provide you with the care you need, when you need it.  We recommend signing up for the patient portal called "MyChart".  Sign up information is provided on this After Visit Summary.  MyChart is used to connect with patients for Virtual Visits (Telemedicine).  Patients are able to view lab/test results, encounter notes, upcoming appointments, etc.  Non-urgent messages can be sent to your provider as well.   To learn more about what you can do with MyChart, go to [email or web address].    Your next appointment:   6 month(s)  Provider:   Tessa Lerner, DO  or Robin Searing, NP      Other Instructions Please monitor your Blood Pressure as discussed with Alden Server, NP.

## 2023-04-29 IMAGING — US IR FLUORO GUIDE CV LINE*R*
1 series · 1 of 1 positions shown · non-contrast
Comparison: none

INDICATION: 55-year-old male with history of end-stage renal disease status post
left upper extremity fistula creation in April 2021 requiring
hemodialysis prior to full maturation of fistula.

[Series 1: ir fluoro guide cv line*right* · 1 of 1 slices shown]
[im 1/1]
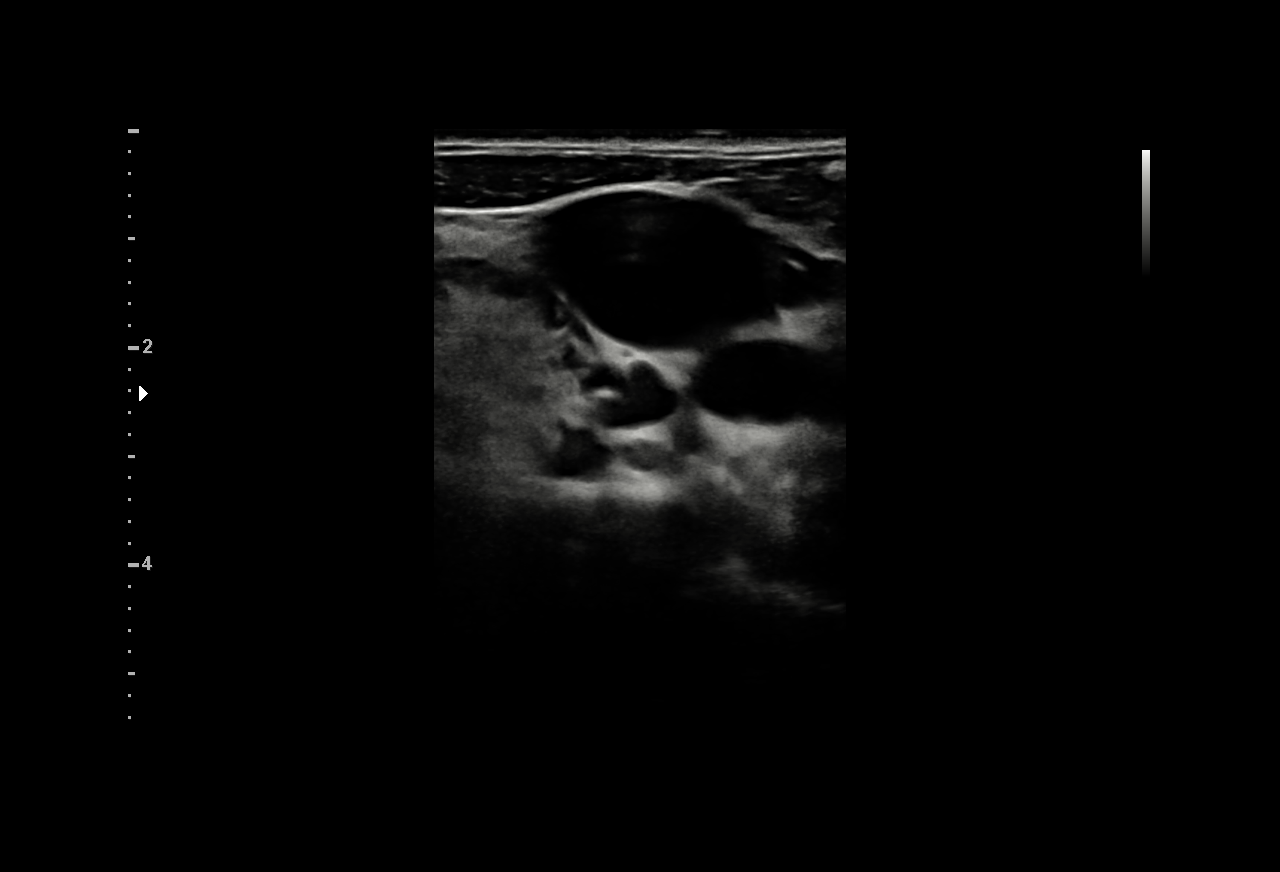

[1 of 1 positions shown; findings below may reference images not displayed]

EXAM:
TUNNELED CENTRAL VENOUS HEMODIALYSIS CATHETER PLACEMENT WITH
ULTRASOUND AND FLUOROSCOPIC GUIDANCE

MEDICATIONS:
Ancef 2 gm IV . The antibiotic was given in an appropriate time
interval prior to skin puncture.

ANESTHESIA/SEDATION:
Moderate (conscious) sedation was employed during this procedure. A
total of Versed 1 mg and Fentanyl 50 mcg was administered
intravenously.

Moderate Sedation Time: 12 minutes. The patient's level of
consciousness and vital signs were monitored continuously by
radiology nursing throughout the procedure under my direct
supervision.

FLUOROSCOPY TIME:  0 minutes 12 seconds (0.2 mGy).

COMPLICATIONS:
None immediate.



After creating a small venotomy incision, a 21 gauge micropuncture
kit was utilized to access the internal jugular vein. Real-time
ultrasound guidance was utilized for vascular access including the
acquisition of a permanent ultrasound image documenting patency of
the accessed vessel.

A Rosen wire was advanced to the level of the IVC and the
micropuncture sheath was exchanged for an 8 Fr dilator. A
French tunneled hemodialysis catheter measuring 19 cm from tip to
cuff was tunneled in a retrograde fashion from the anterior chest
wall to the venotomy incision. Serial dilation was then performed an
a peel-away sheath was placed.

The catheter was then placed through the peel-away sheath with the
catheter tip ultimately positioned within the right atrium. Final
catheter positioning was confirmed and documented with a spot
radiographic image. The catheter aspirates and flushes normally. The
catheter was flushed with appropriate volume heparin dwells.

The catheter exit site was secured with a 0-Silk retention suture.
The venotomy incision was closed with Dermabond. Sterile dressings
were applied. The patient tolerated the procedure well without
immediate post procedural complication.
IMPRESSION: Successful placement of 19 cm tip to cuff tunneled hemodialysis
catheter via the right internal jugular vein with catheter tip
terminating within the right atrium. The catheter is ready for
immediate use.

## 2023-07-30 IMAGING — CT CT RENAL STONE PROTOCOL
2 of 4 series · 16 of 46 positions shown, 18 images · non-contrast
Comparison: None Available.

CLINICAL DATA: Flank pain, kidney stone suspected Renal ischemia or
infarction



[Series 3: renal stone 5.0 · axial · 0.73mm/px · z∈[+931,+1311]mm · 13 of 86 slices shown, 15 images]
[im 5/86  soft-tissue]
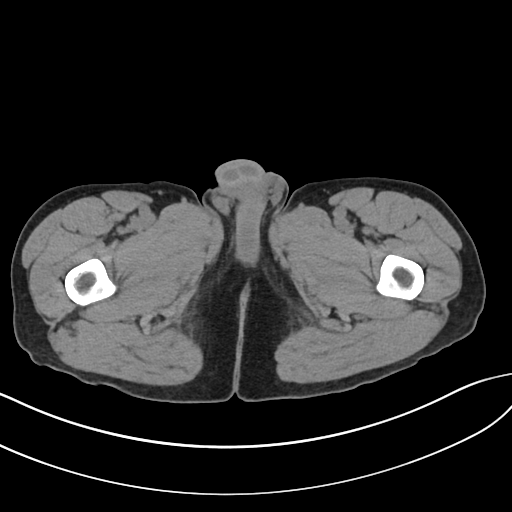
[im 5/86  bone]
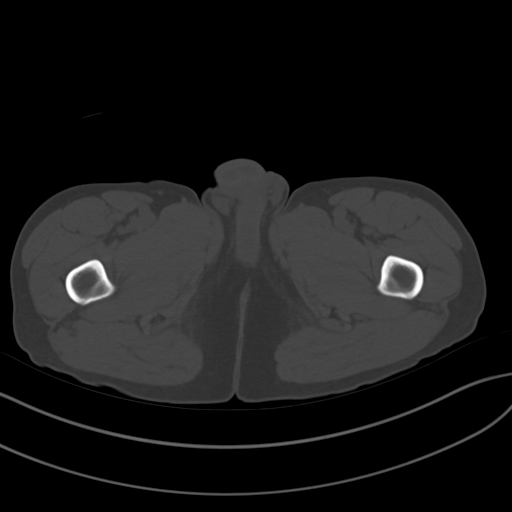
[im 13/86  soft-tissue]
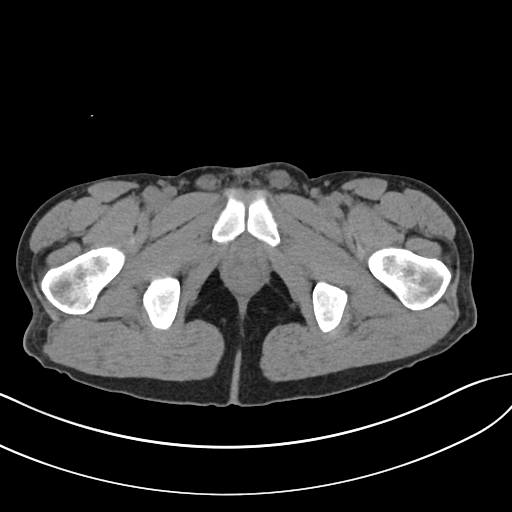
[im 17/86  soft-tissue]
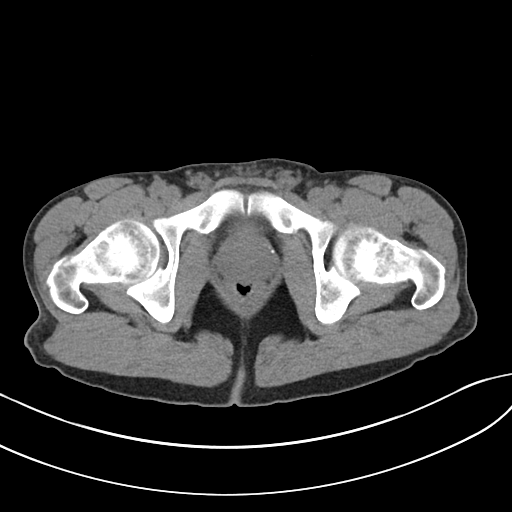
[im 25/86  soft-tissue]
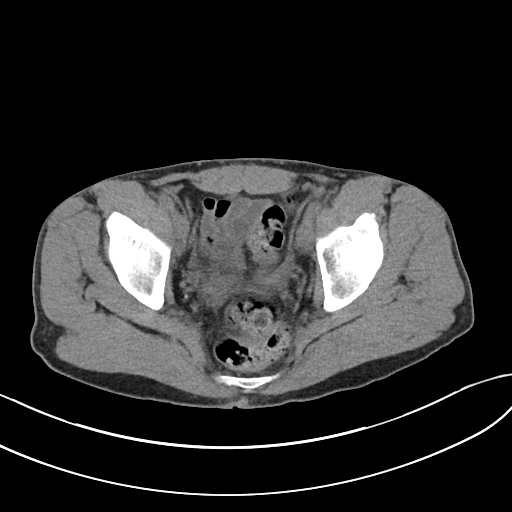
[im 29/86  soft-tissue]
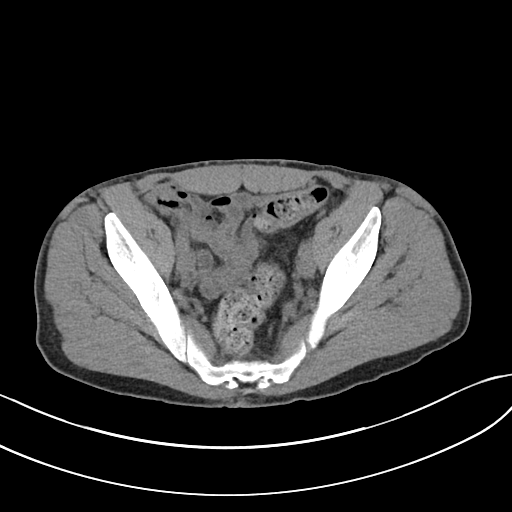
[im 37/86  soft-tissue]
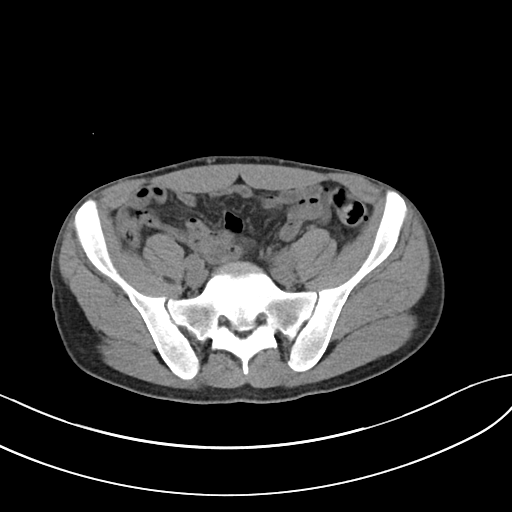
[im 45/86  soft-tissue]
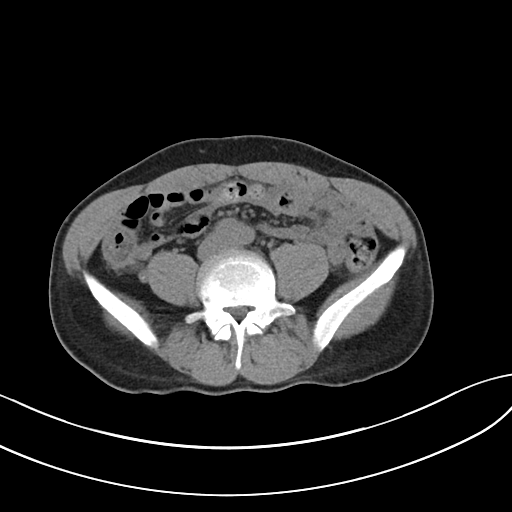
[im 49/86  soft-tissue]
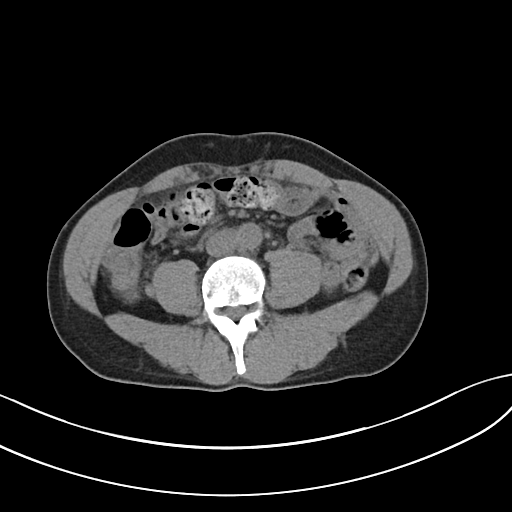
[im 57/86  soft-tissue]
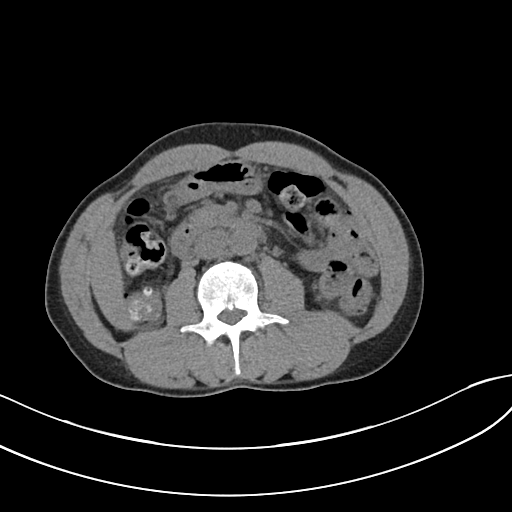
[im 57/86  bone]
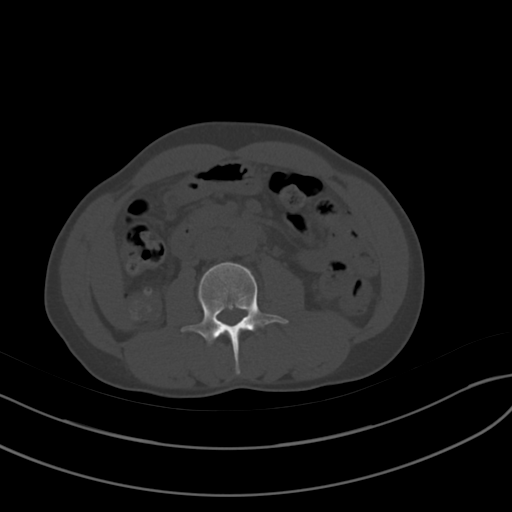
[im 61/86  soft-tissue]
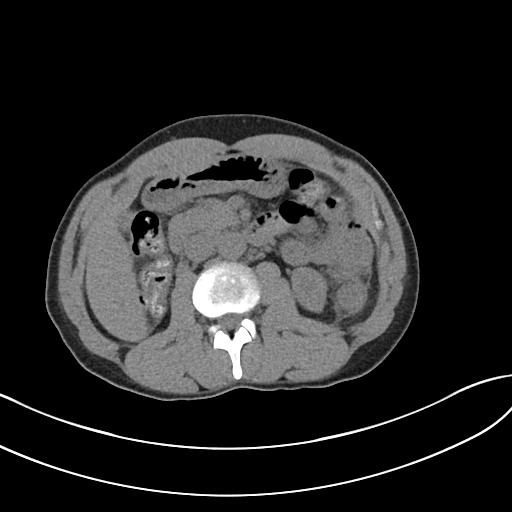
[im 69/86  soft-tissue]
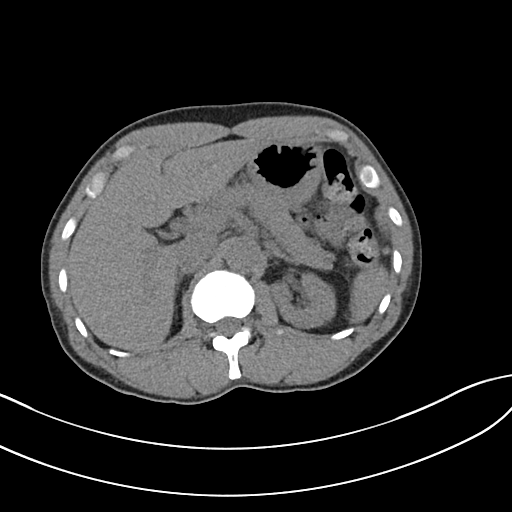
[im 73/86  soft-tissue]
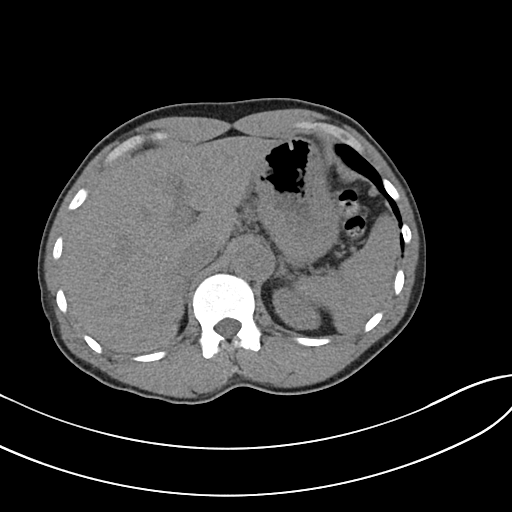
[im 81/86  soft-tissue]
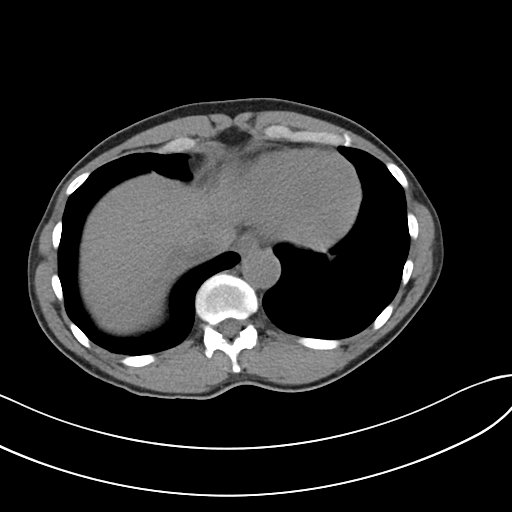

[Series 6: cor · coronal · 0.75mm/px · 3 of 131 slices shown]
[im 44/131  soft-tissue]
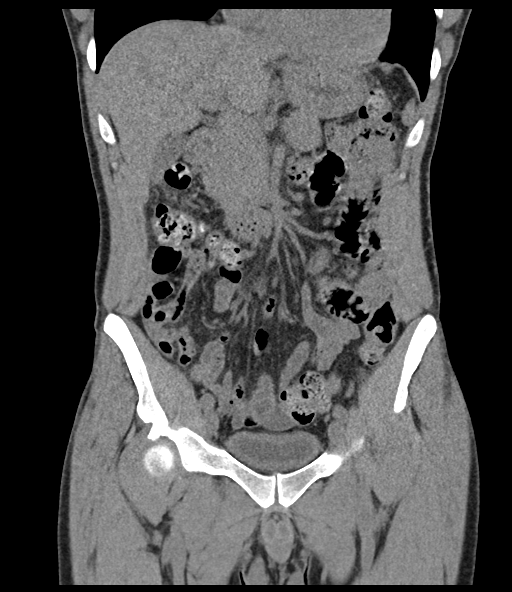
[im 58/131  soft-tissue]
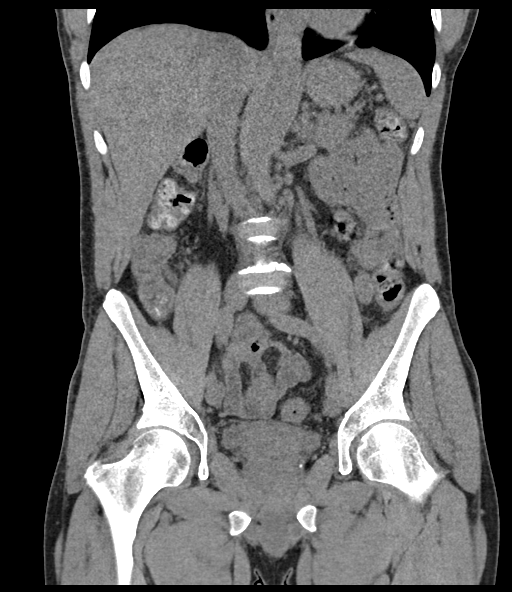
[im 73/131  soft-tissue]
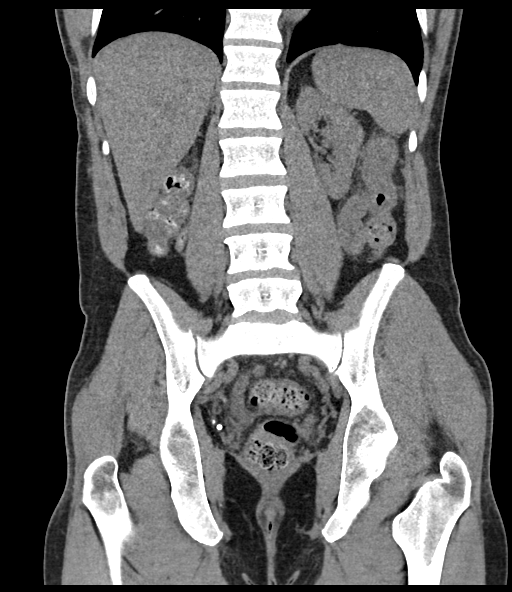

[16 of 46 positions shown; findings below may reference images not displayed]

FINDINGS: Lower chest: No acute abnormality.

Hepatobiliary: No focal liver abnormality is seen. No gallstones,
gallbladder wall thickening, or biliary dilatation.

Pancreas: Unremarkable.

Spleen: Unremarkable.

Adrenals/Urinary Tract: Adrenals are unremarkable. Right kidney is
absent. No left renal calculi or hydronephrosis. Circumferential
bladder wall thickening.

Stomach/Bowel: Stomach is within normal limits. Bowel is normal in
caliber. Question wall thickening along the proximal descending
colon with mild infiltration of adjacent fat noted. Moderate stool
throughout the colon. Normal appendix.

Vascular/Lymphatic: No significant vascular abnormality on this
noncontrast study.

Reproductive: Unremarkable.

Other: Abdominal wall is unremarkable.

Musculoskeletal: No acute osseous abnormality.
IMPRESSION: No urinary tract calculi or hydronephrosis. Circumferential bladder
wall thickening is probably due to under distension. Correlate for
cystitis.

Question of wall thickening along the proximal descending colon,
which could be due to under distension. However, there is a mild
infiltration of the adjacent fat raising possibility of colitis.

## 2023-12-21 ENCOUNTER — Ambulatory Visit: Attending: Cardiology | Admitting: Cardiology

## 2023-12-21 ENCOUNTER — Encounter: Payer: Self-pay | Admitting: Cardiology

## 2023-12-21 VITALS — BP 138/93 | HR 106 | Resp 16 | Ht 68.0 in | Wt 137.8 lb

## 2023-12-21 DIAGNOSIS — Z992 Dependence on renal dialysis: Secondary | ICD-10-CM

## 2023-12-21 DIAGNOSIS — I5042 Chronic combined systolic (congestive) and diastolic (congestive) heart failure: Secondary | ICD-10-CM | POA: Diagnosis not present

## 2023-12-21 DIAGNOSIS — I428 Other cardiomyopathies: Secondary | ICD-10-CM

## 2023-12-21 DIAGNOSIS — I5032 Chronic diastolic (congestive) heart failure: Secondary | ICD-10-CM | POA: Diagnosis not present

## 2023-12-21 DIAGNOSIS — I130 Hypertensive heart and chronic kidney disease with heart failure and stage 1 through stage 4 chronic kidney disease, or unspecified chronic kidney disease: Secondary | ICD-10-CM

## 2023-12-21 DIAGNOSIS — N186 End stage renal disease: Secondary | ICD-10-CM

## 2023-12-21 MED ORDER — CARVEDILOL 25 MG PO TABS: 25.0000 mg | ORAL_TABLET | Freq: Two times a day (BID) | ORAL | 3 refills | Status: AC

## 2023-12-21 NOTE — Patient Instructions (Addendum)
 Medication Instructions:  Increase Carvedilol  to 25 mg twice a day. HOLD if systolic blood pressure (top number) is less than 100 and/ or heart rate is less than 55.   *If you need a refill on your cardiac medications before your next appointment, please call your pharmacy*  Lab Work: None If you have labs (blood work) drawn today and your tests are completely normal, you will receive your results only by: MyChart Message (if you have MyChart) OR A paper copy in the mail If you have any lab test that is abnormal or we need to change your treatment, we will call you to review the results.  Testing/Procedures: Your physician has requested that you have an echocardiogram in June of 2026. Echocardiography is a painless test that uses sound waves to create images of your heart. It provides your doctor with information about the size and shape of your heart and how well your heart's chambers and valves are working. This procedure takes approximately one hour. There are no restrictions for this procedure. Please do NOT wear cologne, perfume, aftershave, or lotions (deodorant is allowed). Please arrive 15 minutes prior to your appointment time.  Please note: We ask at that you not bring children with you during ultrasound (echo/ vascular) testing. Due to room size and safety concerns, children are not allowed in the ultrasound rooms during exams. Our front office staff cannot provide observation of children in our lobby area while testing is being conducted. An adult accompanying a patient to their appointment will only be allowed in the ultrasound room at the discretion of the ultrasound technician under special circumstances. We apologize for any inconvenience.   Follow-Up: 1 year follow up. At [facility], you and your health needs are our priority.  As part of our continuing mission to provide you with exceptional heart care, our providers are all part of one team.  This team includes  your primary Cardiologist (physician) and Advanced Practice Providers or APPs (Physician Assistants and Nurse Practitioners) who all work together to provide you with the care you need, when you need it.  Your next appointment:   July of 2026.  Provider:   Madonna Large, DO   We recommend signing up for the patient portal called MyChart.  Sign up information is provided on this After Visit Summary.  MyChart is used to connect with patients for Virtual Visits (Telemedicine).  Patients are able to view lab/test results, encounter notes, upcoming appointments, etc.  Non-urgent messages can be sent to your provider as well.   To learn more about what you can do with MyChart, go to [email or web address].

## 2023-12-21 NOTE — Progress Notes (Signed)
 Cardiology Office Note:  .   Date:  12/21/2023  ID:  Phillip Lynch, DOB 07/25/1965, MRN [id number] PCP:  Patient, [name]  Former Cardiology Providers: None [facility] Providers Cardiologist:  Madonna Large, DO , [facility] (established care 05/07/2021) Electrophysiologist:  None  Advanced heart failure specialist: Dr. Cherrie  Click to update primary MD,subspecialty MD or APP then REFRESH:1}    Chief Complaint  Patient presents with   Chronic combined systolic and diastolic heart failure   Follow-up    History of Present Illness: Phillip Lynch is a 58 y.o. African-American male whose past medical history and cardiovascular risk factors includes: Nonischemic cardiomyopathy, chronic combined systolic and diastolic heart failure, stage C, NYHA class II, influenza A infection, history of solitary kidney, hypertension with end-stage renal disease on hemodialysis, history of polysubstance abuse.   In November 2022 he presented with flulike symptoms and was diagnosed with influenza A.  During the hospitalization echocardiogram noted severely reduced LVEF with concerns for possible cardiac amyloidosis.  However, given the worsening renal function pending hemodialysis invasive angiography was not pursued due to concerns for CIN.  He was evaluated by advanced heart failure specialist Dr. Cherrie and underwent appropriate amyloidosis workup.    Initially uptitration of GDMT was difficult due to renal function and orthostasis.  However after starting hemodialysis patient's blood pressures have been well-controlled and he did undergo angiography and was noted to have no obstructive CAD.  With up titration of medical therapy patient LVEF has improved to 40-45%.  Carvedilol  dose was increased to 12.5 mg p.o. twice daily at the last office visit.  Patient presents today for 1.5-year follow-up visit.  Patient denies anginal chest pain or heart failure symptoms. No hospitalizations for  congestive heart failure since last office visit. Continues to go to dialysis regularly, last visit earlier today. Despite being postdialysis patient has systolic blood pressures are 138 mmHg. Does not check his blood pressures at home, on nondialysis days. Overall functional capacity remains a stable  Review of Systems: .   Review of Systems  Cardiovascular:  Negative for chest pain, claudication, irregular heartbeat, leg swelling, near-syncope, orthopnea, palpitations, paroxysmal nocturnal dyspnea and syncope.  Respiratory:  Negative for shortness of breath.   Hematologic/Lymphatic: Negative for bleeding problem.    Studies Reviewed:   EKG: EKG Interpretation Date/Time:  Monday December 21 2023 14:09:21 EDT Ventricular Rate:  103 PR Interval:  160 QRS Duration:  80 QT Interval:  376 QTC Calculation: 492 R Axis:   113  Text Interpretation: Sinus tachycardia Left posterior fascicular block When compared with ECG of 25-Aug-2022 18:07, Left posterior fascicular block is now Present Confirmed by Large Madonna [phone]) on 12/21/2023 2:38:02 PM  Echocardiogram: 05/07/2021:  1. Left ventricular ejection fraction, by estimation, is 20 to 25%. The  left ventricle has severely decreased function. The left ventricle  demonstrates global hypokinesis. The left ventricular internal cavity size  was mildly dilated. There is moderate  left ventricular hypertrophy. Left ventricular diastolic parameters are  consistent with Grade I diastolic dysfunction (impaired relaxation). The  average left ventricular global longitudinal strain is -9.0 %. The global  longitudinal strain is abnormal.  Strain is relatively preserved at the apical cap. Bright myocardium with  LVH and relatively preserved apical strain suggests the possibility of  cardiac amyloidosis.   2. Right ventricular systolic function is normal. The right ventricular  size is normal. There is normal pulmonary artery systolic pressure. The   estimated right ventricular systolic  pressure is 15.8 mmHg.   3. The mitral valve is normal in structure. Trivial mitral valve  regurgitation. No evidence of mitral stenosis.   4. The aortic valve is tricuspid. Aortic valve regurgitation is mild. No  aortic stenosis is present.   5. Aortic dilatation noted. 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   6. The inferior vena cava is normal in size with greater than 50%  respiratory variability, suggesting right atrial pressure of 3 mmHg.     08/26/2022:  1. Left ventricular ejection fraction, by estimation, is 45 to 50%. The  left ventricle has mildly decreased function. The left ventricle  demonstrates global hypokinesis. There is moderate concentric left  ventricular hypertrophy of the inferior segment.  Left ventricular diastolic parameters are indeterminate. AMYLI Score  elevated.   2. Right ventricular systolic function is normal. The right ventricular  size is normal.   3. Left atrial size was severely dilated.   4. The mitral valve is normal in structure. Trivial mitral valve  regurgitation. No evidence of mitral stenosis.   5. The aortic valve is tricuspid. Aortic valve regurgitation is trivial.  No aortic stenosis is present.   6. The inferior vena cava is normal in size with greater than 50%  respiratory variability, suggesting right atrial pressure of 3 mmHg.   7. Cannot exclude a small PFO.   Comparison(s): Prior images reviewed side by side. LV function has  improved.    Stress Testing: No results found for this or any previous visit from the past 1095 days.   Heart Catheterization: 02/18/2022:   There is mild left ventricular systolic dysfunction.   LV end diastolic pressure is normal.   The left ventricular ejection fraction is 45-50% by visual estimate.   There is no mitral valve regurgitation.   Right & Left Heart Catheterization 02/18/22:  LV: 147/1, EDP 11 mmHg.  Ao 145/82, mean 112  mmHg.  No pressure gradient across the aortic valve. LVEF 45% with global hypokinesis.  No significant mitral regurgitation. LM: Large-caliber vessel, normal. LAD: Gives origin to large D1 and D2.  The LAD wraps around the apex and supplies most of the posterior wall, type IV LAD.  It is smooth and normal. LCx: Moderate caliber vessel, gives origin to small PDA branch.  2 small marginal branches.  Smooth and normal. RI: Moderate caliber vessel, smooth and normal.   RA 13/12, mean 12 mmHg RV 29/7, EDP 12 mmHg PA 29/12, mean 24 mmHg. PW 24/21, mean 18 mmHg.  Ao saturation 97%, PA saturation 69%.  DP/+1.00 and CO 5.31, CI 3.01, normal.   Impression: Findings consistent with nonischemic cardiomyopathy with mild LV systolic dysfunction.  Preserved cardiac output and cardiac index without evidence of pulmonary hypertension.  No intracardiac shunting.  35 mill contrast utilized.     PYP Study  05/10/2021: Visual and quantitative assessment (grade 1, H/CLL equal 1.22) are equivocally suggestive of transthyretin amyloidosis.  RADIOLOGY: None  Risk Assessment/Calculations:   NA   Labs:       Latest Ref Rng & Units 08/27/2022    1:12 AM 08/26/2022    1:01 AM 08/25/2022    6:25 PM  CBC  WBC 4.0 - 10.5 K/uL 5.2  3.7    Hemoglobin 13.0 - 17.0 g/dL 7.0  7.5  8.5   Hematocrit 39.0 - 52.0 % 20.7  23.3  26.4   Platelets 150 - 400 K/uL 104  112  Latest Ref Rng & Units 08/27/2022    1:12 AM 08/26/2022    1:01 AM 08/25/2022    1:04 AM  BMP  Glucose 70 - 99 mg/dL 86  852  93   BUN 6 - 20 mg/dL 86  56  62   Creatinine 0.61 - 1.24 mg/dL 84.63  87.98  85.40   Sodium 135 - 145 mmol/L 138  136  137   Potassium 3.5 - 5.1 mmol/L 4.2  4.9  3.6   Chloride 98 - 111 mmol/L 95  97  98   CO2 22 - 32 mmol/L 25  29  28    Calcium  8.9 - 10.3 mg/dL 8.8  8.1  8.6       Latest Ref Rng & Units 08/27/2022    1:12 AM 08/26/2022    1:01 AM 08/25/2022    1:04 AM  CMP  Glucose 70 - 99 mg/dL 86  852  93    BUN 6 - 20 mg/dL 86  56  62   Creatinine 0.61 - 1.24 mg/dL 84.63  87.98  85.40   Sodium 135 - 145 mmol/L 138  136  137   Potassium 3.5 - 5.1 mmol/L 4.2  4.9  3.6   Chloride 98 - 111 mmol/L 95  97  98   CO2 22 - 32 mmol/L 25  29  28    Calcium  8.9 - 10.3 mg/dL 8.8  8.1  8.6     Lab Results  Component Value Date   CHOL 151 05/07/2021   HDL 66 05/07/2021   LDLCALC 58 05/07/2021   TRIG 135 05/07/2021   CHOLHDL 2.3 05/07/2021   No results for input(s): LIPOA in the last 8760 hours. No components found for: NTPROBNP No results for input(s): PROBNP in the last 8760 hours. No results for input(s): TSH in the last 8760 hours.  Physical Exam:    Today's Vitals   12/21/23 1406  BP: (!) 138/93  Pulse: (!) 106  Resp: 16  SpO2: 95%  Weight: 137 lb 12.8 oz (62.5 kg)  Height: 5' 8 (1.727 m)   Body mass index is 20.95 kg/m. Wt Readings from Last 3 Encounters:  12/21/23 137 lb 12.8 oz (62.5 kg)  03/23/23 137 lb (62.1 kg)  09/19/22 147 lb (66.7 kg)    Physical Exam  Constitutional: No distress.  hemodynamically stable  Neck: No JVD present.  Cardiovascular: Normal rate, regular rhythm, S1 normal and S2 normal. Exam reveals no gallop, no S3 and no S4.  Murmur heard. Dialysis catheter left upper arm,   Pulmonary/Chest: Effort normal and breath sounds normal. No stridor. He has no wheezes. He has no rales.  Musculoskeletal:        General: No edema.     Cervical back: Neck supple.  Skin: Skin is warm.   Impression & Recommendation(s):  Impression:   ICD-10-CM   1. Chronic combined systolic and diastolic heart failure (HCC)  P49.57 EKG 12-Lead    2. Nonischemic cardiomyopathy (HCC)  I42.8     3. 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 Stage C, NYHA class I/II Echo  November 2022: LVEF 20-25% Echo March 2024: LVEF 45-50% Volume management per dialysis. Currently on amlodipine  10 mg p.o. daily. Increase carvedilol  to 12.5 mg p.o. twice daily to 25 mg p.o. twice daily.  Continue losartan  25 mg p.o. 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 Patient is asked to check his blood pressures on nondialysis days as well to get a better assessment of his BP. Reemphasized importance of low-salt diet  ESRD (end stage renal disease) on dialysis (HCC) Very compliant. Last session earlier today Continues to follow with nephrology.   Orders Placed:  Orders Placed This Encounter  Procedures   EKG 12-Lead     Final Medication List:   No orders of the defined types were placed in this encounter.   Medications Discontinued During This Encounter  Medication Reason   calcitRIOL  (ROCALTROL ) 0.5 MCG capsule Patient Preference   oxyCODONE  (OXY IR/ROXICODONE ) 5 MG immediate release tablet Patient Preference     Current Outpatient Medications:    amLODipine  (NORVASC ) 10 MG tablet, Take 1 tablet (10 mg total) by mouth daily., Disp: 90 tablet, Rfl: 3   carvedilol  (COREG ) 12.5 MG tablet, Take 1 tablet (12.5 mg total) by mouth 2 (two) times daily., Disp: 180 tablet, Rfl: 0   losartan  (COZAAR ) 25 MG tablet, Take 25 mg by mouth daily., Disp: , Rfl:    SENSIPAR  30 MG tablet, Take 30 mg by mouth daily with supper., Disp: , Rfl:    sucroferric oxyhydroxide (VELPHORO ) 500 MG chewable tablet, Chew 500 mg by mouth 3 (three) times daily with meals., Disp: , Rfl:   Consent:   NA  Disposition:   1 year follow-up sooner if needed  His questions and concerns were addressed to his satisfaction. He voices understanding of the recommendations provided during this encounter.    Signed, Madonna Michele HAS, [facility]  A Division of [facility] [address], [place], [place] [address]  [place],  [id number]

## 2023-12-25 ENCOUNTER — Encounter: Payer: Self-pay | Admitting: Cardiology

## 2024-01-21 ENCOUNTER — Other Ambulatory Visit: Payer: Self-pay

## 2024-01-21 ENCOUNTER — Ambulatory Visit (HOSPITAL_COMMUNITY)
Admission: RE | Admit: 2024-01-21 | Discharge: 2024-01-21 | Disposition: A | Discharge status: Home / Self Care | Attending: Vascular Surgery | Admitting: Vascular Surgery

## 2024-01-21 ENCOUNTER — Encounter (HOSPITAL_COMMUNITY)
Admission: RE | Disposition: A | Discharge status: Home / Self Care | Payer: Self-pay | Source: Home / Self Care | Attending: Vascular Surgery

## 2024-01-21 DIAGNOSIS — Z992 Dependence on renal dialysis: Secondary | ICD-10-CM | POA: Diagnosis not present

## 2024-01-21 DIAGNOSIS — N186 End stage renal disease: Secondary | ICD-10-CM | POA: Diagnosis not present

## 2024-01-21 DIAGNOSIS — T82858A Stenosis of vascular prosthetic devices, implants and grafts, initial encounter: Secondary | ICD-10-CM | POA: Insufficient documentation

## 2024-01-21 DIAGNOSIS — T82856A Stenosis of peripheral vascular stent, initial encounter: Secondary | ICD-10-CM | POA: Diagnosis not present

## 2024-01-21 DIAGNOSIS — T82838A Hemorrhage of vascular prosthetic devices, implants and grafts, initial encounter: Secondary | ICD-10-CM | POA: Diagnosis not present

## 2024-01-21 DIAGNOSIS — Y832 Surgical operation with anastomosis, bypass or graft as the cause of abnormal reaction of the patient, or of later complication, without mention of misadventure at the time of the procedure: Secondary | ICD-10-CM | POA: Diagnosis not present

## 2024-01-21 DIAGNOSIS — T82898A Other specified complication of vascular prosthetic devices, implants and grafts, initial encounter: Secondary | ICD-10-CM

## 2024-01-21 SURGERY — A/V SHUNT INTERVENTION
Anesthesia: LOCAL | Site: Arm Upper | Laterality: Left

## 2024-01-21 MED ORDER — LIDOCAINE HCL (PF) 1 % IJ SOLN
INTRAMUSCULAR | Status: DC | PRN
  Administered 2024-01-21: 2 mL via SUBCUTANEOUS

## 2024-01-21 MED ORDER — HEPARIN (PORCINE) IN NACL 1000-0.9 UT/500ML-% IV SOLN
INTRAVENOUS | Status: DC | PRN
  Administered 2024-01-21: 500 mL

## 2024-01-21 MED ORDER — IODIXANOL 320 MG/ML IV SOLN
INTRAVENOUS | Status: DC | PRN
  Administered 2024-01-21: 25 mL via INTRAVENOUS

## 2024-01-21 MED ORDER — LIDOCAINE HCL (PF) 1 % IJ SOLN
INTRAMUSCULAR | Status: AC
  Filled 2024-01-21: qty 30

## 2024-01-21 SURGICAL SUPPLY — 10 items
BALLOON MUSTANG 7X60X75 (BALLOONS) IMPLANT
BALLOON MUSTANG 9X80X75 (BALLOONS) IMPLANT
KIT ENCORE 26 ADVANTAGE (KITS) IMPLANT
KIT MICROPUNCTURE NIT STIFF (SHEATH) IMPLANT
SHEATH PINNACLE R/O II 6F 4CM (SHEATH) IMPLANT
SHEATH PROBE COVER 6X72 (BAG) IMPLANT
STOPCOCK MORSE 400PSI 3WAY (MISCELLANEOUS) IMPLANT
TRAY PV CATH (CUSTOM PROCEDURE TRAY) ×2 IMPLANT
TUBING CIL FLEX 10 FLL-RA (TUBING) IMPLANT
WIRE BENTSON .035X145CM (WIRE) IMPLANT

## 2024-01-21 NOTE — Op Note (Signed)
    Patient name: Phillip Lynch MRN: [id number] DOB: [birth year] Sex: male  01/21/2024 Pre-operative Diagnosis: ESRD Post-operative diagnosis:  Same Surgeon:  Malvina New Procedure Performed:  1.  Ultrasound-guided access, left arm fistula  2.  Fistulogram  3.  Balloon venoplasty, left basilic vein (peripheral vein)  Indications: 58-year-old gentleman with prolonged bleeding after dialysis.  He comes in today for further evaluation and possible intervention.  Procedure:  The patient was identified in the holding area and taken to room 8.  The patient was then placed supine on the table and prepped and draped in the usual sterile fashion.  A time out was called.  Ultrasound was used to evaluate the fistula.  The vein was patent and compressible.  A digital ultrasound image was acquired.  The fistula was then accessed under ultrasound guidance using a micropuncture needle.  An 018 wire was then asvanced without resistance and a micropuncture sheath was placed.  Contrast injections were then performed through the sheath.  Findings: No evidence of central venous stenosis.  The arteriovenous anastomosis is widely patent.  There is a stent up in the axilla but is widely patent.  In the midportion of the fistula it is ectatic however there is a transition between this ectatic area and the stent that is narrowed, greater than 50%   Intervention: After the above images were acquired the decision was made to proceed with intervention.  Over a Bentson wire a 6 French sheath was placed.  I first performed primary balloon venoplasty using a 7 x 60 Mustang balloon.  Follow-up imaging showed improved but suboptimal results and so I upsized to a 9 x 80 Mustang balloon and repeated balloon venoplasty.  Follow-up imaging revealed resolution of the stenosis.  Balloon and wires were removed and the sheath was removed with a Monocryl stitch for closure.  Impression:  #1  Successful balloon venoplasty of a mid fistula  stenosis using a 9 mm balloon  #2  Fistula mains amenable to future interventions    V. Malvina New, M.D., [facility] [place] Office: [phone] Pager:  [phone]

## 2024-01-21 NOTE — H&P (Signed)
   Patient name: Phillip Lynch MRN: [id number] DOB: 1966/01/27 Sex: male  REASON FOR VISIT:    Bleeding after dialysis  HISTORY OF PRESENT ILLNESS:   Phillip Lynch is a 58 y.o. male who is status post left brachiocephalic fistula on 05/13/2021.  He then went on to have a left basilic vein fistula on 05/20/2022.  This had to be revised secondary to infiltration in March 2024.  He reports having prolonged bleeding after dialysis and fistulogram has been recommended.  CURRENT MEDICATIONS:    No current facility-administered medications for this encounter.    REVIEW OF SYSTEMS:   [X]  denotes positive finding, [ ]  denotes negative finding Cardiac  Comments:  Chest pain or chest pressure:    Shortness of breath upon exertion:    Short of breath when lying flat:    Irregular heart rhythm:    Constitutional    Fever or chills:      PHYSICAL EXAM:   Vitals:   01/21/24 0709  BP: (!) 160/97  Pulse: 89  Resp: 14  Temp: 98.6 F (37 C)  TempSrc: Oral  SpO2: 98%    GENERAL: The patient is a well-nourished male, in no acute distress. The vital signs are documented above. CARDIOVASCULAR: There is a regular rate and rhythm. PULMONARY: Non-labored respirations Excellent thrill within left arm fistula.  Slight aneurysmal change  STUDIES:      MEDICAL ISSUES:   ESRD: We discussed proceeding with left upper extremity fistulogram.  If a stenosis is identified, this would be treated appropriately.  All questions were answered and he wishes to proceed.  Malvina Serene CLORE, MD, FACS [facility] [phone] Pager [phone]

## 2024-01-21 NOTE — Discharge Instructions (Signed)
 SABRA

## 2024-01-22 ENCOUNTER — Encounter (HOSPITAL_COMMUNITY): Payer: Self-pay | Admitting: Surgery

## 2024-02-08 ENCOUNTER — Encounter (HOSPITAL_COMMUNITY): Payer: Self-pay | Admitting: *Deleted

## 2024-02-08 ENCOUNTER — Other Ambulatory Visit: Payer: Self-pay

## 2024-02-08 ENCOUNTER — Emergency Department (HOSPITAL_COMMUNITY)
Admission: EM | Admit: 2024-02-08 | Discharge: 2024-02-08 | Disposition: A | Discharge status: Home / Self Care | Attending: Emergency Medicine | Admitting: Emergency Medicine

## 2024-02-08 DIAGNOSIS — Z992 Dependence on renal dialysis: Secondary | ICD-10-CM | POA: Diagnosis not present

## 2024-02-08 DIAGNOSIS — R319 Hematuria, unspecified: Secondary | ICD-10-CM | POA: Insufficient documentation

## 2024-02-08 DIAGNOSIS — I132 Hypertensive heart and chronic kidney disease with heart failure and with stage 5 chronic kidney disease, or end stage renal disease: Secondary | ICD-10-CM | POA: Diagnosis not present

## 2024-02-08 DIAGNOSIS — N186 End stage renal disease: Secondary | ICD-10-CM | POA: Diagnosis not present

## 2024-02-08 DIAGNOSIS — I5089 Other heart failure: Secondary | ICD-10-CM | POA: Diagnosis not present

## 2024-02-08 DIAGNOSIS — Z79899 Other long term (current) drug therapy: Secondary | ICD-10-CM | POA: Insufficient documentation

## 2024-02-08 NOTE — ED Provider Notes (Signed)
 [place] EMERGENCY DEPARTMENT AT [facility] Provider Note   CSN: [id number] Arrival date & time: 02/08/24  9465     Patient presents with: Hematuria   Phillip Lynch is a 58 y.o. male.   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  Patient states he was using the restroom this morning, felt a sharp pain at the base of his penis, then noticed a moderate amount of bloody urine. Reports blood was dripping out afterwards for a while but then stopped.  Currently asymptomatic.  He typically does not produce urine. He gets dialysis on MWF, was getting ready to go this morning when he noticed the hematuria.  Denies fevers, abdominal pain, flank pain, testicular pain, recent injury or catheterization, CP/SOB. Is not currently sexually active, no recent hx of STDs. No other abnormal urethral discharge. Denies hx of kidney stone.   Hematuria        Prior to Admission medications   Medication Sig Start Date End Date Taking? Authorizing Provider  amLODipine  (NORVASC ) 10 MG tablet Take 1 tablet (10 mg total) by mouth daily. 03/23/23   Wyn Jackee VEAR Mickey., NP  carvedilol  (COREG ) 25 MG tablet Take 1 tablet (25 mg total) by mouth 2 (two) times daily. Hold if systolic blood pressure (top number) is less than 100 and/or heart rate is less than 55. 12/21/23   Tolia, Sunit, DO  losartan  (COZAAR ) 25 MG tablet Take 25 mg by mouth daily. 04/14/22   [provider]  SENSIPAR  30 MG tablet Take 30 mg by mouth daily with supper. 10/07/21   [provider]  sucroferric oxyhydroxide (VELPHORO ) 500 MG chewable tablet Chew 500 mg by mouth 3 (three) times daily with meals.    [provider]    Allergies: Nsaids    Review of Systems  Constitutional:  Negative for fever.  Genitourinary:  Positive for hematuria.    Updated Vital Signs BP (!)  170/95 (BP Location: Right Arm)   Pulse 90   Temp 97.9 F (36.6 C)   Resp 16   Ht 5' 8 (1.727 m)   Wt 62.6 kg   SpO2 97%   BMI 20.98 kg/m   Physical Exam Vitals and nursing note reviewed.  Constitutional:      General: He is not in acute distress.    Appearance: He is well-developed.  HENT:     Head: Normocephalic and atraumatic.  Eyes:     Conjunctiva/sclera: Conjunctivae normal.  Cardiovascular:     Rate and Rhythm: Normal rate and regular rhythm.     Heart sounds: No murmur heard.    Comments: LUE AV fistula intact Pulmonary:     Effort: Pulmonary effort is normal. No respiratory distress.     Breath sounds: Normal breath sounds.  Abdominal:     General: Abdomen is flat. There is no distension.     Palpations: Abdomen is soft.     Tenderness: There is no abdominal tenderness. There is no right CVA tenderness, left CVA tenderness, guarding or rebound.  Genitourinary:    Penis: Normal and circumcised. No tenderness, discharge or swelling.      Testes: Normal.        Right: Tenderness not present.        Left: Tenderness not present.     Comments: Dried blood on glans around urethral orifice  Musculoskeletal:  General: No swelling.     Cervical back: Neck supple.  Skin:    General: Skin is warm and dry.     Capillary Refill: Capillary refill takes less than 2 seconds.  Neurological:     Mental Status: He is alert.  Psychiatric:        Mood and Affect: Mood normal.     (all labs ordered are listed, but only abnormal results are displayed) Labs Reviewed - No data to display  EKG: None  Radiology: No results found.   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  Does not typically produce urine, on  dialysis MWF, did not go today due to this concern.  Differential includes renal versus ureteral stone, less likely cystitis, urethritis, nephritic syndrome/glomerular nephropathy, coagulopathy, malignancy. No recent history of catheterization or injury to suggest traumatic hematuria. No recent sexual activity or recent hx STI, lower suspicion for STI.  Afebrile, hemodynamically stable, exam is unremarkable aside from dried blood noted around the urethral orifice.  Given resolution of symptoms suspect he likely passed a small stone.  Do not feel that further testing or imaging is necessary at this time.  Feel he is stable for discharge and should continue dialysis as scheduled.  Discussed return precautions.        Final diagnoses:  Hematuria, unspecified type    ED Discharge Orders     None          Romelle Booty, MD 02/08/24 0817    Pamella Ozell LABOR, DO 02/10/24 1634

## 2024-02-08 NOTE — ED Triage Notes (Signed)
 Patient states he was getting ready to go to dialysis and felt a sharp pain in his penis and noticed blood and then it was a small drip for a while , now stopped, states he doesn't normally pass urine.

## 2024-02-08 NOTE — Discharge Instructions (Addendum)
 It is possible that you passed a small stone which caused your bloody urine. Unless this continues, further testing is not necessary. Please stay well hydrated and seek medical attention if your bleeding worsens or you have fevers, abdominal or flank pain, dizziness, lightheadedness, or passing out.  Please continue your dialysis as scheduled.

## 2024-02-11 ENCOUNTER — Other Ambulatory Visit: Payer: Self-pay | Admitting: Nephrology

## 2024-02-11 DIAGNOSIS — N200 Calculus of kidney: Secondary | ICD-10-CM

## 2024-02-16 ENCOUNTER — Ambulatory Visit
Admission: RE | Admit: 2024-02-16 | Discharge: 2024-02-16 | Disposition: A | Discharge status: Home / Self Care | Source: Ambulatory Visit | Attending: Nephrology

## 2024-02-16 DIAGNOSIS — N200 Calculus of kidney: Secondary | ICD-10-CM

## 2024-03-22 ENCOUNTER — Encounter: Payer: Self-pay | Admitting: Family Medicine

## 2024-03-22 ENCOUNTER — Ambulatory Visit (INDEPENDENT_AMBULATORY_CARE_PROVIDER_SITE_OTHER): Admitting: Family Medicine

## 2024-03-22 VITALS — BP 122/80 | HR 96 | Ht 69.0 in | Wt 140.2 lb

## 2024-03-22 DIAGNOSIS — Z1211 Encounter for screening for malignant neoplasm of colon: Secondary | ICD-10-CM | POA: Diagnosis not present

## 2024-03-22 DIAGNOSIS — L821 Other seborrheic keratosis: Secondary | ICD-10-CM

## 2024-03-22 DIAGNOSIS — N186 End stage renal disease: Secondary | ICD-10-CM

## 2024-03-22 DIAGNOSIS — I10 Essential (primary) hypertension: Secondary | ICD-10-CM

## 2024-03-22 DIAGNOSIS — Z23 Encounter for immunization: Secondary | ICD-10-CM | POA: Diagnosis not present

## 2024-03-22 DIAGNOSIS — G8929 Other chronic pain: Secondary | ICD-10-CM | POA: Diagnosis not present

## 2024-03-22 DIAGNOSIS — Z992 Dependence on renal dialysis: Secondary | ICD-10-CM

## 2024-03-22 DIAGNOSIS — M25511 Pain in right shoulder: Secondary | ICD-10-CM

## 2024-03-22 MED ORDER — LOSARTAN POTASSIUM 25 MG PO TABS: 25.0000 mg | ORAL_TABLET | Freq: Every day | ORAL | 1 refills | Status: AC

## 2024-03-22 MED ORDER — AMLODIPINE BESYLATE 10 MG PO TABS: 10.0000 mg | ORAL_TABLET | Freq: Every day | ORAL | 3 refills | Status: AC

## 2024-03-22 MED ORDER — SENSIPAR 30 MG PO TABS: 30.0000 mg | ORAL_TABLET | Freq: Every day | ORAL | 1 refills | Status: AC

## 2024-03-22 NOTE — Patient Instructions (Signed)
 [address] for your xrays

## 2024-03-22 NOTE — Addendum Note (Signed)
 Addended by: Sharelle Burditt on: 03/22/2024 02:31 PM   Modules accepted: Level of Service

## 2024-03-22 NOTE — Progress Notes (Signed)
 Name: Phillip Lynch   Date of Visit: 03/22/24   Date of last visit with me: Visit date not found   CHIEF COMPLAINT:  Chief Complaint  Patient presents with   Establish Care    New patient.        HPI:  Discussed the use of AI scribe software for clinical note transcription with the patient, who gave verbal consent to proceed.  History of Present Illness   Phillip Lynch is a 58 year old male with end-stage renal disease who presents for a primary care visit.  He has been on dialysis for the past two to three years due to being born with only one kidney, which has slowly deteriorated over time. He undergoes dialysis sessions three times a week on Monday, Wednesday, and Friday. Despite his condition, he remains active and continues to work part-time at [employer]. Dialysis is draining, but he tries to stay active to maintain his health.  He has a shoulder issue that began after performing incline exercises without proper warm-up. Since then, he experiences clicking and aching in the shoulder, particularly when performing certain movements like lifting weights or doing pull-ups. The pain is described as moving around the shoulder area, sometimes felt on the outside and sometimes in the front. He continues to work part-time, which involves physical activity, but he is cautious due to the shoulder pain.  He mentions a bump on his back and has noticed an increase in moles and skin changes, which he attributes to dialysis. He is concerned about these changes but does not report any significant symptoms associated with them.  He is proactive about his health and wants to undergo a colonoscopy for screening purposes. He also mentions the need to establish a winter exercise regimen to maintain his activity levels during colder months.         OBJECTIVE:       03/22/2024    9:26 AM  Depression screen PHQ 2/9  Decreased Interest 0  Down, Depressed, Hopeless 0  PHQ - 2 Score 0     BP  Readings from Last 3 Encounters:  03/22/24 122/80  02/08/24 137/86  01/21/24 (!) 152/89    BP 122/80   Pulse 96   Ht 5' 9 (1.753 m)   Wt 140 lb 3.2 oz (63.6 kg)   SpO2 97%   BMI 20.70 kg/m    Physical Exam   SKIN: Multiple skin changes resembling sunspots, identified as seborrheic keratosis.      Physical Exam  Inspection reveals no gross abnormalities of the right shoulder.  There is mild tenderness to palpation over the bicipital groove at the bicipital tendon area.  Range of motion is full though there is some pain noted with active abduction against resistance.  Hawkins negative.  Neer's negative.  Normal I will shoot you a message either today or tomorrow with the pain if that is okay? 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 Blood pressure well-controlled, remains active. - Continue current dialysis schedule.  Chronic right shoulder pain with suspected tendinitis and rotator cuff pathology Chronic right shoulder pain with suspected tendinitis and rotator cuff pathology. Differential includes biceps tendon tendinitis and rotator  cuff issues. X-ray ordered to exclude arthritis. - Order x-ray of right shoulder. - Advise low weight, high repetition exercises for tendon strengthening. - Advise against pull-ups until shoulder is healed. - Schedule follow-up in six weeks to review x-ray results and conduct Medicare wellness visit.  Seborrheic keratosis and benign skin changes Multiple seborrheic keratoses and benign skin changes, likely dialysis-related. No concerning lesions. - Monitor skin changes at each visit.         [name] A. Vita  MD [facility]

## 2024-07-06 ENCOUNTER — Encounter: Payer: Self-pay | Admitting: Gastroenterology

## 2024-07-09 ENCOUNTER — Encounter (HOSPITAL_COMMUNITY): Payer: Self-pay | Admitting: Emergency Medicine

## 2024-07-09 ENCOUNTER — Emergency Department (HOSPITAL_COMMUNITY)
Admission: EM | Admit: 2024-07-09 | Discharge: 2024-07-09 | Disposition: A | Discharge status: Home / Self Care | Attending: Emergency Medicine | Admitting: Emergency Medicine

## 2024-07-09 ENCOUNTER — Other Ambulatory Visit: Payer: Self-pay

## 2024-07-09 DIAGNOSIS — R197 Diarrhea, unspecified: Secondary | ICD-10-CM | POA: Insufficient documentation

## 2024-07-09 DIAGNOSIS — Z992 Dependence on renal dialysis: Secondary | ICD-10-CM | POA: Insufficient documentation

## 2024-07-09 DIAGNOSIS — N186 End stage renal disease: Secondary | ICD-10-CM | POA: Insufficient documentation

## 2024-07-09 LAB — COMPREHENSIVE METABOLIC PANEL WITH GFR
ALT: 16 U/L (ref 0–44)
AST: 29 U/L (ref 15–41)
Albumin: 4.1 g/dL (ref 3.5–5.0)
Alkaline Phosphatase: 65 U/L (ref 38–126)
Anion gap: 18 — ABNORMAL HIGH (ref 5–15)
BUN: 31 mg/dL — ABNORMAL HIGH (ref 6–20)
CO2: 26 mmol/L (ref 22–32)
Calcium: 8.8 mg/dL — ABNORMAL LOW (ref 8.9–10.3)
Chloride: 94 mmol/L — ABNORMAL LOW (ref 98–111)
Creatinine, Ser: 9.18 mg/dL — ABNORMAL HIGH (ref 0.61–1.24)
GFR, Estimated: 6 mL/min — ABNORMAL LOW
Glucose, Bld: 87 mg/dL (ref 70–99)
Potassium: 3.9 mmol/L (ref 3.5–5.1)
Sodium: 138 mmol/L (ref 135–145)
Total Bilirubin: 0.7 mg/dL (ref 0.0–1.2)
Total Protein: 7.5 g/dL (ref 6.5–8.1)

## 2024-07-09 LAB — CBC
HCT: 36.6 % — ABNORMAL LOW (ref 39.0–52.0)
Hemoglobin: 12.2 g/dL — ABNORMAL LOW (ref 13.0–17.0)
MCH: 33.7 pg (ref 26.0–34.0)
MCHC: 33.3 g/dL (ref 30.0–36.0)
MCV: 101.1 fL — ABNORMAL HIGH (ref 80.0–100.0)
Platelets: 107 10*3/uL — ABNORMAL LOW (ref 150–400)
RBC: 3.62 MIL/uL — ABNORMAL LOW (ref 4.22–5.81)
RDW: 14.2 % (ref 11.5–15.5)
WBC: 9.8 10*3/uL (ref 4.0–10.5)
nRBC: 0 % (ref 0.0–0.2)

## 2024-07-09 LAB — C DIFFICILE QUICK SCREEN W PCR REFLEX
C Diff antigen: NEGATIVE
C Diff interpretation: NOT DETECTED
C Diff toxin: NEGATIVE

## 2024-07-09 LAB — CBG MONITORING, ED: Glucose-Capillary: 73 mg/dL (ref 70–99)

## 2024-07-09 LAB — LIPASE, BLOOD: Lipase: 19 U/L (ref 11–51)

## 2024-07-09 MED ORDER — DIPHENOXYLATE-ATROPINE 2.5-0.025 MG PO TABS
2.0000 | ORAL_TABLET | Freq: Once | ORAL | Status: AC
  Administered 2024-07-09: 2 via ORAL
  Filled 2024-07-09: qty 2

## 2024-07-09 MED ORDER — DIPHENOXYLATE-ATROPINE 2.5-0.025 MG PO TABS: 2.0000 | ORAL_TABLET | Freq: Four times a day (QID) | ORAL | 0 refills | Status: DC | PRN

## 2024-07-09 MED ORDER — SODIUM CHLORIDE 0.9 % IV BOLUS
250.0000 mL | Freq: Once | INTRAVENOUS | Status: AC
  Administered 2024-07-09: 250 mL via INTRAVENOUS

## 2024-07-09 NOTE — ED Triage Notes (Signed)
 Per EMS. Pt from home c/o diarrhea X6 days w/ nauesa.  M/W/F dialysis pt, did get treatment on Friday.  CBG 69, given some oral glucose  134/88 HR 107 RR 16 93% RA  Pt clarified, he only got two hours of dialysis on Friday b/c his dry weight as good.  He reports today he is just unable to control his bowel movements having at least 14 today often not making it to the bathroom.  Reports some abdominal cramping earlier

## 2024-07-09 NOTE — ED Notes (Signed)
 Pt states unable to provide sample as he does not produce urine

## 2024-07-09 NOTE — ED Provider Notes (Signed)
 " Phillip Lynch   CSN: [id number] Arrival date & time: 07/09/24  9773     Patient presents with: Diarrhea and Nausea   Phillip Lynch is a 59 y.o. male.   The history is provided by the patient.  Diarrhea Phillip Lynch is a 59 y.o. male who presents to the Emergency Department complaining of diarrhea.  He presents to the emergency department for evaluation of 6 days of numerous episodes of watery diarrhea.  Today the stool became lighter and lighter now it is almost white in color.  He does not have any associated significant abdominal pain but does state there is occasional discomfort.  No fever, vomiting.  He does have associated nausea.  He reports weight loss associated with his symptoms.  He is ESRD and dialyzes Monday, Wednesday, Friday.  He only had 2 hours of his dialysis session today due to being down to his dry weight as well as needing to use the toilet.  No recent antibiotics, travel or sick contacts.  He has a history of hypertension and CHF.  No known bad food exposures.  He tried Imodium at home, which seemed to worsen his symptoms. He feels globally weak.     Prior to Admission medications  Medication Sig Start Date End Date Taking? Authorizing Provider  amLODipine  (NORVASC ) 10 MG tablet Take 1 tablet (10 mg total) by mouth daily. 03/22/24   Jha, Panav, MD  carvedilol  (COREG ) 25 MG tablet Take 1 tablet (25 mg total) by mouth 2 (two) times daily. Hold if systolic blood pressure (top number) is less than 100 and/or heart rate is less than 55. 12/21/23   Tolia, Sunit, DO  losartan  (COZAAR ) 25 MG tablet Take 1 tablet (25 mg total) by mouth daily. 03/22/24   Jha, Panav, MD  SENSIPAR  30 MG tablet Take 1 tablet (30 mg total) by mouth daily with supper. 03/22/24   Jha, Panav, MD  sucroferric oxyhydroxide (VELPHORO ) 500 MG chewable tablet Chew 500 mg by mouth 3 (three) times daily with meals.    [provider]     Allergies: Nsaids    Review of Systems  Gastrointestinal:  Positive for diarrhea.  All other systems reviewed and are negative.   Updated Vital Signs BP (!) 160/96 (BP Location: Right Arm)   Pulse (!) 108   Temp 99 F (37.2 C) (Oral)   Resp 20   Ht 5' 8 (1.727 m)   Wt 61.2 kg   SpO2 98%   BMI 20.53 kg/m   Physical Exam Vitals and nursing Lynch reviewed.  Constitutional:      Appearance: He is well-developed.  HENT:     Head: Normocephalic and atraumatic.  Cardiovascular:     Rate and Rhythm: Normal rate and regular rhythm.     Heart sounds: No murmur heard. Pulmonary:     Effort: Pulmonary effort is normal. No respiratory distress.     Breath sounds: Normal breath sounds.  Abdominal:     Palpations: Abdomen is soft.     Tenderness: There is no abdominal tenderness. There is no guarding or rebound.  Musculoskeletal:        General: No tenderness.  Skin:    General: Skin is warm and dry.  Neurological:     Mental Status: He is alert and oriented to person, place, and time.  Psychiatric:        Behavior: Behavior normal.     (all labs ordered are listed,  but only abnormal results are displayed) Labs Reviewed  LIPASE, BLOOD  COMPREHENSIVE METABOLIC PANEL WITH GFR  CBC  URINALYSIS, ROUTINE W REFLEX MICROSCOPIC  CBG MONITORING, ED    EKG: None  Radiology: No results found.   Procedures   Medications Ordered in the ED - No data to display                                  Medical Decision Making Amount and/or Complexity of Data Reviewed Labs: ordered.   Patient with history of ESRD here for evaluation of profuse diarrhea over the last 6 days.  He does have global weakness.  Labs improved when compared to priors.  CMP with appropriate electrolytes given his renal disease.  He does appear mildly dehydrated and he was treated with small IV fluid bolus.  Abdominal examination is benign, current picture is not consistent with appendicitis,  diverticulitis.  Plan to obtain C. difficile study given his risk of being a dialysis patient and frequent healthcare exposures.  Patient care transferred pending C. difficile.     Final diagnoses:  None    ED Discharge Orders     None          Griselda Norris, MD 07/09/24 [phone]  "

## 2024-07-09 NOTE — Discharge Instructions (Addendum)
 Your history, exam, workup today are consistent with diarrhea but you were negative for a C. difficile infection.  Previous team felt you were safe for discharge home given your labs and workup being overall similar to prior.  Please use the Lomotil  to help with diarrhea control and please rest and stay hydrated as best you can.  Please continue your outpatient follow-up with your PCP, dialysis team, and your GI team and may consider giving them a call to let them know about the GI changes.  If any symptoms change or worsen acutely, please return to the nearest emergency department.

## 2024-07-09 NOTE — ED Provider Notes (Signed)
 7:27 AM Care assumed to Dr. Griselda.  At time of transfer of care, patient is awaiting for results of C. difficile test.  If CT is negative, will likely do prescription for Lomotil  and discharge home, if C. difficile is positive, will likely present for antibiotics and discharge home.  Previous team suspect patient will be safe for discharge home.  9:21 AM C. difficile testing has returned and is negative.  Suspect other gastroenteritis or GI cause of his significant diarrhea.  Will give him the Lomotil  as recommended by the previous team and then p.o. challenge.  He can pass a p.o. challenge and can keep fluids down we will plan for discharge with prescription for Lomotil  and have him follow-up with his GI team and PCP with good return precautions.  Anticipate discharge shortly.   Clinical Impression: 1. Diarrhea, unspecified type     Disposition: Discharge  Condition: Good  I have discussed the results, Dx and Tx plan with the pt(& family if present). He/she/they expressed understanding and agree(s) with the plan. Discharge instructions discussed at great length. Strict return precautions discussed and pt &/or family have verbalized understanding of the instructions. No further questions at time of discharge.    New Prescriptions   DIPHENOXYLATE -ATROPINE  (LOMOTIL ) 2.5-0.025 MG TABLET    Take 2 tablets by mouth 4 (four) times daily as needed for diarrhea or loose stools.    Follow Up: Vita Morrow, MD [address] [place] [address] [phone]     your GI team        Yailene Badia, Lonni PARAS, MD 07/09/24 [phone]

## 2024-07-12 ENCOUNTER — Telehealth: Payer: Self-pay | Admitting: *Deleted

## 2024-07-12 ENCOUNTER — Telehealth: Payer: Self-pay

## 2024-07-12 NOTE — Telephone Encounter (Signed)
 POD B  Please assist with getting this patient rescheduled to the hospital Please/thank you  Also please let PV know if the patient's PV is rescheduled to a different day-thank you

## 2024-07-12 NOTE — Telephone Encounter (Signed)
 Team,  [place],  This patient has end stage renal disease and is on hemodialysis; their procedure will need to be done at the hospital.  Best regards,  Norleen EMERSON Schillings CRNA, MS

## 2024-07-12 NOTE — Telephone Encounter (Signed)
 While preparing patient chart for pre-visit appt, RN observed that patient has uncontrolled heart failure and is on dialysis. Patient's procedure must be completed at hospital. DOROTHA Schillings, CRNA has noted such in chart as well.  RN contacted patient to notify him that Dr. Ira RN will contact him to schedule his procedure at the hospital. Patient stated understanding.

## 2024-07-13 NOTE — Telephone Encounter (Signed)
 Attempted to reach patient to schedule OV .   Mailbox full. Patient needs an OV with POD B prior to being scheduled at hospital.

## 2024-07-13 NOTE — Telephone Encounter (Signed)
 Patient returned call. Patient scheduled for OV with Dr Federico 07/20/24 @ 2:30 .

## 2024-07-13 NOTE — Telephone Encounter (Signed)
 Patient will need a new patient OV in pod B before scheduling at hospital. Unable to leave message, mailbox full.   PV was canceled.

## 2024-07-13 NOTE — Telephone Encounter (Signed)
 Patient returned call.   OV with Dr Federico scheduled 07/20/24 @ 2:30.

## 2024-07-14 ENCOUNTER — Other Ambulatory Visit: Payer: Self-pay

## 2024-07-14 ENCOUNTER — Ambulatory Visit (HOSPITAL_COMMUNITY)
Admission: EM | Admit: 2024-07-14 | Discharge: 2024-07-14 | Disposition: A | Discharge status: Home / Self Care | Attending: Family Medicine | Admitting: Family Medicine

## 2024-07-14 ENCOUNTER — Encounter (HOSPITAL_COMMUNITY): Payer: Self-pay | Admitting: *Deleted

## 2024-07-14 DIAGNOSIS — R197 Diarrhea, unspecified: Secondary | ICD-10-CM | POA: Diagnosis not present

## 2024-07-14 HISTORY — DX: Chronic systolic (congestive) heart failure: I50.22

## 2024-07-14 HISTORY — DX: End stage renal disease: N18.6

## 2024-07-14 MED ORDER — DIPHENOXYLATE-ATROPINE 2.5-0.025 MG PO TABS: 2.0000 | ORAL_TABLET | Freq: Four times a day (QID) | ORAL | 0 refills | Status: AC | PRN

## 2024-07-14 NOTE — ED Notes (Signed)
 Stool collection kit given to PT. PT voices understanding on use and returning stool sample

## 2024-07-14 NOTE — Discharge Instructions (Signed)
You have had labs (stool studies) sent today. We will call you with any significant abnormalities or if there is need to begin or change treatment or pursue further follow up.  You may also review your test results online through MyChart. If you do not have a MyChart account, instructions to sign up should be on your discharge paperwork.

## 2024-07-14 NOTE — ED Provider Notes (Signed)
 " [facility]   [id number] 07/14/24 Arrival Time: 0900  ASSESSMENT & PLAN:  1. Diarrhea, unspecified type    Unclear cause of diarrhea. Recent ED visit reviewed. C diff negative. . Meds ordered this encounter  Medications   diphenoxylate -atropine  (LOMOTIL ) 2.5-0.025 MG tablet    Sig: Take 2 tablets by mouth 4 (four) times daily as needed for diarrhea or loose stools.    Dispense:  30 tablet    Refill:  0   Pending: Orders Placed This Encounter  Procedures   Gastrointestinal Panel by PCR , Stool    Standing Status:   Future    Expected Date:   07/14/2024    Expiration Date:   08/13/2024   Has colonoscopy first week of February.  Reviewed expectations re: course of current medical issues. Questions answered. Outlined signs and symptoms indicating need for more acute intervention. Patient verbalized understanding. After Visit Summary given.   SUBJECTIVE: History from: patient.  Loel Betancur is a 59 y.o. male with ESRD on MWF dialysis (went yesterday) who presents with non-bloody diarrhea. Seen in ED last week. Labs ok. C diff negative. Rx Lomotil  that helped; diarrhea eased. Early am today reports watery diarrhea. Has taken Lomotil  PTA that has helped. Denies fever/recent travel. Normal appetite. Without n/v.   Past Surgical History:  Procedure Laterality Date   A/V SHUNT INTERVENTION Left 01/21/2024   Procedure: A/V SHUNT INTERVENTION;  Surgeon: Serene Gaile ORN, MD;  Location: [facility] LAB;  Service: Cardiovascular;  Laterality: Left;   AV FISTULA PLACEMENT Left 05/13/2021   Procedure: ARTERIOVENOUS (AV) FISTULA CREATION;  Surgeon: Lanis Fonda BRAVO, MD;  Location: [facility] OR;  Service: Vascular;  Laterality: Left;   AV FISTULA PLACEMENT Left 05/20/2022   Procedure: LEFT ARM BASILIC VEIN TRANSPOSITION;  Surgeon: Eliza Lonni RAMAN, MD;  Location: [facility] OR;  Service: Vascular;  Laterality: Left;   CAPD INSERTION N/A 04/10/2022   Procedure: LAPAROSCOPIC INSERTION  CONTINUOUS AMBULATORY PERITONEAL DIALYSIS  (CAPD) CATHETER WITH OMENTOPEXY;  Surgeon: Magda Debby SAILOR, MD;  Location: [facility] OR;  Service: Vascular;  Laterality: N/A;   CAPD INSERTION  05/20/2022   Procedure: PERITONEAL DIALYSIS CATHETER REMOVAL;  Surgeon: Eliza Lonni RAMAN, MD;  Location: [facility] OR;  Service: Vascular;;   IR FLUORO GUIDE CV LINE RIGHT  07/15/2021   IR US  GUIDE VASC ACCESS RIGHT  07/16/2021   REVISON OF ARTERIOVENOUS FISTULA Left 08/25/2022   Procedure: EVACUATION HEMATOMA WITH PRIMARY REPAIR OF LEFT UPPER ARM ARTERIOVENOUS FISTULA;  Surgeon: Lanis Fonda BRAVO, MD;  Location: [facility] OR;  Service: Vascular;  Laterality: Left;   RIGHT/LEFT HEART CATH AND CORONARY ANGIOGRAPHY N/A 02/18/2022   Procedure: RIGHT/LEFT HEART CATH AND CORONARY ANGIOGRAPHY;  Surgeon: Ladona Heinz, MD;  Location: [facility] INVASIVE CV LAB;  Service: Cardiovascular;  Laterality: N/A;   VENOUS ANGIOPLASTY  01/21/2024   Procedure: VENOUS ANGIOPLASTY;  Surgeon: Serene Gaile ORN, MD;  Location: [facility];  Service: Cardiovascular;;     OBJECTIVE:  Vitals:   07/14/24 0948  BP: (!) 150/85  Pulse: 95  Resp: 20  SpO2: 95%    General appearance: alert; no distress Oropharynx: moist Abdomen: soft Back: no CVA tenderness Skin: warm; dry Neurologic: normal gait Psychological: alert and cooperative; normal mood and affect  Labs Reviewed: Results for orders placed or performed during the hospital encounter of 07/09/24  CBG monitoring, ED   Collection Time: 07/09/24  2:32 AM  Result Value Ref Range   Glucose-Capillary 73 70 - 99 mg/dL  Lipase, blood  Collection Time: 07/09/24  3:30 AM  Result Value Ref Range   Lipase 19 11 - 51 U/L  Comprehensive metabolic panel   Collection Time: 07/09/24  3:30 AM  Result Value Ref Range   Sodium 138 135 - 145 mmol/L   Potassium 3.9 3.5 - 5.1 mmol/L   Chloride 94 (L) 98 - 111 mmol/L   CO2 26 22 - 32 mmol/L   Glucose, Bld 87 70 - 99 mg/dL   BUN 31 (H) 6 - 20 mg/dL   Creatinine, Ser  0.81 (H) 0.61 - 1.24 mg/dL   Calcium  8.8 (L) 8.9 - 10.3 mg/dL   Total Protein 7.5 6.5 - 8.1 g/dL   Albumin  4.1 3.5 - 5.0 g/dL   AST 29 15 - 41 U/L   ALT 16 0 - 44 U/L   Alkaline Phosphatase 65 38 - 126 U/L   Total Bilirubin 0.7 0.0 - 1.2 mg/dL   GFR, Estimated 6 (L) >60 mL/min   Anion gap 18 (H) 5 - 15  CBC   Collection Time: 07/09/24  3:30 AM  Result Value Ref Range   WBC 9.8 4.0 - 10.5 K/uL   RBC 3.62 (L) 4.22 - 5.81 MIL/uL   Hemoglobin 12.2 (L) 13.0 - 17.0 g/dL   HCT 63.3 (L) 60.9 - 47.9 %   MCV 101.1 (H) 80.0 - 100.0 fL   MCH 33.7 26.0 - 34.0 pg   MCHC 33.3 30.0 - 36.0 g/dL   RDW 85.7 88.4 - 84.4 %   Platelets 107 (L) 150 - 400 K/uL   nRBC 0.0 0.0 - 0.2 %  C Difficile Quick Screen w PCR reflex   Collection Time: 07/09/24  3:41 AM   Specimen: STOOL  Result Value Ref Range   C Diff antigen NEGATIVE NEGATIVE   C Diff toxin NEGATIVE NEGATIVE   C Diff interpretation No C. difficile detected.    Labs Reviewed - No data to display  Imaging: No results found.  Allergies[1]                                             Past Medical History:  Diagnosis Date   CHF (congestive heart failure) (HCC)    Congenital renal agenesis, unilateral    Hypertension    Unintentional weight loss    Social History   Socioeconomic History   Marital status: Divorced    Spouse name: Not on file   Number of children: 1   Years of education: Not on file   Highest education level: Not on file  Occupational History   Occupation: Landscaper  Tobacco Use   Smoking status: Former    Types: Cigars    Quit date: 04/2021    Years since quitting: 3.2   Smokeless tobacco: Never  Vaping Use   Vaping status: Never Used  Substance and Sexual Activity   Alcohol use: Yes    Comment: occ   Drug use: Not Currently    Types: Marijuana   Sexual activity: Yes  Other Topics Concern   Not on file  Social History Narrative   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 His father passed in March and his  mother moved into a nursing facility, and unfortunately he lost ownership of his family's house so has been experiencing housing instability since early summer. Occasionally resides with friend, also gets services from [facility]. Has slept  outside as well.    Social Drivers of Health   Tobacco Use: Medium Risk (07/14/2024)   Patient History    Smoking Tobacco Use: Former    Smokeless Tobacco Use: Never    Passive Exposure: Not on file  Financial Resource Strain: Low Risk (05/21/2022)   Overall Financial Resource Strain (CARDIA)    Difficulty of Paying Living Expenses: Not very hard  Food Insecurity: No Food Insecurity (08/25/2022)   Hunger Vital Sign    Worried About Running Out of Food in the Last Year: Never true    Ran Out of Food in the Last Year: Never true  Transportation Needs: No Transportation Needs (08/25/2022)   PRAPARE - Administrator, Civil Service (Medical): No    Lack of Transportation (Non-Medical): No  Physical Activity: Not on file  Stress: Not on file  Social Connections: Not on file  Intimate Partner Violence: Not At Risk (08/25/2022)   Humiliation, Afraid, Rape, and Kick questionnaire    Fear of Current or Ex-Partner: No    Emotionally Abused: No    Physically Abused: No    Sexually Abused: No  Depression (PHQ2-9): Low Risk (03/22/2024)   Depression (PHQ2-9)    PHQ-2 Score: 0  Alcohol Screen: Not on file  Housing: Low Risk (08/25/2022)   Housing    Last Housing Risk Score: 0  Utilities: Not At Risk (08/25/2022)   AHC Utilities    Threatened with loss of utilities: No  Health Literacy: Not on file   Family History  Problem Relation Age of Onset   Stroke Mother    Dementia Mother    Kidney disease Mother        [1]  Allergies Allergen Reactions   Nsaids Other (See Comments)    Patient was born with only 1 kidney and was told to not take NSAID(s)     Rolinda Rogue, MD 07/14/24 1134  "

## 2024-07-14 NOTE — ED Triage Notes (Signed)
 PT  reports on going diarrhea for 1-2 weeks . Pt went to PCP last week but the medication ,Lomotil  has not relieved diarrhea. . Pt is on dialysis . Pt had stool collected and had blood work drawn at the ED Sunday morning.

## 2024-07-15 ENCOUNTER — Encounter

## 2024-07-17 ENCOUNTER — Telehealth (HOSPITAL_COMMUNITY): Payer: Self-pay | Admitting: Internal Medicine

## 2024-07-17 LAB — C DIFFICILE QUICK SCREEN W PCR REFLEX
C Diff antigen: NEGATIVE
C Diff interpretation: NOT DETECTED
C Diff toxin: NEGATIVE

## 2024-07-17 NOTE — Telephone Encounter (Signed)
 Telephone encounter created to order stool studies. See previous encounter with Dr. Rolinda.

## 2024-07-18 ENCOUNTER — Ambulatory Visit: Payer: Self-pay

## 2024-07-18 LAB — GASTROINTESTINAL PANEL BY PCR, STOOL (REPLACES STOOL CULTURE)

## 2024-07-18 MED ORDER — TINIDAZOLE 500 MG PO TABS: 2.0000 g | ORAL_TABLET | Freq: Once | ORAL | 0 refills | Status: AC

## 2024-07-18 NOTE — Telephone Encounter (Signed)
 RX sent for tinidazole  given positive giardia. Administer after dialysis

## 2024-07-19 ENCOUNTER — Encounter (HOSPITAL_COMMUNITY): Payer: Self-pay

## 2024-07-20 ENCOUNTER — Ambulatory Visit: Admitting: Internal Medicine

## 2024-07-20 ENCOUNTER — Encounter: Payer: Self-pay | Admitting: Internal Medicine

## 2024-07-20 ENCOUNTER — Other Ambulatory Visit

## 2024-07-20 VITALS — BP 128/82 | HR 95 | Ht 68.0 in | Wt 136.2 lb

## 2024-07-20 DIAGNOSIS — A071 Giardiasis [lambliasis]: Secondary | ICD-10-CM

## 2024-07-20 DIAGNOSIS — R933 Abnormal findings on diagnostic imaging of other parts of digestive tract: Secondary | ICD-10-CM

## 2024-07-20 DIAGNOSIS — Z1211 Encounter for screening for malignant neoplasm of colon: Secondary | ICD-10-CM

## 2024-07-20 DIAGNOSIS — K219 Gastro-esophageal reflux disease without esophagitis: Secondary | ICD-10-CM | POA: Diagnosis not present

## 2024-07-20 DIAGNOSIS — R197 Diarrhea, unspecified: Secondary | ICD-10-CM

## 2024-07-20 DIAGNOSIS — D649 Anemia, unspecified: Secondary | ICD-10-CM

## 2024-07-20 DIAGNOSIS — R1013 Epigastric pain: Secondary | ICD-10-CM

## 2024-07-20 LAB — B12 AND FOLATE PANEL
Folate: 9.5 ng/mL
Vitamin B-12: 1030 pg/mL — ABNORMAL HIGH (ref 211–911)

## 2024-07-20 LAB — IBC + FERRITIN
Ferritin: 510.6 ng/mL — ABNORMAL HIGH (ref 22.0–322.0)
Iron: 74 ug/dL (ref 42–165)
Saturation Ratios: 51.3 % — ABNORMAL HIGH (ref 20.0–50.0)
TIBC: 144.2 ug/dL — ABNORMAL LOW (ref 250.0–450.0)
Transferrin: 103 mg/dL — ABNORMAL LOW (ref 212.0–360.0)

## 2024-07-20 MED ORDER — TINIDAZOLE 500 MG PO TABS: 2.0000 g | ORAL_TABLET | Freq: Every day | ORAL | 0 refills | Status: AC

## 2024-07-20 MED ORDER — NA SULFATE-K SULFATE-MG SULF 17.5-3.13-1.6 GM/177ML PO SOLN: 1.0000 | Freq: Once | ORAL | 0 refills | Status: AC

## 2024-07-20 NOTE — Progress Notes (Signed)
 "  Chief Complaint: Colon cancer screening  HPI : 59 year old male with history of ESRD on HD and HFpEF (EF 45-50%) presents to discuss getting a colonoscopy for colon cancer screening.  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 He hasn't been treated for Giardia yet and was not aware of this diagnosis yet. He is being evaluated for a kidney transplant. He has been having severe issues with diarrhea for the last 2 weeks. Endorses eating bland foods such as rice. It doesn't seem to matter what he eats, he still has had diarrhea. He had to buy Depends because the diarrhea comes on so fast. Endorses 3-5 BMs per day. He has had the chills. Denies blood in the stools. Endorses a small cough. He has lost about 5-8 lbs with all of this diarrhea. He did have N&V this past Saturday. Denies other N&V. Denies hematemesis. Denies dysphagia. He has some acid reflux issues with chest burning and regurgitation. Endorses epigastric ab discomfort. Denies family history of GI issues. Denies prior colonoscopy or EGD. He no longer drinks or smokes. He is working part time at [employer].  Wt Readings from Last 3 Encounters:  07/20/24 136 lb 4 oz (61.8 kg)  07/09/24 135 lb (61.2 kg)  03/22/24 140 lb 3.2 oz (63.6 kg)    Past Medical History:  Diagnosis Date   Acute hypoxic respiratory failure (HCC) 05/18/2022   CHF (congestive heart failure) (HCC)    Chronic systolic heart failure (HCC)    CKD (chronic kidney disease) stage 5, GFR less than 15 ml/min (HCC) 05/22/2021   Congenital renal agenesis, unilateral    End stage renal disease on dialysis (HCC)    Hypertension    Unintentional weight loss      Past Surgical History:  Procedure Laterality Date   A/V SHUNT INTERVENTION Left 01/21/2024   Procedure: A/V SHUNT INTERVENTION;  Surgeon: Serene Gaile ORN, MD;  Location: [facility];  Service: Cardiovascular;   Laterality: Left;   AV FISTULA PLACEMENT Left 05/13/2021   Procedure: ARTERIOVENOUS (AV) FISTULA CREATION;  Surgeon: Lanis Fonda BRAVO, MD;  Location: [facility] OR;  Service: Vascular;  Laterality: Left;   AV FISTULA PLACEMENT Left 05/20/2022   Procedure: LEFT ARM BASILIC VEIN TRANSPOSITION;  Surgeon: Eliza Lonni RAMAN, MD;  Location: [facility] OR;  Service: Vascular;  Laterality: Left;   CAPD INSERTION N/A 04/10/2022   Procedure: LAPAROSCOPIC INSERTION CONTINUOUS AMBULATORY PERITONEAL DIALYSIS  (CAPD) CATHETER WITH OMENTOPEXY;  Surgeon: Magda Debby SAILOR, MD;  Location: [facility] OR;  Service: Vascular;  Laterality: N/A;   CAPD INSERTION  05/20/2022   Procedure: PERITONEAL DIALYSIS CATHETER REMOVAL;  Surgeon: Eliza Lonni RAMAN, MD;  Location: [facility] OR;  Service: Vascular;;   IR FLUORO GUIDE CV LINE RIGHT  07/15/2021   IR US  GUIDE VASC ACCESS RIGHT  07/16/2021   REVISON OF ARTERIOVENOUS FISTULA Left 08/25/2022   Procedure: EVACUATION HEMATOMA WITH PRIMARY REPAIR OF LEFT UPPER ARM ARTERIOVENOUS FISTULA;  Surgeon: Lanis Fonda BRAVO, MD;  Location: [facility] OR;  Service: Vascular;  Laterality: Left;   RIGHT/LEFT HEART CATH AND CORONARY ANGIOGRAPHY N/A 02/18/2022   Procedure: RIGHT/LEFT HEART CATH AND CORONARY ANGIOGRAPHY;  Surgeon: Ladona Heinz, MD;  Location: [facility];  Service: Cardiovascular;  Laterality: N/A;   VENOUS ANGIOPLASTY  01/21/2024   Procedure: VENOUS ANGIOPLASTY;  Surgeon: Serene Gaile ORN, MD;  Location: [facility];  Service: Cardiovascular;;   Family History  Problem Relation Age of Onset   Stroke Mother    Dementia Mother    Kidney disease Mother    Social History[1] Current Outpatient Medications  Medication Sig Dispense Refill   amLODipine  (NORVASC ) 10 MG tablet Take 1 tablet (10 mg total) by mouth daily. 90 tablet 3   carvedilol  (COREG ) 25 MG tablet Take 1 tablet (25 mg total) by mouth 2 (two) times daily. Hold if systolic blood pressure (top number) is less than 100 and/or heart rate is less  than 55. 180 tablet 3   diphenoxylate -atropine  (LOMOTIL ) 2.5-0.025 MG tablet Take 2 tablets by mouth 4 (four) times daily as needed for diarrhea or loose stools. 30 tablet 0   losartan  (COZAAR ) 25 MG tablet Take 1 tablet (25 mg total) by mouth daily. 90 tablet 1   SENSIPAR  30 MG tablet Take 1 tablet (30 mg total) by mouth daily with supper. (Patient not taking: Reported on 07/20/2024) 60 tablet 1   sucroferric oxyhydroxide (VELPHORO ) 500 MG chewable tablet Chew 500 mg by mouth 3 (three) times daily with meals. (Patient not taking: Reported on 07/20/2024)     No current facility-administered medications for this visit.   Allergies[2]   Review of Systems: All systems reviewed and negative except where noted in HPI.   Physical Exam: BP 128/82   Pulse 95   Ht 5' 8 (1.727 m)   Wt 136 lb 4 oz (61.8 kg)   BMI 20.72 kg/m  Constitutional: Pleasant,well-developed, male in no acute distress. HEENT: Normocephalic and atraumatic. Conjunctivae are normal. No scleral icterus. Cardiovascular: Normal rate, regular rhythm.  Pulmonary/chest: Effort normal and breath sounds normal. No wheezing, rales or rhonchi. Abdominal: Soft, nondistended, nontender. Bowel sounds active throughout. There are no masses palpable. No hepatomegaly. Extremities: No edema Neurological: Alert and oriented to person place and time. Skin: Skin is warm and dry. No rashes noted. Psychiatric: Normal mood and affect. Behavior is normal.  Labs 06/2024: CBC with low Hb of 12.2 and low plts of 107. CMP with elevated BUN and Cr as expected in ESRD. Lipase nml.   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 C dif was negative.  CT renal stone study 10/15/21: IMPRESSION: No urinary tract calculi or hydronephrosis. Circumferential bladder wall thickening is probably due to under distension. Correlate for cystitis. Question of wall thickening along the proximal descending colon, which could be due to under distension.  However, there is a mild infiltration of the adjacent fat raising possibility of colitis  CT A/p w/o contrast 02/05/23: 1.  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  2.  No calcific atherosclerosis.   ASSESSMENT AND PLAN: Colon cancer screening Diarrhea Thickening of proximal descending colon on CT scan Anemia, perhaps related to ESRD Epigastric ab discomfort Giardia infection GERD Patient presents to discuss getting a colonoscopy for colon cancer screening. He has never had a colonoscopy in the past. Recently he has had two weeks of diarrheal symptoms, which are likely due to Giardia infection. Since he has not been treated yet, will give him a dose of tinidazole . Will check some labs to evaluate for anemia. Will plan for colonoscopy for colon cancer screening and EGD to screening for Barrett's esophagus in the setting of GERD as well as some epigastric ab discomfort. I went over the risks (including bleeding, perforation, infection, and changes in vital changes) and benefits of the EGD/colonoscopy procedures, and the patient wishes to proceed with scheduling -  Check ferritin/IBC, vitamin B12, folate - Tinidazole  2 g. Patient will take this medication later today. He just had HD this morning - EGD/colonoscopy at [facility] due to ESRD on HD  Estefana Kidney, MD  I spent 46 minutes of time, including in depth chart review, independent review of results as outlined above, communicating results with the patient directly, face-to-face time with the patient, coordinating care, ordering studies and medications as appropriate, and documentation.      [1]  Social History Tobacco Use   Smoking status: Former    Types: Cigars    Quit date: 04/2021    Years since quitting: 3.2   Smokeless tobacco: Never  Vaping Use   Vaping status: Never Used  Substance Use Topics   Alcohol use: Yes    Comment: occ   Drug use: Not Currently    Types: Marijuana  [2]  Allergies Allergen  Reactions   Nsaids Other (See Comments)    Patient was born with only 1 kidney and was told to not take NSAID(s)   "

## 2024-07-20 NOTE — Patient Instructions (Addendum)
 You have been scheduled for an endoscopy and colonoscopy at  [facility] on 08-11-24. Please follow the written instructions given to you at your visit today.  If you use inhalers (even only as needed), please bring them with you on the day of your procedure.  DO NOT TAKE 7 DAYS PRIOR TO TEST- Trulicity (dulaglutide) Ozempic, Wegovy (semaglutide) Mounjaro, Zepbound (tirzepatide) Bydureon Bcise (exanatide extended release)  DO NOT TAKE 1 DAY PRIOR TO YOUR TEST Rybelsus (semaglutide) Adlyxin (lixisenatide) Victoza (liraglutide) Byetta (exanatide) ___________________________________________________________________________   Please go to the lab in the basement of our building to have lab work done as you leave today. Hit B for basement when you get on the elevator.  When the doors open the lab is on your left.  We will call you with the results. Thank you.   We have sent the following medications to your pharmacy for you to pick up at your convenience: Tinidazole   Thank you for entrusting me with your care and for choosing [facility], Dr. Estefana Kidney  _______________________________________________________  If your blood pressure at your visit was 140/90 or greater, please contact your primary care physician to follow up on this.  _______________________________________________________  If you are age 43 or older, your body mass index should be between 23-30. Your Body mass index is 20.72 kg/m. If this is out of the aforementioned range listed, please consider follow up with your Primary Care Provider.  If you are age 92 or younger, your body mass index should be between 19-25. Your Body mass index is 20.72 kg/m. If this is out of the aformentioned range listed, please consider follow up with your Primary Care Provider.   ________________________________________________________  The [facility] providers would like to encourage you to use MYCHART to communicate with  providers for non-urgent requests or questions.  Due to long hold times on the telephone, sending your provider a message by [facility] may be a faster and more efficient way to get a response.  Please allow 48 business hours for a response.  Please remember that this is for non-urgent requests.  _______________________________________________________  [name] is using a team-based approach to care.  Your team is made up of your doctor and two to three APPS. Our APPS (Nurse Practitioners and Physician Assistants) work with your physician to ensure care continuity for you. They are fully qualified to address your health concerns and develop a treatment plan. They communicate directly with your gastroenterologist to care for you. Seeing the Advanced Practice Practitioners on your physician's team can help you by facilitating care more promptly, often allowing for earlier appointments, access to diagnostic testing, procedures, and other specialty referrals.    Due to recent changes in healthcare laws, you may see the results of your imaging and laboratory studies on MyChart before your provider has had a chance to review them.  We understand that in some cases there may be results that are confusing or concerning to you. Not all laboratory results come back in the same time frame and the provider may be waiting for multiple results in order to interpret others.  Please give us  48 hours in order for your provider to thoroughly review all the results before contacting the office for clarification of your results.

## 2024-07-21 ENCOUNTER — Ambulatory Visit: Payer: Self-pay | Admitting: Internal Medicine

## 2024-07-25 ENCOUNTER — Encounter (HOSPITAL_COMMUNITY): Admission: RE | Payer: Self-pay | Source: Home / Self Care

## 2024-07-25 ENCOUNTER — Ambulatory Visit (HOSPITAL_COMMUNITY): Admission: RE | Admit: 2024-07-25 | Source: Home / Self Care | Admitting: Vascular Surgery

## 2024-07-29 ENCOUNTER — Encounter: Admitting: Gastroenterology

## 2024-08-11 ENCOUNTER — Ambulatory Visit (HOSPITAL_COMMUNITY): Admit: 2024-08-11 | Admitting: Internal Medicine

## 2024-08-11 ENCOUNTER — Encounter (HOSPITAL_COMMUNITY): Payer: Self-pay
